# Patient Record
Sex: Male | Born: 1990 | Race: White | Hispanic: No | Marital: Married | Smoking: Former smoker
Health system: Southern US, Community
[De-identification: ages and names within clinical notes are randomized; demographics above are authoritative.]

## PROBLEM LIST (undated history)

## (undated) DIAGNOSIS — M6282 Rhabdomyolysis: Secondary | ICD-10-CM

## (undated) DIAGNOSIS — Z8639 Personal history of other endocrine, nutritional and metabolic disease: Secondary | ICD-10-CM

## (undated) DIAGNOSIS — F32A Depression, unspecified: Secondary | ICD-10-CM

## (undated) DIAGNOSIS — I10 Essential (primary) hypertension: Secondary | ICD-10-CM

## (undated) DIAGNOSIS — F329 Major depressive disorder, single episode, unspecified: Secondary | ICD-10-CM

## (undated) DIAGNOSIS — T50901A Poisoning by unspecified drugs, medicaments and biological substances, accidental (unintentional), initial encounter: Secondary | ICD-10-CM

## (undated) DIAGNOSIS — R579 Shock, unspecified: Secondary | ICD-10-CM

## (undated) DIAGNOSIS — R569 Unspecified convulsions: Secondary | ICD-10-CM

## (undated) DIAGNOSIS — J9690 Respiratory failure, unspecified, unspecified whether with hypoxia or hypercapnia: Secondary | ICD-10-CM

## (undated) DIAGNOSIS — F419 Anxiety disorder, unspecified: Secondary | ICD-10-CM

## (undated) DIAGNOSIS — I214 Non-ST elevation (NSTEMI) myocardial infarction: Secondary | ICD-10-CM

## (undated) DIAGNOSIS — J189 Pneumonia, unspecified organism: Secondary | ICD-10-CM

## (undated) DIAGNOSIS — G4730 Sleep apnea, unspecified: Secondary | ICD-10-CM

## (undated) DIAGNOSIS — Z789 Other specified health status: Secondary | ICD-10-CM

## (undated) DIAGNOSIS — F909 Attention-deficit hyperactivity disorder, unspecified type: Secondary | ICD-10-CM

## (undated) HISTORY — DX: Other specified health status: Z78.9

## (undated) HISTORY — PX: NO PAST SURGERIES: SHX2092

## (undated) HISTORY — DX: Sleep apnea, unspecified: G47.30

## (undated) HISTORY — DX: Depression, unspecified: F32.A

## (undated) HISTORY — DX: Attention-deficit hyperactivity disorder, unspecified type: F90.9

## (undated) HISTORY — DX: Anxiety disorder, unspecified: F41.9

## (undated) HISTORY — PX: MANDIBLE FRACTURE SURGERY: SHX706

---
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
## 2011-04-08 ENCOUNTER — Inpatient Hospital Stay (HOSPITAL_COMMUNITY)
Admission: EM | Admit: 2011-04-08 | Discharge: 2011-04-10 | DRG: 918 | Disposition: A | Discharge status: Home / Self Care | Payer: Medicaid Other | Attending: Internal Medicine | Admitting: Internal Medicine

## 2011-04-08 ENCOUNTER — Inpatient Hospital Stay (HOSPITAL_COMMUNITY): Payer: Medicaid Other

## 2011-04-08 DIAGNOSIS — F1010 Alcohol abuse, uncomplicated: Secondary | ICD-10-CM | POA: Diagnosis present

## 2011-04-08 DIAGNOSIS — F1210 Cannabis abuse, uncomplicated: Secondary | ICD-10-CM | POA: Diagnosis present

## 2011-04-08 DIAGNOSIS — E876 Hypokalemia: Secondary | ICD-10-CM | POA: Diagnosis present

## 2011-04-08 DIAGNOSIS — R4182 Altered mental status, unspecified: Secondary | ICD-10-CM | POA: Diagnosis present

## 2011-04-08 DIAGNOSIS — F17200 Nicotine dependence, unspecified, uncomplicated: Secondary | ICD-10-CM | POA: Diagnosis present

## 2011-04-08 DIAGNOSIS — R000 Tachycardia, unspecified: Secondary | ICD-10-CM | POA: Diagnosis present

## 2011-04-08 DIAGNOSIS — F1110 Opioid abuse, uncomplicated: Secondary | ICD-10-CM | POA: Diagnosis present

## 2011-04-08 DIAGNOSIS — R45851 Suicidal ideations: Secondary | ICD-10-CM

## 2011-04-08 DIAGNOSIS — T443X1A Poisoning by other parasympatholytics [anticholinergics and antimuscarinics] and spasmolytics, accidental (unintentional), initial encounter: Principal | ICD-10-CM | POA: Diagnosis present

## 2011-04-08 LAB — COMPREHENSIVE METABOLIC PANEL
Albumin: 3.9 g/dL (ref 3.5–5.2)
BUN: 12 mg/dL (ref 6–23)
Calcium: 9.1 mg/dL (ref 8.4–10.5)
GFR calc Af Amer: >60 mL/min (ref >60)
Glucose, Bld: 127 mg/dL — ABNORMAL HIGH (ref 70–99)
Total Protein: 7.1 g/dL (ref 6.0–8.3)

## 2011-04-08 LAB — DIFFERENTIAL
Eosinophils Absolute: 0.3 10*3/uL (ref 0.0–0.7)
Lymphs Abs: 3.4 10*3/uL (ref 0.7–4.0)
Monocytes Absolute: 1.4 10*3/uL — ABNORMAL HIGH (ref 0.1–1.0)
Monocytes Relative: 14 % — ABNORMAL HIGH (ref 3–12)
Neutrophils Relative %: 47 % (ref 43–77)

## 2011-04-08 LAB — RAPID URINE DRUG SCREEN, HOSP PERFORMED
Amphetamines: NOT DETECTED
Barbiturates: NOT DETECTED
Cocaine: NOT DETECTED
Opiates: NOT DETECTED
Tetrahydrocannabinol: POSITIVE — AB

## 2011-04-08 LAB — CBC
Hemoglobin: 14.1 g/dL (ref 13.0–17.0)
MCH: 27.2 pg (ref 26.0–34.0)
MCHC: 35 g/dL (ref 30.0–36.0)
MCV: 77.6 fL — ABNORMAL LOW (ref 78.0–100.0)
Platelets: 275 10*3/uL (ref 150–400)
RBC: 5.19 MIL/uL (ref 4.22–5.81)

## 2011-04-08 LAB — URINALYSIS, ROUTINE W REFLEX MICROSCOPIC
Bilirubin Urine: NEGATIVE
Glucose, UA: NEGATIVE mg/dL
Hgb urine dipstick: NEGATIVE
Ketones, ur: NEGATIVE mg/dL
Protein, ur: NEGATIVE mg/dL
Urobilinogen, UA: 1 mg/dL (ref 0.0–1.0)

## 2011-04-08 LAB — MRSA PCR SCREENING: MRSA by PCR: NEGATIVE

## 2011-04-08 LAB — CK: Total CK: 708 U/L — ABNORMAL HIGH (ref 7–232)

## 2011-04-08 LAB — SALICYLATE LEVEL: Salicylate Lvl: <2 mg/dL — ABNORMAL LOW (ref 2.8–20.0)

## 2011-04-08 LAB — ETHANOL: Alcohol, Ethyl (B): <11 mg/dL (ref 0–11)

## 2011-04-09 LAB — COMPREHENSIVE METABOLIC PANEL
ALT: 14 U/L (ref 0–53)
AST: 24 U/L (ref 0–37)
Albumin: 3.6 g/dL (ref 3.5–5.2)
Calcium: 9.3 mg/dL (ref 8.4–10.5)
GFR calc Af Amer: >60 mL/min (ref >60)
Glucose, Bld: 105 mg/dL — ABNORMAL HIGH (ref 70–99)
Sodium: 139 mEq/L (ref 135–145)
Total Protein: 6.9 g/dL (ref 6.0–8.3)

## 2011-04-09 LAB — CBC
MCH: 26.8 pg (ref 26.0–34.0)
MCHC: 34.2 g/dL (ref 30.0–36.0)
Platelets: 341 10*3/uL (ref 150–400)
RDW: 13.6 % (ref 11.5–15.5)

## 2011-04-10 ENCOUNTER — Emergency Department (HOSPITAL_COMMUNITY)
Admission: EM | Admit: 2011-04-10 | Discharge: 2011-04-11 | Disposition: A | Discharge status: Other Acute Inpatient Hospital | Payer: Medicaid Other | Attending: Emergency Medicine | Admitting: Emergency Medicine

## 2011-04-10 DIAGNOSIS — R404 Transient alteration of awareness: Secondary | ICD-10-CM | POA: Insufficient documentation

## 2011-04-10 DIAGNOSIS — Z046 Encounter for general psychiatric examination, requested by authority: Secondary | ICD-10-CM | POA: Insufficient documentation

## 2011-04-10 DIAGNOSIS — F4310 Post-traumatic stress disorder, unspecified: Secondary | ICD-10-CM

## 2011-04-10 LAB — DIFFERENTIAL
Eosinophils Relative: 1 % (ref 0–5)
Lymphs Abs: 3 10*3/uL (ref 0.7–4.0)
Monocytes Relative: 12 % (ref 3–12)
Smear Review: ADEQUATE

## 2011-04-10 LAB — CBC
Hemoglobin: 16.3 g/dL (ref 13.0–17.0)
MCH: 26.2 pg (ref 26.0–34.0)
RBC: 6.21 MIL/uL — ABNORMAL HIGH (ref 4.22–5.81)

## 2011-04-10 LAB — BASIC METABOLIC PANEL
CO2: 27 mEq/L (ref 19–32)
Chloride: 100 mEq/L (ref 96–112)
Sodium: 136 mEq/L (ref 135–145)

## 2011-04-10 LAB — RAPID URINE DRUG SCREEN, HOSP PERFORMED
Amphetamines: NOT DETECTED
Benzodiazepines: POSITIVE — AB
Cocaine: NOT DETECTED
Opiates: POSITIVE — AB
Tetrahydrocannabinol: NOT DETECTED

## 2011-04-10 LAB — ETHANOL: Alcohol, Ethyl (B): <11 mg/dL (ref 0–11)

## 2011-04-10 NOTE — Discharge Summary (Signed)
  NAMEJARET, [name]                  ACCOUNT NO.:  [id number]  MEDICAL RECORD NO.:  [id number]  LOCATION:  5522                         FACILITY:  [facility]  PHYSICIAN:  Jonny Ruiz, MD    DATE OF BIRTH:  1991/01/22  DATE OF ADMISSION:  04/08/2011 DATE OF DISCHARGE:  04/10/2011                              DISCHARGE SUMMARY   DISCHARGE DIAGNOSES: 1. Drug overdose. 2. Polysubstance abuse, likely anticholinergic toxicity. 3. Mild hypokalemia.  TESTS AND PROCEDURES: 1. CT scan of the head, noncontrast, normal. 2. Chest x-ray negative. 3. Urine drug screen positive for marijuana. 4. TSH normal. 5. Total CK 708.  REASON FOR HOSPITALIZATION:  For complete details of admission, please refer to the history and physical.  Briefly, the patient is a 20 year old with a history of polysubstance abuse, including marihuana, heroin,alcohol and benzos who was admitted to the hospital for drug overdose, likely anticholinergic toxicity.  HOSPITAL COURSE:  The patient was extremely agitated and with severe hypertension and was having some suicidal ideas.  He was admitted to the step-down unit and he was placed on restraint and suicidal precautions. He was managed with benzodiazepine.  He had a potassium of 3.3 and this was replaced.  The patient eventually calmed down and his blood pressure normalized.  He was then transferred to a regular medical floor where he was evaluated about his substance abuse by a counsellor as well as psychiatry.  He was offered the option to get detox at [facility] and then be transferred to [facility].  The patient refused and wanted to go home and from there, he wants to go to [facility].  He was told by the psychiatrist that it was going to be very difficult for him to deal with the withdrawal symptoms and the best option was to go to [facility] and then [facility] but the patient did not want to be admitted in Behavioral Health at this time and psychiatry did not  feel that involuntary commitment was appropriate.  The patient will be released to home, where he lives with his mother.  DISCHARGE CONDITION:  Stable.  DISCHARGE MEDICATIONS:  None.          ______________________________ Jonny Ruiz, MD     GL/MEDQ  D:  04/10/2011  T:  04/10/2011  Job:  [id number]  Electronically Signed by Jonny Ruiz MD on 04/10/2011 04:59:05 PM

## 2011-04-10 NOTE — Consult Note (Signed)
[name], [name]                  ACCOUNT NO.:  [id number]  MEDICAL RECORD NO.:  [id number]  LOCATION:  5522                         FACILITY:  [facility]  PHYSICIAN:  Eulogio Ditch, MD DATE OF BIRTH:  05/26/1991  DATE OF CONSULTATION:  04/10/2011 DATE OF DISCHARGE:                                CONSULTATION   REASON FOR CONSULT:  Polysubstance abuse, suicidal ideations.  HISTORY OF PRESENT ILLNESS:  A 20 year old male with a history of heroin abuse since the age of 53.  The patient reported he uses heroin almost every day.  At a maximum, he has used is up to half a gram.  The patient also injects heroin whenever he can get it.  The patient also reported abusing cocaine and alcohol on and off.  The patient was on methadone program, but he could not afford the program as he was not having transportation to go to the service.  The patient reported depressed mood, but denied suicidal ideations.  The patient is currently not in any psychiatric or counseling in the outpatient setting.  The patient denied any history of suicide attempt in the past.  The patient is logical and goal directed during the interview.  Denies hearing voices, but reported sometimes he hears a voice of the father yelling on him.  The patient also has gone to the jail because of the drug issue and taking probation in the past.  CURRENT MEDICAL HISTORY:  The patient has no active medical issue.  ALLERGIES:  No known drug allergies.  SOCIAL HISTORY:  The patient is currently living with the mother and has a 28-month-old baby boy.  FAMILY PSYCH HISTORY:  The patient's mother has a history of bipolar and drug abuse and she was admitted in [facility] and [facility] in the past.  MENTAL STATUS EXAM:  The patient is calm, cooperative during interview. Fair eye contact.  Speech normal in rate, rhythm, and volume.  Mood anxious.  Affect, mood congruent.  Thought process logical and goal directed.  Not suicidal or  homicidal, not delusional, not internally preoccupied, very logical and redirectable during the interview.  Denies hearing command or hallucinations.  On asking about suicidal ideation and how he was brought to the hospital, the patient reported that he took two big pills of Seroquel, which he helped the stranger to get prescription from the pharmacy as he paid the $3 co-pay, and he was unable to sleep and that is why he took 2 pills as the stranger gave him few pills because he helped him in getting the prescription.  On asking how he know that Seroquel is used for insomnia, he told us that when he was in the prison, that medication was given for insomnia and his mother also uses it for insomnia.  Cognition:  Alert, awake, oriented x3. Memory:  Immediate, recent, remote fair.  Attention and concentration fair.  Abstraction ability fair.  Insight and judgment fair.  DIAGNOSES:  Axis I:  Polysubstance dependence, mood disorder, not otherwise specified. Axis II:  Deferred. Axis III:  See medical notes. Axis IV:  Chronic substance abuse. Axis V:  50.  RECOMMENDATIONS: 1. Different treatment options discussed with  the patient.  I gave the     option to the patient to get detox at [facility] and then he     can be transferred to [facility], but not for inpatient rehab.  The     patient at this time wants to go home and from there, he wants to     go to the [facility].  He told us that he went one time in the past, but     as his girlfriend was pregnant, he did not give the consent for     admission and he left from there with the mother.  Again, I told     him that it is very difficult for him to deal with the withdrawal     symptoms, so the best option for him is to go to [facility]     and from there to [facility], but the patient do not want to be admitted     at [facility] at this time. 2. As this patient do not meet criteria for IVC, I will not admit the     patient forcefully  at [facility].  The patient will be given appointment for the [facility] as he wants to go home and then go to [facility].  Thanks for involving me in taking care of this patient.     Eulogio Ditch, MD     SA/MEDQ  D:  04/10/2011  T:  04/10/2011  Job:  [id number]  Electronically Signed by Eulogio Ditch  on 04/10/2011 03:32:37 PM

## 2011-04-11 ENCOUNTER — Inpatient Hospital Stay (HOSPITAL_COMMUNITY)
Admission: RE | Admit: 2011-04-11 | Discharge: 2011-04-14 | DRG: 897 | Disposition: A | Discharge status: Home / Self Care | Payer: PRIVATE HEALTH INSURANCE | Source: Ambulatory Visit | Attending: Psychiatry | Admitting: Psychiatry

## 2011-04-11 DIAGNOSIS — Z6379 Other stressful life events affecting family and household: Secondary | ICD-10-CM

## 2011-04-11 DIAGNOSIS — Z5900 Homelessness unspecified: Secondary | ICD-10-CM

## 2011-04-11 DIAGNOSIS — F1120 Opioid dependence, uncomplicated: Principal | ICD-10-CM

## 2011-04-11 DIAGNOSIS — F1320 Sedative, hypnotic or anxiolytic dependence, uncomplicated: Secondary | ICD-10-CM

## 2011-04-11 DIAGNOSIS — D72829 Elevated white blood cell count, unspecified: Secondary | ICD-10-CM

## 2011-04-12 DIAGNOSIS — F1120 Opioid dependence, uncomplicated: Secondary | ICD-10-CM

## 2011-04-12 DIAGNOSIS — F1320 Sedative, hypnotic or anxiolytic dependence, uncomplicated: Secondary | ICD-10-CM

## 2011-04-12 LAB — COMPREHENSIVE METABOLIC PANEL
Calcium: 9.6 mg/dL (ref 8.4–10.5)
Chloride: 103 mEq/L (ref 96–112)
Creatinine, Ser: 1.04 mg/dL (ref 0.50–1.35)
GFR calc Af Amer: >60 mL/min (ref >60)
GFR calc non Af Amer: >60 mL/min (ref >60)
Potassium: 3.9 mEq/L (ref 3.5–5.1)
Sodium: 139 mEq/L (ref 135–145)

## 2011-04-12 LAB — CBC
MCH: 25.7 pg — ABNORMAL LOW (ref 26.0–34.0)
MCHC: 32.5 g/dL (ref 30.0–36.0)
Platelets: 334 10*3/uL (ref 150–400)

## 2011-04-26 NOTE — H&P (Signed)
  [name], [name]                  ACCOUNT NO.:  [id number]  MEDICAL RECORD NO.:  [id number]  LOCATION:  [facility]                         FACILITY:  [facility]  PHYSICIAN:  Houston Siren, MD           DATE OF BIRTH:  1990-10-25  DATE OF ADMISSION:  04/08/2011 DATE OF DISCHARGE:                             HISTORY & PHYSICAL   PRIMARY CARE PHYSICIAN:  None.  REASON FOR ADMISSION:  Altered mental status likely toxicity from polysubstance abuse.  ADVANCE DIRECTIVE:  Full code.  HISTORY OF PRESENT ILLNESS:  This is a 20 year old male with benign past medical history, polysubstance abuser who was using heroin, taking Seroquel and methadone, presents to the emergency room with altered mental status, agitated with muscle twitching.  In the emergency room, he was also given Cogentin and 75 mg of Benadryl in addition to his Benadryl taking prior.  He was thrashing around quite agitated, unable to give any meaningful history.  Evaluation shows that he is tachycardic and hypertensive with systolic blood pressure of 146/120 and heart rate of 150.  Further evaluation shows normal level of aspirin (less than 2) a CPK of 700, alcohol level less than 11, normal creatinine and electrolytes, normal liver function tests.  His Tylenol level is less than 15 and he has a normal INR.  Urinary drug screen showed the presence of marijuana.  Hospitalist was asked to admit him for altered mental status and agitation.  PAST MEDICAL HISTORY:  Benign.  CHRONIC MEDICATION:  He was taking Seroquel and Benadryl.  ALLERGIES:  No known drug allergies.  SOCIAL HISTORY:  Unable to complete but he is known to be a smoker, heavy drinker, use of benzodiazepine along with heroin and opiate abuse.  REVIEW OF SYSTEMS:  Unable.  PHYSICAL EXAMINATION:  VITAL SIGNS:  Blood pressure 146/122, pulse of 110 now, respiratory rate of 20, temperature 97.5. GENERAL:  He is quite agitated. HEENT:  His pupil was dilated but reactive.   He has facial symmetry with his grimace.  No stridor. CARDIAC:  S1-S2 regular and tachycardic. LUNGS:  Clear. ABDOMEN:  Soft, nondistended, nontender. EXTREMITIES:  No edema.  He is hyperreflexic bilaterally and moving all extremity. NEUROLOGICAL:  Certainly suboptimal due to his condition. PSYCHIATRIC:  Certainly suboptimal due to his condition.  OBJECTIVE FINDINGS:  As above.  IMPRESSION:  A 20 year old with polysubstance abuse, likely having anticholinergic toxicity since he is taking Seroquel, heroin, and was given Benadryl and Cogentin in the emergency room as well.  We will admit him to the step-down, may have to use restrain to protect himself. We will use benzodiazepine as needed.  We would definitely avoid phenothiazine for sedation.  He should do well with supportive care alone.  We will admit him to [facility] Team 5.  We will try to avoid physostigmine and as noted, I suspect that he will get better over time.     Houston Siren, MD     PL/MEDQ  D:  04/08/2011  T:  04/08/2011  Job:  [id number]  Electronically Signed by Houston Siren  on 04/26/2011 02:19:05 AM

## 2011-05-02 NOTE — Discharge Summary (Signed)
NAMEMERLIN, GOLDEN                  ACCOUNT NO.:  [id number]  MEDICAL RECORD NO.:  [id number]  LOCATION:  0306                          [facility]ACILITY:  [facility]  PHYSICIAN:  Orson Aloe, MD       DATE OF BIRTH:  07-08-1991  DATE OF ADMISSION:  04/11/2011 DATE OF DISCHARGE:  04/14/2011                              DISCHARGE SUMMARY   IDENTIFYING INFORMATION:  This is a 20 year old Caucasian male, single. This is a voluntary admission.  HISTORY OF PRESENT ILLNESS:  First [facility] admission for [name] who goes by the name [name], "who presented himself requesting detox from heroin." He has been using about half a gram, about $90 worth of heroin every day and is injecting it IV.  He presented earlier in the week after taking a handful of Seroquel tablets of unknown mg that were offered to him by a stranger after he gave this person $3 dollars to get his prescription filled.  He said he was planning on getting some sleep since he was trying to come off the heroin and had been unable to sleep.  He became confused, agitated and describes himself as allergic to the Seroquel. He has also been taking Ativan 1 mg every 6 hours on prescriptions he has received from the emergency room and other various places, to control his anxiety and has been on some type of by benzodiazepine since February 2012 when he got out of prison where he stayed for vehicle theft.  At that time, he had been taking Klonopin 2 mg q.6 h pretty regularly until several weeks ago when he started Ativan.  He has categorically denied any suicidal thoughts and would like to be clean from drugs.  His mother is an inspiration for him since she has been clean and sober for several years.  PAST PSYCHIATRIC HISTORY:  No previous formal treatment for substance abuse.  He was in jail from age 68 until February 2012 for felony theft of a motor vehicle and has been living between his mom's house and a girlfriend's house in various places.  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  FAMILY HISTORY:  Father with history of substance abuse.  MEDICAL EVALUATION:  He was medically evaluated in the emergency room and had earlier been treated at [facility] emergency room for delirium after the ingestion of Seroquel.  Calm, cooperative.  His initial alcohol screen was negative.  Normal chemistries:  BUN 12, creatinine 1.33.  CBC revealed a mildly elevated white count of 17.8 with no focal symptoms and a hemoglobin 16.3.  Urine drug screen was positive for opiates and benzodiazepines.  COURSE OF HOSPITALIZATION:  He was admitted to our dual diagnosis unit and given a working diagnosis of opiate abuse and dependence, rule out benzodiazepine dependence and an AXIS III of leukocytosis NOS.  He was started on clonidine detox protocol with a goal of a safe detox in 5 days, and on a Librium detox protocol for safe detox from benzodiazepines.  His participation in group and unit activities was consistently appropriate, polite and satisfactory throughout his stay here.  He did not  display any acute withdrawal symptoms, though he complained of myalgias primarily in his knees.  He repeatedly expressed his goal of wanting to be clean and sober off all drugs.  Hepatic function was checked.  SGOT 12, SGPT 15, alkaline phosphatase 57 and total bilirubin 0.5.  Repeat CBC revealed WBC 12.0, hemoglobin 13.9, hematocrit 42.8 and platelets 334,000.  He remained afebrile and without any signs of infection throughout his stay here and it was felt his leukocytosis was physiological.  By Monday, July 16, he was ready for discharge as mother agreed to transport him to the [facility] program, and he was looking forward to this.  He continued to clearly and convincingly deny any dangerous thoughts and looked forward to the [facility] program.  DISCHARGE/PLAN:  Discharged to [facility] today.  Mother will transport.  DISCHARGE  MEDICATIONS: 1. Naprosyn 500 mg b.i.d. p.r.n. for myalgias. 2. Methocarbamol 500 mg q.i.d. p.r.n. for muscle spasms. 3. Clonidine 0.1 mg b.i.d. on July 16 and 17, and 0.1 mg daily on July     18 and 19, and opiate detox will be completed. 4. Librium 25 mg one cap b.i.d. July 16 and one daily on July 17, and     benzodiazepine detox is completed.     [name], N.P.   ______________________________ Orson Aloe, MD    MAS/MEDQ  D:  04/14/2011  T:  04/14/2011  Job:  [id number]  Electronically Signed by [name] N.P. on 04/15/2011 11:26:07 AM Electronically Signed by Orson Aloe  on 05/02/2011 08:51:47 AM

## 2011-05-02 NOTE — Assessment & Plan Note (Signed)
[name], [name]                  ACCOUNT NO.:  [id number]  MEDICAL RECORD NO.:  [id number]  LOCATION:  0306                          FACILITY:  [facility]  PHYSICIAN:  Orson Aloe, MD       DATE OF BIRTH:  Aug 23, 1991  DATE OF ADMISSION:  04/11/2011 DATE OF DISCHARGE:                      PSYCHIATRIC ADMISSION ASSESSMENT   DATE OF ASSESSMENT:  April 12, 2011, at 8:00 a.m.  IDENTIFYING INFORMATION:  This is a 20 year old, single, Caucasian male. This is a voluntary admission.  HISTORY OF PRESENT ILLNESS:  Authur presents on referral from Dr. Eulogio Ditch for detox from heroin.  He has been using about 0.5 gram a day or about 90 dollars every day.  Last week he took several tablets of Seroquel which he had gotten off the street and reported that he became very confused, agitated, and ended up in the [facility] emergency room. He said that he took it because someone offered to him, he was hoping to get some sleep off it, but did not intend to kill himself.  He has also been using Ativan about 1 mg q.6 hours.  He says that he takes it regularly  on a prescription that he received from the emergency room to control his anxiety.  He has been on some type of benzodiazepine since February 2012 when he got out of prison.  He started off taking clonazepam off the street and then got a prescription for 2 mg q.6 hours which he had been taking pretty regularly until several weeks ago when he started taking Ativan which he says he has been taking under various prescription sources.  He denies any abuse of pain pills.  Denies abuse of hallucinogenics.  Last use of heroin was yesterday and on initial presentation was noted to be nodding off when they were attempting to assess him.  He is fully alert today and denies any suicidal thoughts. His mother has arranged for him to be admitted to the [facility] program in [place], [place] and he has a bed approved for 10:00 a.m. on Monday, July  16th.  PAST PSYCHIATRIC HISTORY:  No previous formal treatment for substance abuse.  He was in jail from age 66 until February 2012 for felony theft of a motor vehicle.  He has been living from [facility] and girlfriend's house and various places.  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  No current legal problems.  FAMILY HISTORY:  Father with history of substance abuse.  MEDICAL HISTORY:  No regular primary care provider.  CURRENT MEDICAL PROBLEMS:  None.  PAST MEDICAL HISTORY:  Seizure in 2010 in association with the use of naloxone no while high on heroin.  CURRENT MEDICATIONS:  Ativan 1 to 2 mg q.6 hours p.r.n. for anxiety.  ALLERGIES:  None.  He refers to Seroquel as an allergy but on exam he actually became delirious when abusing the Seroquel.  POSITIVE PHYSICAL FINDINGS:  This is a slim built, normally-developed Caucasian male, in full contact with reality.  Normal motor exam.  No abnormal movements.  Cognition fully intact.  ADMITTING VITAL SIGNS:  Temperature 98.4, pulse 105, respirations 16, blood pressure 101/67.  Subjectively he reports today that he is feeling pretty rough, experiencing some withdrawal from his Ativan.  Objectively he does not appear to be in any acute withdrawal.  DIAGNOSTIC STUDIES:  Were done in the emergency room.  Alcohol level less than 11.  Chemistry is normal.  BUN 12, creatinine 1.33.  CBC reveals mildly elevated white count of 17.8, hemoglobin 16.3, hematocrit 49.5, platelets 401,000.  Urine drug screen positive for opiates and benzodiazepines.  MENTAL STATUS EXAM:  This is a fully alert male, cooperative, pleasant, calm, composed, non-pressured speech, remote and working memory are intact.  Insight partial, superficial.  Judgment fair.  Impulse control fair.  Thinking is nonpsychotic.  Mood is neutral, denying any suicidal or homicidal thoughts.  He has plans in place to go to [facility] program. Asks appropriate  questions about the program.  AXIS I:  Opiate abuse and dependence, rule out benzodiazepine dependence. AXIS II:  Deferred. AXIS III:  Leukocytosis not otherwise specified. AXIS IV:  Significant social issues including homelessness. AXIS V:  Current 48.  Past year not known.  PLAN: 1. Voluntarily admit him with a goal of safe withdrawal. 2. We started him on an opiate detox program using clonidine and will     start him on a Librium protocol for safe withdrawal from the     benzodiazepines. 3. His white count was 17.5, but he is afebrile and completely     asymptomatic. 4. We will recheck a CBC and will check a CMET to check his liver, and     recheck his potassium.     Margaret A. Lorin Picket, N.P.   ______________________________ Orson Aloe, MD    MAS/MEDQ  D:  04/12/2011  T:  04/12/2011  Job:  [id number]  Electronically Signed by Kari Baars N.P. on 04/14/2011 08:17:19 AM Electronically Signed by Orson Aloe  on 05/02/2011 08:51:57 AM

## 2011-05-31 DIAGNOSIS — I214 Non-ST elevation (NSTEMI) myocardial infarction: Secondary | ICD-10-CM

## 2011-05-31 HISTORY — DX: Non-ST elevation (NSTEMI) myocardial infarction: I21.4

## 2011-06-05 ENCOUNTER — Emergency Department (HOSPITAL_COMMUNITY): Payer: Medicaid Other

## 2011-06-05 ENCOUNTER — Inpatient Hospital Stay (HOSPITAL_COMMUNITY): Payer: Medicaid Other

## 2011-06-05 ENCOUNTER — Inpatient Hospital Stay (HOSPITAL_COMMUNITY)
Admission: EM | Admit: 2011-06-05 | Discharge: 2011-06-15 | DRG: 917 | Disposition: A | Discharge status: Home / Self Care | Payer: Medicaid Other | Source: Ambulatory Visit | Attending: Pulmonary Disease | Admitting: Pulmonary Disease

## 2011-06-05 DIAGNOSIS — E860 Dehydration: Secondary | ICD-10-CM | POA: Diagnosis present

## 2011-06-05 DIAGNOSIS — J690 Pneumonitis due to inhalation of food and vomit: Secondary | ICD-10-CM | POA: Diagnosis not present

## 2011-06-05 DIAGNOSIS — R4020 Unspecified coma: Secondary | ICD-10-CM | POA: Diagnosis present

## 2011-06-05 DIAGNOSIS — T43601A Poisoning by unspecified psychostimulants, accidental (unintentional), initial encounter: Secondary | ICD-10-CM | POA: Diagnosis present

## 2011-06-05 DIAGNOSIS — T405X1A Poisoning by cocaine, accidental (unintentional), initial encounter: Secondary | ICD-10-CM | POA: Diagnosis present

## 2011-06-05 DIAGNOSIS — R579 Shock, unspecified: Secondary | ICD-10-CM | POA: Diagnosis present

## 2011-06-05 DIAGNOSIS — T424X4A Poisoning by benzodiazepines, undetermined, initial encounter: Secondary | ICD-10-CM | POA: Diagnosis present

## 2011-06-05 DIAGNOSIS — E874 Mixed disorder of acid-base balance: Secondary | ICD-10-CM | POA: Diagnosis present

## 2011-06-05 DIAGNOSIS — T424X1A Poisoning by benzodiazepines, accidental (unintentional), initial encounter: Secondary | ICD-10-CM | POA: Diagnosis present

## 2011-06-05 DIAGNOSIS — E875 Hyperkalemia: Secondary | ICD-10-CM | POA: Diagnosis present

## 2011-06-05 DIAGNOSIS — J9600 Acute respiratory failure, unspecified whether with hypoxia or hypercapnia: Secondary | ICD-10-CM | POA: Diagnosis present

## 2011-06-05 DIAGNOSIS — T401X4A Poisoning by heroin, undetermined, initial encounter: Principal | ICD-10-CM | POA: Diagnosis present

## 2011-06-05 DIAGNOSIS — M6282 Rhabdomyolysis: Secondary | ICD-10-CM | POA: Diagnosis present

## 2011-06-05 DIAGNOSIS — I214 Non-ST elevation (NSTEMI) myocardial infarction: Secondary | ICD-10-CM | POA: Diagnosis present

## 2011-06-05 DIAGNOSIS — T401X1A Poisoning by heroin, accidental (unintentional), initial encounter: Secondary | ICD-10-CM | POA: Diagnosis present

## 2011-06-05 DIAGNOSIS — F1120 Opioid dependence, uncomplicated: Secondary | ICD-10-CM | POA: Diagnosis present

## 2011-06-05 DIAGNOSIS — Z781 Physical restraint status: Secondary | ICD-10-CM | POA: Diagnosis not present

## 2011-06-05 DIAGNOSIS — F1410 Cocaine abuse, uncomplicated: Secondary | ICD-10-CM | POA: Diagnosis present

## 2011-06-05 DIAGNOSIS — N179 Acute kidney failure, unspecified: Secondary | ICD-10-CM

## 2011-06-05 DIAGNOSIS — F1310 Sedative, hypnotic or anxiolytic abuse, uncomplicated: Secondary | ICD-10-CM | POA: Diagnosis present

## 2011-06-05 LAB — POCT I-STAT 3, ART BLOOD GAS (G3+)
Acid-base deficit: 7 mmol/L — ABNORMAL HIGH (ref 0.0–2.0)
Acid-base deficit: 14 mmol/L — ABNORMAL HIGH (ref 0.0–2.0)
Bicarbonate: 22.1 meq/L (ref 20.0–24.0)
Bicarbonate: 12.5 meq/L — ABNORMAL LOW (ref 20.0–24.0)
O2 Saturation: 85 %
O2 Saturation: 87 %
Patient temperature: 98.6
Patient temperature: 98.6
TCO2: 24 mmol/L (ref 0–100)
TCO2: 13 mmol/L (ref 0–100)
pCO2 arterial: 58.8 mmHg (ref 35.0–45.0)
pCO2 arterial: 32.1 mmHg — ABNORMAL LOW (ref 35.0–45.0)
pH, Arterial: 7.183 — CL (ref 7.350–7.450)
pH, Arterial: 7.199 — CL (ref 7.350–7.450)
pO2, Arterial: 62 mmHg — ABNORMAL LOW (ref 80.0–100.0)
pO2, Arterial: 63 mmHg — ABNORMAL LOW (ref 80.0–100.0)

## 2011-06-05 LAB — BASIC METABOLIC PANEL
BUN: 17 mg/dL (ref 6–23)
BUN: 16 mg/dL (ref 6–23)
Calcium: 6.6 mg/dL — ABNORMAL LOW (ref 8.4–10.5)
GFR calc Af Amer: >60 mL/min (ref >60)
GFR calc non Af Amer: 52 mL/min — ABNORMAL LOW (ref >60)
GFR calc non Af Amer: >60 mL/min (ref >60)
Glucose, Bld: 200 mg/dL — ABNORMAL HIGH (ref 70–99)
Potassium: >7.5 mEq/L (ref 3.5–5.1)
Sodium: 136 mEq/L (ref 135–145)

## 2011-06-05 LAB — BASIC METABOLIC PANEL WITH GFR
CO2: 22 meq/L (ref 19–32)
Calcium: 7.8 mg/dL — ABNORMAL LOW (ref 8.4–10.5)
Chloride: 98 meq/L (ref 96–112)
Creatinine, Ser: 1.68 mg/dL — ABNORMAL HIGH (ref 0.50–1.35)
Glucose, Bld: 143 mg/dL — ABNORMAL HIGH (ref 70–99)

## 2011-06-05 LAB — CARBOXYHEMOGLOBIN
Carboxyhemoglobin: 0.9 % (ref 0.5–1.5)
Methemoglobin: 0.8 % (ref 0.0–1.5)
O2 Saturation: 55.7 %
Total hemoglobin: 16.3 g/dL (ref 13.5–18.0)
Total hemoglobin: 16 g/dL (ref 13.5–18.0)

## 2011-06-05 LAB — URINALYSIS, ROUTINE W REFLEX MICROSCOPIC
Bilirubin Urine: NEGATIVE
Glucose, UA: 250 mg/dL — AB
Specific Gravity, Urine: 1.029 (ref 1.005–1.030)
Urobilinogen, UA: 1 mg/dL (ref 0.0–1.0)
pH: 6 (ref 5.0–8.0)

## 2011-06-05 LAB — CBC
HCT: 47.9 % (ref 39.0–52.0)
MCV: 81.5 fL (ref 78.0–100.0)
RDW: 13.8 % (ref 11.5–15.5)
WBC: 22.4 10*3/uL — ABNORMAL HIGH (ref 4.0–10.5)

## 2011-06-05 LAB — POCT I-STAT, CHEM 8
BUN: 21 mg/dL (ref 6–23)
Calcium, Ion: 0.94 mmol/L — ABNORMAL LOW (ref 1.12–1.32)
Chloride: 104 meq/L (ref 96–112)
Creatinine, Ser: 1.7 mg/dL — ABNORMAL HIGH (ref 0.50–1.35)
Glucose, Bld: 108 mg/dL — ABNORMAL HIGH (ref 70–99)
HCT: 53 % — ABNORMAL HIGH (ref 39.0–52.0)
Hemoglobin: 18 g/dL — ABNORMAL HIGH (ref 13.0–17.0)
Potassium: 6.2 mEq/L — ABNORMAL HIGH (ref 3.5–5.1)
Sodium: 137 mEq/L (ref 135–145)
TCO2: 22 mmol/L (ref 0–100)

## 2011-06-05 LAB — PRO B NATRIURETIC PEPTIDE: Pro B Natriuretic peptide (BNP): 2830 pg/mL — ABNORMAL HIGH (ref 0–125)

## 2011-06-05 LAB — URINE MICROSCOPIC-ADD ON

## 2011-06-05 LAB — APTT: aPTT: 27 s (ref 24–37)

## 2011-06-05 LAB — PHOSPHORUS: Phosphorus: 7.9 mg/dL — ABNORMAL HIGH (ref 2.3–4.6)

## 2011-06-05 LAB — MAGNESIUM: Magnesium: 2.3 mg/dL (ref 1.5–2.5)

## 2011-06-05 LAB — CORTISOL: Cortisol, Plasma: 19.4 ug/dL

## 2011-06-05 LAB — PROTIME-INR: Prothrombin Time: 15.1 seconds (ref 11.6–15.2)

## 2011-06-05 MED ORDER — IOHEXOL 300 MG/ML  SOLN
80.0000 mL | Freq: Once | INTRAMUSCULAR | Status: AC | PRN
  Administered 2011-06-05: 80 mL via INTRAVENOUS

## 2011-06-06 ENCOUNTER — Inpatient Hospital Stay (HOSPITAL_COMMUNITY): Payer: Medicaid Other

## 2011-06-06 DIAGNOSIS — R7989 Other specified abnormal findings of blood chemistry: Secondary | ICD-10-CM

## 2011-06-06 DIAGNOSIS — R0609 Other forms of dyspnea: Secondary | ICD-10-CM

## 2011-06-06 DIAGNOSIS — R0989 Other specified symptoms and signs involving the circulatory and respiratory systems: Secondary | ICD-10-CM

## 2011-06-06 DIAGNOSIS — I959 Hypotension, unspecified: Secondary | ICD-10-CM

## 2011-06-06 LAB — BLOOD GAS, ARTERIAL
Acid-base deficit: 0.3 mmol/L (ref 0.0–2.0)
Bicarbonate: 24.3 mEq/L — ABNORMAL HIGH (ref 20.0–24.0)
FIO2: 0.4 %
MECHVT: 580 mL
TCO2: 25.6 mmol/L (ref 0–100)
pCO2 arterial: 46.4 mmHg — ABNORMAL HIGH (ref 35.0–45.0)

## 2011-06-06 LAB — CARDIAC PANEL(CRET KIN+CKTOT+MB+TROPI)
CK, MB: 43.3 ng/mL (ref 0.3–4.0)
CK, MB: 51.5 ng/mL (ref 0.3–4.0)
Relative Index: 0.6 (ref 0.0–2.5)
Relative Index: 0.5 (ref 0.0–2.5)
Total CK: 7782 U/L — ABNORMAL HIGH (ref 7–232)
Total CK: 7439 U/L — ABNORMAL HIGH (ref 7–232)
Troponin I: 7.73 ng/mL (ref <0.30)
Troponin I: 0.73 ng/mL (ref <0.30)
Troponin I: 16.35 ng/mL (ref <0.30)

## 2011-06-06 LAB — URINE CULTURE
Colony Count: NO GROWTH
Culture  Setup Time: 201209062221
Culture: NO GROWTH

## 2011-06-06 LAB — BASIC METABOLIC PANEL
BUN: 15 mg/dL (ref 6–23)
CO2: 24 mEq/L (ref 19–32)
Chloride: 109 mEq/L (ref 96–112)
Creatinine, Ser: 1.42 mg/dL — ABNORMAL HIGH (ref 0.50–1.35)
GFR calc Af Amer: >60 mL/min (ref >60)
Glucose, Bld: 135 mg/dL — ABNORMAL HIGH (ref 70–99)
Potassium: 4 mEq/L (ref 3.5–5.1)

## 2011-06-06 LAB — CARBOXYHEMOGLOBIN
Carboxyhemoglobin: 1 % (ref 0.5–1.5)
Carboxyhemoglobin: 1.1 % (ref 0.5–1.5)
Methemoglobin: 1 % (ref 0.0–1.5)
Methemoglobin: 1 % (ref 0.0–1.5)
Total hemoglobin: 16.3 g/dL (ref 13.5–18.0)
Total hemoglobin: 16.5 g/dL (ref 13.5–18.0)

## 2011-06-06 LAB — POCT I-STAT 3, ART BLOOD GAS (G3+)
Acid-base deficit: 10 mmol/L — ABNORMAL HIGH (ref 0.0–2.0)
Bicarbonate: 17.4 mEq/L — ABNORMAL LOW (ref 20.0–24.0)
Bicarbonate: 21.1 mEq/L (ref 20.0–24.0)
Patient temperature: 37.2
pCO2 arterial: 42.2 mmHg (ref 35.0–45.0)
pH, Arterial: 7.226 — ABNORMAL LOW (ref 7.350–7.450)
pH, Arterial: 7.527 — ABNORMAL HIGH (ref 7.350–7.450)
pO2, Arterial: 131 mmHg — ABNORMAL HIGH (ref 80.0–100.0)
pO2, Arterial: 267 mmHg — ABNORMAL HIGH (ref 80.0–100.0)

## 2011-06-06 LAB — TYPE AND SCREEN
ABO/RH(D): B POS
Antibody Screen: NEGATIVE

## 2011-06-06 LAB — CBC
HCT: 44.9 % (ref 39.0–52.0)
MCH: 27.1 pg (ref 26.0–34.0)
MCV: 80 fL (ref 78.0–100.0)
Platelets: 286 10*3/uL (ref 150–400)
RBC: 5.61 MIL/uL (ref 4.22–5.81)
WBC: 22.9 10*3/uL — ABNORMAL HIGH (ref 4.0–10.5)

## 2011-06-06 LAB — LEGIONELLA ANTIGEN, URINE: Legionella Antigen, Urine: NEGATIVE

## 2011-06-06 LAB — ALCOHOL,  ISOPROPYL (ISOPROPANOL): Isopropanol: NEGATIVE

## 2011-06-06 LAB — GLUCOSE, CAPILLARY: Glucose-Capillary: 129 mg/dL — ABNORMAL HIGH (ref 70–99)

## 2011-06-06 LAB — HEPARIN LEVEL (UNFRACTIONATED): Heparin Unfractionated: 0.32 IU/mL (ref 0.30–0.70)

## 2011-06-07 ENCOUNTER — Inpatient Hospital Stay (HOSPITAL_COMMUNITY): Payer: Medicaid Other

## 2011-06-07 LAB — CARDIAC PANEL(CRET KIN+CKTOT+MB+TROPI)
Relative Index: 0.3 (ref 0.0–2.5)
Relative Index: 0.2 (ref 0.0–2.5)
Total CK: 3428 U/L — ABNORMAL HIGH (ref 7–232)
Troponin I: 4.88 ng/mL (ref <0.30)
Troponin I: 4.3 ng/mL (ref <0.30)

## 2011-06-07 LAB — CBC
HCT: 39.4 % (ref 39.0–52.0)
Hemoglobin: 13.3 g/dL (ref 13.0–17.0)
RDW: 13.9 % (ref 11.5–15.5)
WBC: 15.4 10*3/uL — ABNORMAL HIGH (ref 4.0–10.5)

## 2011-06-07 LAB — GLUCOSE, CAPILLARY
Glucose-Capillary: 135 mg/dL — ABNORMAL HIGH (ref 70–99)
Glucose-Capillary: 135 mg/dL — ABNORMAL HIGH (ref 70–99)
Glucose-Capillary: 141 mg/dL — ABNORMAL HIGH (ref 70–99)
Glucose-Capillary: 150 mg/dL — ABNORMAL HIGH (ref 70–99)

## 2011-06-07 LAB — BASIC METABOLIC PANEL
CO2: 30 mEq/L (ref 19–32)
GFR calc non Af Amer: >60 mL/min (ref >60)
Glucose, Bld: 142 mg/dL — ABNORMAL HIGH (ref 70–99)
Potassium: 3.3 mEq/L — ABNORMAL LOW (ref 3.5–5.1)
Sodium: 146 mEq/L — ABNORMAL HIGH (ref 135–145)

## 2011-06-07 LAB — POCT I-STAT 3, ART BLOOD GAS (G3+)
TCO2: 31 mmol/L (ref 0–100)
pCO2 arterial: 37.9 mmHg (ref 35.0–45.0)

## 2011-06-07 LAB — PHOSPHORUS: Phosphorus: 2.2 mg/dL — ABNORMAL LOW (ref 2.3–4.6)

## 2011-06-07 LAB — MAGNESIUM: Magnesium: 2.1 mg/dL (ref 1.5–2.5)

## 2011-06-07 LAB — HEPARIN LEVEL (UNFRACTIONATED): Heparin Unfractionated: 0.28 IU/mL — ABNORMAL LOW (ref 0.30–0.70)

## 2011-06-08 ENCOUNTER — Inpatient Hospital Stay (HOSPITAL_COMMUNITY): Payer: Medicaid Other

## 2011-06-08 DIAGNOSIS — T401X4A Poisoning by heroin, undetermined, initial encounter: Secondary | ICD-10-CM

## 2011-06-08 DIAGNOSIS — J9600 Acute respiratory failure, unspecified whether with hypoxia or hypercapnia: Secondary | ICD-10-CM

## 2011-06-08 DIAGNOSIS — R4020 Unspecified coma: Secondary | ICD-10-CM

## 2011-06-08 DIAGNOSIS — R579 Shock, unspecified: Secondary | ICD-10-CM

## 2011-06-08 LAB — CULTURE, BLOOD (ROUTINE X 2): Culture  Setup Time: 201209070156

## 2011-06-08 LAB — BASIC METABOLIC PANEL
Chloride: 113 mEq/L — ABNORMAL HIGH (ref 96–112)
GFR calc Af Amer: >60 mL/min (ref >60)
GFR calc non Af Amer: >60 mL/min (ref >60)
Potassium: 3.3 mEq/L — ABNORMAL LOW (ref 3.5–5.1)
Sodium: 147 mEq/L — ABNORMAL HIGH (ref 135–145)

## 2011-06-08 LAB — MAGNESIUM: Magnesium: 2 mg/dL (ref 1.5–2.5)

## 2011-06-08 LAB — POCT I-STAT 3, ART BLOOD GAS (G3+)
Acid-Base Excess: 5 mmol/L — ABNORMAL HIGH (ref 0.0–2.0)
Bicarbonate: 29 mEq/L — ABNORMAL HIGH (ref 20.0–24.0)
Patient temperature: 100.8
pH, Arterial: 7.431 (ref 7.350–7.450)

## 2011-06-08 LAB — GLUCOSE, CAPILLARY
Glucose-Capillary: 146 mg/dL — ABNORMAL HIGH (ref 70–99)
Glucose-Capillary: 136 mg/dL — ABNORMAL HIGH (ref 70–99)
Glucose-Capillary: 135 mg/dL — ABNORMAL HIGH (ref 70–99)
Glucose-Capillary: 118 mg/dL — ABNORMAL HIGH (ref 70–99)

## 2011-06-08 LAB — CBC
Hemoglobin: 11.9 g/dL — ABNORMAL LOW (ref 13.0–17.0)
Platelets: 213 10*3/uL (ref 150–400)
RBC: 4.38 MIL/uL (ref 4.22–5.81)
WBC: 12.7 10*3/uL — ABNORMAL HIGH (ref 4.0–10.5)

## 2011-06-08 LAB — PHOSPHORUS: Phosphorus: 3 mg/dL (ref 2.3–4.6)

## 2011-06-08 LAB — CULTURE, RESPIRATORY W GRAM STAIN

## 2011-06-08 LAB — HEPARIN LEVEL (UNFRACTIONATED): Heparin Unfractionated: 0.22 IU/mL — ABNORMAL LOW (ref 0.30–0.70)

## 2011-06-09 ENCOUNTER — Inpatient Hospital Stay (HOSPITAL_COMMUNITY): Payer: Medicaid Other

## 2011-06-09 DIAGNOSIS — I214 Non-ST elevation (NSTEMI) myocardial infarction: Secondary | ICD-10-CM

## 2011-06-09 DIAGNOSIS — I369 Nonrheumatic tricuspid valve disorder, unspecified: Secondary | ICD-10-CM

## 2011-06-09 LAB — BLOOD GAS, ARTERIAL
Bicarbonate: 33.1 mEq/L — ABNORMAL HIGH (ref 20.0–24.0)
FIO2: 0.4 %
MECHVT: 580 mL
PEEP: 5 cmH2O
TCO2: 34.5 mmol/L (ref 0–100)
pCO2 arterial: 45.2 mmHg — ABNORMAL HIGH (ref 35.0–45.0)
pH, Arterial: 7.478 — ABNORMAL HIGH (ref 7.350–7.450)

## 2011-06-09 LAB — BASIC METABOLIC PANEL
Calcium: 8.1 mg/dL — ABNORMAL LOW (ref 8.4–10.5)
GFR calc Af Amer: >60 mL/min (ref >60)
GFR calc non Af Amer: >60 mL/min (ref >60)
Glucose, Bld: 143 mg/dL — ABNORMAL HIGH (ref 70–99)
Potassium: 2.8 mEq/L — ABNORMAL LOW (ref 3.5–5.1)
Sodium: 145 mEq/L (ref 135–145)

## 2011-06-09 LAB — CBC
HCT: 38.6 % — ABNORMAL LOW (ref 39.0–52.0)
RDW: 14.3 % (ref 11.5–15.5)
WBC: 14.6 10*3/uL — ABNORMAL HIGH (ref 4.0–10.5)

## 2011-06-09 LAB — HEPARIN LEVEL (UNFRACTIONATED): Heparin Unfractionated: 0.38 IU/mL (ref 0.30–0.70)

## 2011-06-09 LAB — URINALYSIS, ROUTINE W REFLEX MICROSCOPIC
Bilirubin Urine: NEGATIVE
Ketones, ur: NEGATIVE mg/dL
Leukocytes, UA: NEGATIVE
Nitrite: NEGATIVE
Protein, ur: NEGATIVE mg/dL
Urobilinogen, UA: 1 mg/dL (ref 0.0–1.0)
pH: 5.5 (ref 5.0–8.0)

## 2011-06-09 LAB — MAGNESIUM: Magnesium: 2 mg/dL (ref 1.5–2.5)

## 2011-06-09 LAB — RAPID URINE DRUG SCREEN, HOSP PERFORMED: Barbiturates: NOT DETECTED

## 2011-06-09 LAB — VANCOMYCIN, TROUGH: Vancomycin Tr: 8.8 ug/mL — ABNORMAL LOW (ref 10.0–20.0)

## 2011-06-09 LAB — GLUCOSE, CAPILLARY: Glucose-Capillary: 135 mg/dL — ABNORMAL HIGH (ref 70–99)

## 2011-06-09 LAB — PHOSPHORUS: Phosphorus: 2.6 mg/dL (ref 2.3–4.6)

## 2011-06-10 ENCOUNTER — Inpatient Hospital Stay (HOSPITAL_COMMUNITY): Payer: Medicaid Other

## 2011-06-10 DIAGNOSIS — A419 Sepsis, unspecified organism: Secondary | ICD-10-CM

## 2011-06-10 DIAGNOSIS — T401X4A Poisoning by heroin, undetermined, initial encounter: Secondary | ICD-10-CM

## 2011-06-10 DIAGNOSIS — J9600 Acute respiratory failure, unspecified whether with hypoxia or hypercapnia: Secondary | ICD-10-CM

## 2011-06-10 DIAGNOSIS — J690 Pneumonitis due to inhalation of food and vomit: Secondary | ICD-10-CM

## 2011-06-10 DIAGNOSIS — R6521 Severe sepsis with septic shock: Secondary | ICD-10-CM

## 2011-06-10 LAB — CBC
HCT: 38.6 % — ABNORMAL LOW (ref 39.0–52.0)
Hemoglobin: 12.4 g/dL — ABNORMAL LOW (ref 13.0–17.0)
MCH: 27 pg (ref 26.0–34.0)
MCHC: 32.1 g/dL (ref 30.0–36.0)
MCV: 83.9 fL (ref 78.0–100.0)
RDW: 14.3 % (ref 11.5–15.5)

## 2011-06-10 LAB — GLUCOSE, CAPILLARY: Glucose-Capillary: 152 mg/dL — ABNORMAL HIGH (ref 70–99)

## 2011-06-10 LAB — BASIC METABOLIC PANEL
BUN: 16 mg/dL (ref 6–23)
Calcium: 8.3 mg/dL — ABNORMAL LOW (ref 8.4–10.5)
Creatinine, Ser: 0.76 mg/dL (ref 0.50–1.35)
GFR calc Af Amer: >60 mL/min (ref >60)
GFR calc non Af Amer: >60 mL/min (ref >60)
Glucose, Bld: 148 mg/dL — ABNORMAL HIGH (ref 70–99)
Potassium: 3.6 mEq/L (ref 3.5–5.1)

## 2011-06-11 ENCOUNTER — Inpatient Hospital Stay (HOSPITAL_COMMUNITY): Payer: Medicaid Other

## 2011-06-11 DIAGNOSIS — F4310 Post-traumatic stress disorder, unspecified: Secondary | ICD-10-CM

## 2011-06-11 LAB — CBC
HCT: 39.9 % (ref 39.0–52.0)
RBC: 4.79 MIL/uL (ref 4.22–5.81)
RDW: 13.9 % (ref 11.5–15.5)
WBC: 14 10*3/uL — ABNORMAL HIGH (ref 4.0–10.5)

## 2011-06-11 LAB — MAGNESIUM: Magnesium: 2.1 mg/dL (ref 1.5–2.5)

## 2011-06-11 LAB — BASIC METABOLIC PANEL
BUN: 19 mg/dL (ref 6–23)
CO2: 36 mEq/L — ABNORMAL HIGH (ref 19–32)
Chloride: 101 mEq/L (ref 96–112)
GFR calc Af Amer: >60 mL/min (ref >60)
Potassium: 3.1 mEq/L — ABNORMAL LOW (ref 3.5–5.1)

## 2011-06-11 LAB — GLUCOSE, CAPILLARY: Glucose-Capillary: 108 mg/dL — ABNORMAL HIGH (ref 70–99)

## 2011-06-12 DIAGNOSIS — J690 Pneumonitis due to inhalation of food and vomit: Secondary | ICD-10-CM

## 2011-06-12 DIAGNOSIS — R6521 Severe sepsis with septic shock: Secondary | ICD-10-CM

## 2011-06-12 DIAGNOSIS — T401X4A Poisoning by heroin, undetermined, initial encounter: Secondary | ICD-10-CM

## 2011-06-12 DIAGNOSIS — J9600 Acute respiratory failure, unspecified whether with hypoxia or hypercapnia: Secondary | ICD-10-CM

## 2011-06-12 DIAGNOSIS — A419 Sepsis, unspecified organism: Secondary | ICD-10-CM

## 2011-06-12 LAB — BASIC METABOLIC PANEL
Chloride: 103 mEq/L (ref 96–112)
GFR calc Af Amer: >60 mL/min (ref >60)
GFR calc Af Amer: >60 mL/min (ref >60)
GFR calc non Af Amer: >60 mL/min (ref >60)
Potassium: 2.9 mEq/L — ABNORMAL LOW (ref 3.5–5.1)
Potassium: 3.2 mEq/L — ABNORMAL LOW (ref 3.5–5.1)
Sodium: 139 mEq/L (ref 135–145)

## 2011-06-12 LAB — CBC
HCT: 40 % (ref 39.0–52.0)
Platelets: 276 10*3/uL (ref 150–400)
RBC: 4.91 MIL/uL (ref 4.22–5.81)
RDW: 13.9 % (ref 11.5–15.5)
WBC: 16 10*3/uL — ABNORMAL HIGH (ref 4.0–10.5)

## 2011-06-12 LAB — MAGNESIUM: Magnesium: 2.2 mg/dL (ref 1.5–2.5)

## 2011-06-13 DIAGNOSIS — F1920 Other psychoactive substance dependence, uncomplicated: Secondary | ICD-10-CM

## 2011-06-13 LAB — CBC
Hemoglobin: 14.1 g/dL (ref 13.0–17.0)
MCHC: 33.6 g/dL (ref 30.0–36.0)
RBC: 5.16 MIL/uL (ref 4.22–5.81)
WBC: 13.6 10*3/uL — ABNORMAL HIGH (ref 4.0–10.5)

## 2011-06-13 LAB — PHOSPHORUS: Phosphorus: 3.2 mg/dL (ref 2.3–4.6)

## 2011-06-13 LAB — BASIC METABOLIC PANEL
CO2: 29 mEq/L (ref 19–32)
Chloride: 102 mEq/L (ref 96–112)
GFR calc non Af Amer: >60 mL/min (ref >60)
Glucose, Bld: 94 mg/dL (ref 70–99)
Potassium: 3.6 mEq/L (ref 3.5–5.1)
Sodium: 137 mEq/L (ref 135–145)

## 2011-06-14 LAB — BASIC METABOLIC PANEL
BUN: 13 mg/dL (ref 6–23)
Chloride: 104 mEq/L (ref 96–112)
GFR calc Af Amer: >60 mL/min (ref >60)
Potassium: 3.7 mEq/L (ref 3.5–5.1)

## 2011-06-14 LAB — CBC
HCT: 43.8 % (ref 39.0–52.0)
RDW: 14.4 % (ref 11.5–15.5)
WBC: 14.7 10*3/uL — ABNORMAL HIGH (ref 4.0–10.5)

## 2011-06-14 LAB — MAGNESIUM: Magnesium: 2.1 mg/dL (ref 1.5–2.5)

## 2011-06-15 LAB — CBC
MCH: 27.9 pg (ref 26.0–34.0)
MCHC: 34.4 g/dL (ref 30.0–36.0)
MCV: 80.9 fL (ref 78.0–100.0)
Platelets: 327 10*3/uL (ref 150–400)

## 2011-06-16 NOTE — Consult Note (Signed)
  NAMEANH, MANGANO                  ACCOUNT NO.:  [id number]  MEDICAL RECORD NO.:  [id number]  LOCATION:                                 FACILITY:  PHYSICIAN:  Deshon Koslowski C. Lowell Guitar, M.D.  DATE OF BIRTH:  11/14/1990  DATE OF CONSULTATION: DATE OF DISCHARGE:                                CONSULTATION   HISTORY OF PRESENT ILLNESS:  This patient is a 20 year old male with a history of polysubstance abuse who was found down at home.  Upon presentation, his potassium was found to be greater than 7.5 mEq/L. Repeat potassium was possibly 6.2 mEq/ L,  pH 7.18, pCO2 58, and Nephrology consultation was asked to assist with possible dialytic intervention.  PAST MEDICAL HISTORY:  Positive for polysubstance abuse with several hospitalizations related to this.  Social history, family history, and review of systems are not obtainable at the current time.  MEDICATIONS:  Current medications are listed in the medical record that include sodium bicarbonate infusion, Zosyn, Diprivan, Levophed, Versed, hydrocortisone, and fentanyl.  In the emergency room, the patient apparently received calcium carbonate and bicarbonate.  PHYSICAL EXAMINATION:  VITAL SIGNS:  Blood pressure 127/47 (on pressors), heart rate 105, FIO2 100% saturation, oxygen saturation greater than 90%. GENERAL:  The patient is a Caucasian male who is agitated and intubated. HEENT:  Atraumatic, normocephalic.  Extraocular movements are intact. LUNGS:  Clear. HEART:  Regular rhythm and rate. ABDOMEN:  Soft. EXTREMITIES:  Without significant edema.  The patient moves all his extremities to stimulation.  At 1900, his arterial blood gas revealed pO2 of 128, pCO2 of 41, pH 7.232, sodium 137, potassium 6.2, chloride 104, glucose 108, BUN 21, creatinine 1.70.  Salicylate level was negative.  Cocaine, opiates, and benzodiazepine screens were positive.  ASSESSMENT: 1. Acute renal failure. 2. Metabolic acidosis. 3. Shock.  PLAN: 1.  Supportive therapy. 2. Follow up potassium acid-base status. 3. Dialysis as needed pending further laboratory studies.  At the     current time, I do not feel any dialytic intervention would be     necessary.          ______________________________ Mindi Slicker Lowell Guitar, M.D.     ACP/MEDQ  D:  06/05/2011  T:  06/06/2011  Job:  [id number]  Electronically Signed by Casimiro Needle M.D. on 06/16/2011 04:43:49 PM

## 2011-06-25 ENCOUNTER — Emergency Department (HOSPITAL_COMMUNITY)
Admission: EM | Admit: 2011-06-25 | Discharge: 2011-06-26 | Disposition: A | Discharge status: Home / Self Care | Payer: Medicaid Other | Attending: Emergency Medicine | Admitting: Emergency Medicine

## 2011-06-25 DIAGNOSIS — F3289 Other specified depressive episodes: Secondary | ICD-10-CM | POA: Insufficient documentation

## 2011-06-25 DIAGNOSIS — Z79899 Other long term (current) drug therapy: Secondary | ICD-10-CM | POA: Insufficient documentation

## 2011-06-25 DIAGNOSIS — F329 Major depressive disorder, single episode, unspecified: Secondary | ICD-10-CM | POA: Insufficient documentation

## 2011-06-25 LAB — CBC
MCH: 28.2 pg (ref 26.0–34.0)
MCV: 82 fL (ref 78.0–100.0)
Platelets: 315 10*3/uL (ref 150–400)
RBC: 5.49 MIL/uL (ref 4.22–5.81)
RDW: 14.2 % (ref 11.5–15.5)
WBC: 11.1 10*3/uL — ABNORMAL HIGH (ref 4.0–10.5)

## 2011-06-25 LAB — RAPID URINE DRUG SCREEN, HOSP PERFORMED
Amphetamines: NOT DETECTED
Barbiturates: NOT DETECTED
Benzodiazepines: POSITIVE — AB
Cocaine: NOT DETECTED
Opiates: NOT DETECTED
Tetrahydrocannabinol: NOT DETECTED

## 2011-06-25 LAB — DIFFERENTIAL
Basophils Relative: 0 % (ref 0–1)
Eosinophils Absolute: 0.2 10*3/uL (ref 0.0–0.7)
Eosinophils Relative: 2 % (ref 0–5)
Lymphs Abs: 3.3 10*3/uL (ref 0.7–4.0)
Monocytes Relative: 9 % (ref 3–12)
Neutrophils Relative %: 60 % (ref 43–77)

## 2011-06-25 LAB — COMPREHENSIVE METABOLIC PANEL
ALT: 19 U/L (ref 0–53)
AST: 13 U/L (ref 0–37)
Albumin: 4.4 g/dL (ref 3.5–5.2)
CO2: 23 mEq/L (ref 19–32)
Calcium: 10.1 mg/dL (ref 8.4–10.5)
Creatinine, Ser: 0.78 mg/dL (ref 0.50–1.35)
Sodium: 135 mEq/L (ref 135–145)

## 2011-07-07 NOTE — Discharge Summary (Signed)
[name], [name]                  ACCOUNT NO.:  [id number]  MEDICAL RECORD NO.:  [id number]  LOCATION:                                 FACILITY:  PHYSICIAN:  Richarda Overlie, MD       DATE OF BIRTH:  01-Sep-1991  DATE OF ADMISSION:  06/05/2011 DATE OF DISCHARGE:  06/15/2011                              DISCHARGE SUMMARY   PRIMARY CARE PHYSICIAN:  Unassigned.  DISCHARGE DIAGNOSES: 1. Unintentional drug overdose. 2. Narcotic dependence. 3. History of polysubstance abuse. 4. Non-ST-elevation myocardial infarction. 5. Overdose with heroin, cocaine, and benzodiazepines. 6. Hyperkalemia on admission. 7. Acute renal failure. 8. Ventilator-dependent respiratory failure. 9. Aspiration pneumonia. 10.Mixed respiratory and metabolic acidosis. 11.Hypertension. 12.Shock requiring vasopressor therapy. 13.Coma for an unknown period of time. 14.Rhabdomyolysis. 15.Acute renal failure, now resolved.  PROCEDURES:  Portable chest x-ray is grossly unchanged small bilateral pleural effusions.  Hyperkalemia, resolved.  CONSULTATIONS: 1. Rollene Rotunda, MD, [facility], for non-ST-elevation MI. 2. Britt Bottom C. Lowell Guitar, MD, for acute renal failure and metabolic     acidosis. 3. Psychiatry for unintentional drug overdose.  SUBJECTIVE:  This is a 20 year old male without any prior cardiac history who presents with unintentional drug overdose, ventilator- dependent respiratory failure, hyperkalemia, and acute renal failure. The patient was admitted to the ICU for further evaluation.  HOSPITAL COURSE: 1. The patient had a longstanding history of polysubstance abuse and     had an elective admission at [facility] for detox in July 2012.  Following     detox, the patient was living in a sober house and apparently doing     well following up with his sponsor and also working regularly.  The     patient was in [place] partying with some friends on     June 04, 2011, and apparently became quite high.   Friends     dropped him up at the home and he was found to be unresponsive by     his girlfriend.  The patient was brought in by EMS to the [facility] ER     and was found to be hyperkalemic with a potassium of 7.5.  He was     also found to be in acute renal failure with a creatinine of 1.7     and an elevated troponin of 0.73.  The patient was intubated and     admitted and managed by Critical Care Service requiring sedation as     well as vasopressor therapy for ongoing hypertension.  He was     diagnosed with aspiration pneumonia and was started on broad-     spectrum antibiotics namely vancomycin and Zosyn.  The patient's     hyperkalemia resolved quickly.  His cardiac enzymes were sent off     and were found to be elevated with a troponin I of 16.35.     Cardiology was asked to evaluate the patient.  A 2-D echo was     obtained which was found to be within normal limits.  Supportive     care with aspirin and heparin was recommended.  No beta-blocker was     given because of concerns about cocaine  abuse.  The patient was     ruled out for myocarditis as well as pericarditis.  The patient had multiple procedures done including  removal of endotracheal tube placement from June 05, 2011, to June 10, 2011.  Right radial A-line placed from June 05, 2011, to June 09, 2011.  Foley was placed on June 05, 2011.  Orogastric tube placed from June 11, 2011, to June 10, 2011.  Right IJ catheter placed from June 06, 2011, to June 10, 2011.  PERTINENT DATA:  The patient's blood cultures were positive for staph coag negative and sputum was positive for group A strep.  SUBSEQUENT HOSPITAL COURSE: 1. Drug overdose.  The patient was initiated on Klonopin and     methadone.  The patient's methadone is being discontinued prior to     discharge and he is being provided with a clonidine taper.     Prescription for clonidine has been provided.  The patient was      referred to Social Work as well as Psychiatry again.  Per     psychiatry evaluation which was done on June 14, 2011, the     patient is motivated for rehab.  It was clearly stated that the     patient's drug overdose was unintentional.  The patient was not     agitated and was found to be calm.  He is ready to go to Rehab for     polysubstance abuse and he does not need inpatient treatment at     this time.  He will continue with Klonopin and clonidine taper. 2. Acute respiratory failure has now resolved.  The patient is     currently ambulating in the hallways and 100% on room air.  DISPOSITION:  The patient will be discharged to home with his mother and voluntarily will go to Inpatient Rehab for continued treatment of his substance abuse.  Psychiatry saw him prior to discharge and he was cleared for discharge from their standpoint.  The plan of care was discussed with the patient's mother and she is in agreement with this.     Richarda Overlie, MD     NA/MEDQ  D:  06/15/2011  T:  06/15/2011  Job:  [id number]  Electronically Signed by Richarda Overlie MD on 07/07/2011 02:02:19 PM

## 2011-07-10 NOTE — Consult Note (Signed)
NAMEGOLDIE, [name]                  ACCOUNT NO.:  [id number]  MEDICAL RECORD NO.:  [id number]  LOCATION:  2901                         FACILITY:  [facility]  PHYSICIAN:  Rollene Rotunda, MD, FACCDATE OF BIRTH:  23-Apr-1991  DATE OF CONSULTATION:  06/06/2011 DATE OF DISCHARGE:                                CONSULTATION   PRIMARY CARDIOLOGIST:  New to [facility] being seen by Dr. Antoine Poche.  PATIENT PROFILE:  A 20 year old male without prior cardiac history who presented following drug overdose and subsequent ventilator-dependent respiratory failure with hyperkalemia, renal failure and hypotension requiring vasopressor therapy.  We have been asked to evaluate secondary to elevated cardiac markers.  PROBLEMS: 1. Non ST-segment elevation myocardial infarction.     a.     Two-D echocardiogram June 06, 2011, ejection fraction 50-      55%. 2. Polysubstance abuse and overdose reportedly involving heroin,     cocaine and benzos. 3. Hyperkalemia on admission with potassium 7.5, now corrected. 4. Acute renal failure now improving. 5. Ventilator-dependent respiratory failure. 6. Aspiration pneumonia. 7. Mixed respiratory and metabolic acidosis. 8. Hypotension/shock requiring vasopressor therapy. 9. Coma with unknown down time.  ALLERGIES:  No known drug allergies.  HISTORY OF PRESENT ILLNESS:  A 20 year old male without prior cardiac history.  He has a long history of polysubstance abuse and is status post elective admission at [facility] for heroin detox in July 2012.  Following detox, the patient had been living in a sober house and apparently doing well, following up with his sponsor and also working regularly.  By report from family and girlfriend, the patient was in [place] and partying with some friends on June 04, 2011, and apparently became quite high.  The patient's friends dropped him off at home.  The patient was found to be unresponsive by his  girlfriend but was discovered by another friend that the patient was reportedly pulseless and not breathing and CPR was initiated.  EMS was then called and the patient was taken to the [facility] ED where he was found to be hyperkalemic with potassium of greater than 7.5.  He has also had renal failure with creatinine of 1.7.  His initial troponin was 0.73 with a CK of 5416 and an MB of 25.2, there were normal index.  He was intubated and admitted and managed by the Critical Care Service requiring sedation as well as vasopressor therapy for ongoing hypotension.  The patient is also diagnosed with aspiration pneumonia and is currently being treated with vancomycin and Zosyn.  Whereas his renal function is improving and his hyperkalemia has resolved, he remains hypertensive and additional cardiac enzymes were sent off and the patient was found to have elevation of his CK to 7782 with an MB 51.5, index of 0.7, troponin-I of 16.35.  We have been asked to evaluate.  Currently, the patient is intubated and sedated and unable to answer questions.  There is no prior history of chest pain.  He did have an echocardiogram today which showed normal LV function without wall motion abnormalities or effusions.  ALLERGIES:  No known drug allergies.  CURRENT MEDICATIONS: 1. Fentanyl infusion. 2. Heparin infusion. 3. Versed infusion.  4. Norepinephrine 10 mcg per minute. 5. Aspirin 325 mg via tube daily. 6. Solu-Cortef 100 mg IV q. 8 hours. 7. Combivent 6 puffs inhaled q. 6 hours. 8. Protonix 40 mg IV daily. 9. Zosyn 3.375 g IV q. 8 hours. 10.Vancomycin 750 mg IV q. 8 hours.  FAMILY HISTORY:  Apparently notable for substance use in his father.  SOCIAL HISTORY:  The patient lives in [place] with his mother.  As noted in the HPI, he has a history of heroin addiction and detox at [facility].  Per notes in July, the patient was using 9 dollars a day worth of heroin.  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  REVIEW OF SYSTEMS:  I am unable to obtain given ventilated status.  PHYSICAL EXAMINATION:  VITAL SIGNS:  Temperature 99.7, heart rate 75, respirations 16, blood pressure 101/52, CVP of 8, pulse ox 100% on 40% O2, 79 kg. GENERAL:  White male in no acute distress, is intubated and sedated. HEENT:  Normal. SKIN:  Warm and dry without lesions or masses. NECK:  Supple without bruits or JVD. LUNGS:  Respirations are regular and unlabored.  Clear to auscultation. CARDIAC:  Regular S1, S2.  No S3, S4 or murmurs. ABDOMEN:  Round, soft, nontender, distended.  Bowel sounds present x4. EXTREMITIES:  Warm, dry, pink.  No clubbing, cyanosis or edema.  Distal pulses are 2+ and equal bilaterally.  LABS AND DIAGNOSTICS:  Chest x-ray showed bilateral central basilar airspace disease.  He had a chest CT yesterday that showed no PE with infiltrates throughout the right lower lobe suggestive of aspiration. EKG shows sinus rhythm rate of 99, normal axis, no acute ST-T changes. Hemoglobin 15.2, hematocrit 44.9, WBC 22.9, platelets 286.  Sodium 140, potassium 4.9, chloride 109, CO2 24, BUN 15, creatinine 1.47, glucose 135.  CK 7439, MB 51.5, troponin-I 16.35, relative index 0.7, calcium 7.7, magnesium 1.4, phosphorus 2.4.  ASSESSMENT AND PLAN:  Non-ST-segment elevation myocardial infarction. The patient has elevated cardiac markers in the setting of drug overdose complicated by rhabdomyolysis, renal failure, aspiration pneumonia and ventilatory-dependent respiratory failure as well as ongoing hypertension.  He had normal LV function by 2-D echocardiogram.  Plan: Supportive care with aspirin and heparin for now.  No beta-blocker concerning hypertension, cocaine abuse.  No obvious signs of acute coronary syndrome or myocarditis/pericarditis.  We will follow up enzymes. Consider repeat echocardiogram in days to come.  No indication for  acute invasive evaluation at this time.     Nicolasa Ducking, ANP   ______________________________ Rollene Rotunda, MD, [facility]    CB/MEDQ  D:  06/06/2011  T:  06/07/2011  Job:  [id number]  Electronically Signed by Nicolasa Ducking ANP on 06/12/2011 03:33:08 PM Electronically Signed by Rollene Rotunda MD [facility] on 07/10/2011 01:36:42 PM

## 2011-08-29 ENCOUNTER — Emergency Department (HOSPITAL_COMMUNITY): Payer: Medicaid Other

## 2011-08-29 ENCOUNTER — Encounter: Payer: Self-pay | Admitting: *Deleted

## 2011-08-29 ENCOUNTER — Inpatient Hospital Stay (HOSPITAL_COMMUNITY)
Admission: EM | Admit: 2011-08-29 | Discharge: 2011-09-10 | DRG: 917 | Disposition: A | Discharge status: Rehabilitation Facility | Payer: Medicaid Other | Attending: Internal Medicine | Admitting: Internal Medicine

## 2011-08-29 DIAGNOSIS — F29 Unspecified psychosis not due to a substance or known physiological condition: Secondary | ICD-10-CM | POA: Diagnosis present

## 2011-08-29 DIAGNOSIS — N179 Acute kidney failure, unspecified: Secondary | ICD-10-CM | POA: Diagnosis present

## 2011-08-29 DIAGNOSIS — M6282 Rhabdomyolysis: Secondary | ICD-10-CM | POA: Diagnosis present

## 2011-08-29 DIAGNOSIS — T401X1A Poisoning by heroin, accidental (unintentional), initial encounter: Secondary | ICD-10-CM

## 2011-08-29 DIAGNOSIS — T401X4A Poisoning by heroin, undetermined, initial encounter: Principal | ICD-10-CM | POA: Diagnosis present

## 2011-08-29 DIAGNOSIS — Z5900 Homelessness unspecified: Secondary | ICD-10-CM

## 2011-08-29 DIAGNOSIS — Z720 Tobacco use: Secondary | ICD-10-CM | POA: Diagnosis present

## 2011-08-29 DIAGNOSIS — F329 Major depressive disorder, single episode, unspecified: Secondary | ICD-10-CM | POA: Diagnosis present

## 2011-08-29 DIAGNOSIS — J690 Pneumonitis due to inhalation of food and vomit: Secondary | ICD-10-CM | POA: Diagnosis present

## 2011-08-29 DIAGNOSIS — F19939 Other psychoactive substance use, unspecified with withdrawal, unspecified: Secondary | ICD-10-CM | POA: Diagnosis not present

## 2011-08-29 DIAGNOSIS — T405X1A Poisoning by cocaine, accidental (unintentional), initial encounter: Secondary | ICD-10-CM

## 2011-08-29 DIAGNOSIS — E875 Hyperkalemia: Secondary | ICD-10-CM | POA: Diagnosis present

## 2011-08-29 DIAGNOSIS — R000 Tachycardia, unspecified: Secondary | ICD-10-CM | POA: Diagnosis not present

## 2011-08-29 DIAGNOSIS — F3289 Other specified depressive episodes: Secondary | ICD-10-CM | POA: Diagnosis present

## 2011-08-29 DIAGNOSIS — J9600 Acute respiratory failure, unspecified whether with hypoxia or hypercapnia: Secondary | ICD-10-CM

## 2011-08-29 DIAGNOSIS — F1120 Opioid dependence, uncomplicated: Secondary | ICD-10-CM | POA: Diagnosis present

## 2011-08-29 DIAGNOSIS — R7989 Other specified abnormal findings of blood chemistry: Secondary | ICD-10-CM | POA: Diagnosis present

## 2011-08-29 DIAGNOSIS — R109 Unspecified abdominal pain: Secondary | ICD-10-CM | POA: Diagnosis not present

## 2011-08-29 DIAGNOSIS — R110 Nausea: Secondary | ICD-10-CM | POA: Diagnosis not present

## 2011-08-29 DIAGNOSIS — R197 Diarrhea, unspecified: Secondary | ICD-10-CM | POA: Diagnosis not present

## 2011-08-29 DIAGNOSIS — F411 Generalized anxiety disorder: Secondary | ICD-10-CM | POA: Diagnosis present

## 2011-08-29 DIAGNOSIS — F17200 Nicotine dependence, unspecified, uncomplicated: Secondary | ICD-10-CM | POA: Diagnosis present

## 2011-08-29 HISTORY — DX: Personal history of other endocrine, nutritional and metabolic disease: Z86.39

## 2011-08-29 HISTORY — DX: Respiratory failure, unspecified, unspecified whether with hypoxia or hypercapnia: J96.90

## 2011-08-29 HISTORY — DX: Poisoning by unspecified drugs, medicaments and biological substances, accidental (unintentional), initial encounter: T50.901A

## 2011-08-29 HISTORY — DX: Major depressive disorder, single episode, unspecified: F32.9

## 2011-08-29 HISTORY — DX: Depression, unspecified: F32.A

## 2011-08-29 HISTORY — DX: Essential (primary) hypertension: I10

## 2011-08-29 HISTORY — DX: Pneumonia, unspecified organism: J18.9

## 2011-08-29 HISTORY — DX: Non-ST elevation (NSTEMI) myocardial infarction: I21.4

## 2011-08-29 HISTORY — DX: Anxiety disorder, unspecified: F41.9

## 2011-08-29 HISTORY — DX: Unspecified convulsions: R56.9

## 2011-08-29 HISTORY — DX: Rhabdomyolysis: M62.82

## 2011-08-29 HISTORY — DX: Shock, unspecified: R57.9

## 2011-08-29 LAB — BLOOD GAS, ARTERIAL
Acid-base deficit: 1.9 mmol/L (ref 0.0–2.0)
Bicarbonate: 23.5 mEq/L (ref 20.0–24.0)
Drawn by: 30996
Drawn by: 336861
Expiratory PAP: 5
FIO2: 1 %
Inspiratory PAP: 12
O2 Saturation: 89.8 %
O2 Saturation: 99.4 %
TCO2: 20.6 mmol/L (ref 0–100)
pCO2 arterial: 44.1 mmHg (ref 35.0–45.0)
pCO2 arterial: 36.1 mmHg (ref 35.0–45.0)
pH, Arterial: 7.433 (ref 7.350–7.450)
pO2, Arterial: 57 mmHg — ABNORMAL LOW (ref 80.0–100.0)
pO2, Arterial: 179 mmHg — ABNORMAL HIGH (ref 80.0–100.0)

## 2011-08-29 LAB — RAPID URINE DRUG SCREEN, HOSP PERFORMED
Cocaine: POSITIVE — AB
Opiates: POSITIVE — AB

## 2011-08-29 LAB — COMPREHENSIVE METABOLIC PANEL
AST: 40 U/L — ABNORMAL HIGH (ref 0–37)
Albumin: 4.2 g/dL (ref 3.5–5.2)
Alkaline Phosphatase: 53 U/L (ref 39–117)
BUN: 17 mg/dL (ref 6–23)
Potassium: 6.3 mEq/L (ref 3.5–5.1)
Total Protein: 8.2 g/dL (ref 6.0–8.3)

## 2011-08-29 LAB — DIFFERENTIAL
Basophils Absolute: 0 10*3/uL (ref 0.0–0.1)
Basophils Relative: 0 % (ref 0–1)
Eosinophils Absolute: 0 10*3/uL (ref 0.0–0.7)
Monocytes Relative: 11 % (ref 3–12)
Neutro Abs: 14.2 10*3/uL — ABNORMAL HIGH (ref 1.7–7.7)
Neutrophils Relative %: 79 % — ABNORMAL HIGH (ref 43–77)

## 2011-08-29 LAB — CBC
MCH: 28.6 pg (ref 26.0–34.0)
MCHC: 33.3 g/dL (ref 30.0–36.0)
Platelets: 375 10*3/uL (ref 150–400)
RDW: 13 % (ref 11.5–15.5)

## 2011-08-29 LAB — URINE MICROSCOPIC-ADD ON

## 2011-08-29 LAB — GLUCOSE, CAPILLARY
Glucose-Capillary: 85 mg/dL (ref 70–99)
Glucose-Capillary: 95 mg/dL (ref 70–99)

## 2011-08-29 LAB — BASIC METABOLIC PANEL
BUN: 16 mg/dL (ref 6–23)
CO2: 28 mEq/L (ref 19–32)
Chloride: 101 mEq/L (ref 96–112)
GFR calc Af Amer: >90 mL/min (ref >90)
Potassium: 4.7 mEq/L (ref 3.5–5.1)

## 2011-08-29 LAB — URINALYSIS, ROUTINE W REFLEX MICROSCOPIC
Glucose, UA: NEGATIVE mg/dL
Leukocytes, UA: NEGATIVE
pH: 5 (ref 5.0–8.0)

## 2011-08-29 LAB — CK: Total CK: 2221 U/L — ABNORMAL HIGH (ref 7–232)

## 2011-08-29 LAB — TRICYCLICS SCREEN, URINE: TCA Scrn: NOT DETECTED

## 2011-08-29 MED ORDER — NALOXONE HCL 1 MG/ML IJ SOLN
INTRAMUSCULAR | Status: AC
  Administered 2011-08-29: 13:00:00 via NASAL
  Filled 2011-08-29: qty 4

## 2011-08-29 MED ORDER — SODIUM BICARBONATE 8.4 % IV SOLN
50.0000 meq | Freq: Once | INTRAVENOUS | Status: AC
  Administered 2011-08-29: 50 meq via INTRAVENOUS
  Filled 2011-08-29: qty 50

## 2011-08-29 MED ORDER — SODIUM CHLORIDE 0.9 % IV SOLN
Freq: Once | INTRAVENOUS | Status: AC
  Administered 2011-08-29: 15:00:00 via INTRAVENOUS

## 2011-08-29 MED ORDER — PANTOPRAZOLE SODIUM 40 MG IV SOLR
40.0000 mg | INTRAVENOUS | Status: DC
  Administered 2011-08-29 – 2011-08-30 (×2): 40 mg via INTRAVENOUS
  Filled 2011-08-29 (×3): qty 40

## 2011-08-29 MED ORDER — SODIUM CHLORIDE 0.9 % IV SOLN
1.5000 g | Freq: Four times a day (QID) | INTRAVENOUS | Status: DC
  Administered 2011-08-29 – 2011-09-01 (×13): 1.5 g via INTRAVENOUS
  Filled 2011-08-29 (×18): qty 1.5

## 2011-08-29 MED ORDER — DEXTROSE 5 % IV SOLN
Freq: Once | INTRAVENOUS | Status: AC
  Administered 2011-08-29: 21:00:00 via INTRAVENOUS

## 2011-08-29 MED ORDER — HEPARIN SODIUM (PORCINE) 5000 UNIT/ML IJ SOLN
5000.0000 [IU] | Freq: Three times a day (TID) | INTRAMUSCULAR | Status: DC
  Administered 2011-08-29 – 2011-08-30 (×4): 5000 [IU] via SUBCUTANEOUS
  Filled 2011-08-29 (×20): qty 1

## 2011-08-29 MED ORDER — SODIUM CHLORIDE 0.9 % IV SOLN
Freq: Once | INTRAVENOUS | Status: AC
Start: 2011-08-29 — End: 2011-08-29
  Administered 2011-08-29: 14:00:00 via INTRAVENOUS

## 2011-08-29 MED ORDER — SODIUM CHLORIDE 0.9 % IV SOLN
INTRAVENOUS | Status: DC
  Administered 2011-08-30 – 2011-09-01 (×6): via INTRAVENOUS

## 2011-08-29 NOTE — ED Notes (Signed)
Pt blood sugar 62. Dr byrum (ICU dr) was paged about blood sugar level. Alerted of all the days blood sugar levels and their decrease. Dr put in verbal order of D5W NS 150 ml/hr.

## 2011-08-29 NOTE — ED Notes (Addendum)
Pt gave RN his "adopted mother" Misty Stanley phone number [phone] and stated she was allowed to know everything about his care and hospitalization. Misty Stanley reported that she probably could not come up and visit tonight, but that she would try to visit tomorrow. She was given RN phone number and main [facility] number to be able to call and get an update about pt. Pt then gave RN his biological mothers name and number Rosey Bath [phone].   Pt mother was called. She stated that if asked as next of kin she wanted all resuscitation measures used, full code. She stated that "clearly he is a person that if he walks out of the hospital will die from a heroine overdose". Reports he has been hospitalized multiple times, she has had him involuntarily committed and in rehab and that now she can do nothing but sick back and "watch him die" because she does not know anything else to do.

## 2011-08-29 NOTE — ED Notes (Signed)
Per EMS:  Pt was found by hotel staff unresponsive, was responsive to sternal rub.  EMS found white powdery substance and multiple medications.  EMS gave 2mg  narcan nasally in route.  Respirations were agonal upon EMS' arrival, respirations 4/min.

## 2011-08-29 NOTE — Progress Notes (Signed)
ANTIBIOTIC CONSULT NOTE - INITIAL  Pharmacy Consult for Unasyn Indication: LLL aspiration pneumonia  Allergies  Allergen Reactions  . Seroquel (Quetiapine Fumerate)     "DOESN'T MAKE HIM FEEL GOOD"    Patient Measurements:    Vital Signs: Temp: 100.6 F (38.1 C) (11/30 1730) Temp src: Core (Comment) (11/30 1616) BP: 94/62 mmHg (11/30 1730) Pulse Rate: 121  (11/30 1730) Intake/Output from previous day:   Intake/Output from this shift:    Labs:  Basename 08/29/11 1320  WBC 18.0*  HGB 16.9  PLT 375  LABCREA --  CREATININE 1.39*   CrCl is unknown because there is no height on file for the current visit. No results found for this basename: VANCOTROUGH:2,VANCOPEAK:2,VANCORANDOM:2,GENTTROUGH:2,GENTPEAK:2,GENTRANDOM:2,TOBRATROUGH:2,TOBRAPEAK:2,TOBRARND:2,AMIKACINPEAK:2,AMIKACINTROU:2,AMIKACIN:2, in the last 72 hours   Microbiology: No results found for this or any previous visit (from the past 720 hour(s)).  Medical History: Past Medical History  Diagnosis Date  . Anxiety   . Depression   . Hypertension   . Pneumonia     aspiration  . Non-ST elevation MI (NSTEMI) 05/2011  . Seizures 2010  . Shock     History of  . Respiratory failure     History of  . Rhabdomyolysis     History of  . Overdose     unintentional  . History of hyperkalemia     Medications:  Adderall, clonazepam, Prozac, Ambien  Assessment: Empiric Unasyn for aspiration pneumonia. No height or weight recorded yet - RN reports <150lbs. SCr elevated, likely pre-renal, CrCl ~86 (normalized)  Goal of Therapy:  eradication of infection  Plan:  Unasyn 1.5mg  IV q6h Follow up culture results  Reece Packer 08/29/2011,5:57 PM

## 2011-08-29 NOTE — ED Notes (Addendum)
Error in documentation. RN did not mean to click off ABG due at 2000. So Rn modified order to be entered again.

## 2011-08-29 NOTE — ED Notes (Signed)
ICU dr at bedside. RN discussed with dr and pt care of plan for patient. After dr left critical potassium level resulted. ICU dr paged. Another ICU dr called back, RN reported critical value and ICU dr said he would pass this on to pt ICU DR.

## 2011-08-29 NOTE — ED Provider Notes (Cosign Needed)
History     CSN: [id number] Arrival date & time: 08/29/2011  1:20 PM   First MD Initiated Contact with Patient 08/29/11 1323      Chief Complaint  Patient presents with  . Drug Overdose    (Consider location/radiation/quality/duration/timing/severity/associated sxs/prior treatment) HPI Comments: EMS was called to patient until when he didn't check out. They found him unresponsive, with multiple baggies nearby as well as prescription bottles. They gave him 2 mg of Narcan intranasally, with some arousal. He was therefore transferred reported to [facility] long ED for evaluation.  Patient is a 20 y.o. male presenting with Overdose. The history is provided by the EMS personnel and medical records. No language interpreter was used.  Drug Overdose This is a new (It is unknown when the patient took his intravenous narcotic.) problem. The problem has been gradually improving. The symptoms are aggravated by nothing. The symptoms are relieved by medications. Treatments tried: Patient was given intranasal naloxone by EMS. The treatment provided mild relief.    Past Medical History  Diagnosis Date  . Anxiety   . Depression     History reviewed. No pertinent past surgical history.  No family history on file.  History  Substance Use Topics  . Smoking status: Not on file  . Smokeless tobacco: Not on file  . Alcohol Use:       Review of Systems  Unable to perform ROS: Mental status change    Allergies  Review of patient's allergies indicates not on file.  Home Medications   Current Outpatient Rx  Name Route Sig Dispense Refill  . AMPHETAMINE-DEXTROAMPHET ER 20 MG PO CP24 Oral Take 20 mg by mouth every morning.      Marland Kitchen CLONAZEPAM 0.5 MG PO TABS Oral Take 2 mg by mouth 2 (two) times daily as needed.     Marland Kitchen FLUOXETINE HCL 10 MG PO CAPS Oral Take 10 mg by mouth daily.      Marland Kitchen ZOLPIDEM TARTRATE 10 MG PO TABS Oral Take 10 mg by mouth at bedtime as needed.        BP 100/69  Pulse 110   Temp(Src) 97.8 F (36.6 C) (Core (Comment))  Resp 34  SpO2 83%  Physical Exam  Constitutional:       Patient is a slender young man, who is awake and looks around, and is able answer simple questions. His pupils are about 3 mm and react to light.  His skin feels cold to the touch.  HENT:  Head: Normocephalic and atraumatic.  Right Ear: External ear normal.  Left Ear: External ear normal.  Mouth/Throat: Oropharynx is clear and moist.  Eyes: Conjunctivae and EOM are normal. Pupils are equal, round, and reactive to light.  Neck: Normal range of motion. Neck supple.  Cardiovascular: Normal rate, regular rhythm and normal heart sounds.   Pulmonary/Chest: Effort normal and breath sounds normal.  Abdominal: Soft. Bowel sounds are normal.  Musculoskeletal: Normal range of motion.  Neurological:       Patient is drowsy, but awake, looking around. There is no sensory or motor deficit.  Skin:       He has multiple recent and old needle tracks on his arms and legs.  Psychiatric:       Unable to assess.    ED Course  CRITICAL CARE Performed by: Osvaldo Human Authorized by: Osvaldo Human Total critical care time: 60 minutes Critical care was necessary to treat or prevent imminent or life-threatening deterioration of the following conditions:  toxidrome. Critical care was time spent personally by me on the following activities: discussions with consultants, evaluation of patient's response to treatment, examination of patient, obtaining history from patient or surrogate, ordering and performing treatments and interventions, ordering and review of laboratory studies, ordering and review of radiographic studies, re-evaluation of patient's condition and review of old charts.   (including critical care time)  Labs Reviewed  CBC - Abnormal; Notable for the following:    WBC 18.0 (*)    RBC 5.90 (*)    All other components within normal limits  DIFFERENTIAL - Abnormal; Notable for the  following:    Neutrophils Relative 79 (*)    Neutro Abs 14.2 (*)    Lymphocytes Relative 10 (*)    Monocytes Absolute 2.0 (*)    All other components within normal limits  COMPREHENSIVE METABOLIC PANEL - Abnormal; Notable for the following:    Potassium 6.3 (*)    Chloride 94 (*)    Glucose, Bld 117 (*)    Creatinine, Ser 1.39 (*)    AST 40 (*)    GFR calc non Af Amer 71 (*)    GFR calc Af Amer 83 (*)    All other components within normal limits  URINALYSIS, ROUTINE W REFLEX MICROSCOPIC - Abnormal; Notable for the following:    APPearance CLOUDY (*)    Hgb urine dipstick SMALL (*)    Ketones, ur TRACE (*)    All other components within normal limits  CK - Abnormal; Notable for the following:    Total CK 2221 (*)    All other components within normal limits  URINE RAPID DRUG SCREEN (HOSP PERFORMED) - Abnormal; Notable for the following:    Opiates POSITIVE (*)    Cocaine POSITIVE (*)    Benzodiazepines POSITIVE (*)    Amphetamines POSITIVE (*)    All other components within normal limits  SALICYLATE LEVEL - Abnormal; Notable for the following:    Salicylate Lvl <2.0 (*)    All other components within normal limits  URINE MICROSCOPIC-ADD ON - Abnormal; Notable for the following:    Squamous Epithelial / LPF FEW (*)    Bacteria, UA FEW (*)    All other components within normal limits  ETHANOL  ACETAMINOPHEN LEVEL  GLUCOSE, CAPILLARY  GLUCOSE, CAPILLARY  GLUCOSE, CAPILLARY  TRICYCLICS SCREEN, URINE  POTASSIUM   3:48 PM Patient was seen and had physical examination. Old charts were reviewed. Laboratory tests were ordered. The Bair hugger was ordered for his hypothermia.   Date: 08/29/2011  Rate: 107  Rhythm: sinus tachycardia  QRS Axis: normal  Intervals: QT prolonged  ST/T Wave abnormalities: normal  Conduction Disutrbances:none  Narrative Interpretation: Abnormal EKG.  Old EKG Reviewed: none available--Had sinus brady cardia on 06/10/2011.  3:48 PM patient's temp  is coming up. He is much more alert. BP still low running 80-90 systolic. I ordered an additional bolus of normal saline. Review of her lab tests shows an elevated potassium, which may be from hemolysis. The serum potassium was repeated. CK total was 2221, reflecting rhabdomyolysis. CBC showed a white count of 18,000 with 79% neutrophils, reflective of inflammatory changes urinalysis was negative urine drug screen was positive for amphetamines, benzodiazepines, opiates, and cocaine. Critical care was consulted.  3:48 PM Case discussed with Dr. Delton Coombes.  PCCM will admit pt.   4:16 PM PO2 57 on non-rebreather.  Add BiPAP.  1. Heroin overdose            Carleene Cooper  III, MD 08/29/11 1548  Carleene Cooper III, MD 08/29/11 1558  Carleene Cooper III, MD 08/29/11 [phone]

## 2011-08-29 NOTE — ED Notes (Signed)
AVW:UJWJ<XB> Expected date:08/29/11<BR> Expected time: 1:05 PM<BR> Means of arrival:Ambulance<BR> Comments:<BR> M10 - 20yoM Found in hotel agonal respirations.  Responded to narcan

## 2011-08-29 NOTE — ED Notes (Signed)
Portable xray at bedside.

## 2011-08-29 NOTE — H&P (Addendum)
Name: [name] MRN: [id number] DOB: 1991/09/26    LOS: 0  PCCM ADMISSION NOTE  History of Present Illness: 20 y.o with psychoses & polysusbtance abuse, found down in hotel room, hypothermic, hypotensive on arrival. Admitted 11/30 for acute resp failure requiring BiPAP, Lt aspiration pna. Admits to 4 mg klonopin, smoked cocaine on 11/29 & used heroin on 11/30. Labs s/o mild rhabdo. Recent adm to behaviour health 9/12   Lines / Drains: none  Cultures: bld 11/30 >>  Antibiotics: unasyn 11/30 >>  Tests / Events: U DS pos cocaine, heroin, amphetamines(adderall), BDZ (klonopin)   History - EMS was called to patient's hotel room until when he didn't check out. They found him unresponsive, with multiple baggies nearby as well as prescription bottles. They gave him 2 mg of Narcan intranasally, with some arousal.  Brought in hypothermic, hypotensive, responded to 2 L bolus fluids, warming, placed on BiPAP, pCXR s/o Lt aspiration pneumonia. Admits to 4 mg klonopin, smoked cocaine on 11/29 & used heroin on 11/30. Mother stated that "clearly he is a person that if he walks out of the hospital will die from a heroine overdose". Reports he has been hospitalized multiple times, she has had him involuntarily committed and in rehab and that now she can do nothing but sick back and "watch him die" because she does not know anything else to do.   Past Medical History  Diagnosis Date  . Anxiety   . Depression   . Hypertension   . Pneumonia     aspiration  . Non-ST elevation MI (NSTEMI) 05/2011  . Seizures 2010  . Shock     History of  . Respiratory failure     History of  . Rhabdomyolysis     History of  . Overdose     unintentional  . History of hyperkalemia    Past Surgical History  Procedure Date  . Mandible fracture surgery     age 19   Prior to Admission medications   Medication Sig Start Date End Date Taking? Authorizing Provider  amphetamine-dextroamphetamine (ADDERALL  XR) 20 MG 24 hr capsule Take 20 mg by mouth 2 (two) times daily.    Yes Historical Provider, MD  clonazePAM (KLONOPIN) 0.5 MG tablet Take 2 mg by mouth 2 (two) times daily as needed.    Yes Historical Provider, MD  clonazePAM (KLONOPIN) 2 MG tablet Take 2 mg by mouth 2 (two) times daily as needed.     Yes Historical Provider, MD  FLUoxetine (PROZAC) 10 MG capsule Take 20 mg by mouth daily.    Yes Historical Provider, MD  zolpidem (AMBIEN) 10 MG tablet Take 10 mg by mouth at bedtime as needed.     Yes Historical Provider, MD  amphetamine-dextroamphetamine (ADDERALL XR) 20 MG 24 hr capsule Take 20 mg by mouth every morning.      Historical Provider, MD  FLUoxetine (PROZAC) 20 MG capsule Take 20 mg by mouth daily. TAKE 3 CAPSULES BY MOUTH EVERY MORNING     Historical Provider, MD   Allergies Allergies  Allergen Reactions  . Seroquel (Quetiapine Fumerate)     "DOESN'T MAKE HIM FEEL GOOD"    Family History History reviewed. No pertinent family history.  Social History  reports that he has been smoking Cigarettes.  He has a 9 pack-year smoking history. He has never used smokeless tobacco. He reports that he drinks about .6 ounces of alcohol per week. He reports that he uses illicit drugs ("Crack" cocaine, Heroin,  and Benzodiazepines).  Review Of Systems  11 points review of systems is negative with an exception of listed in HPI.  Vital Signs: Temp:  [93 F (33.9 C)-100 F (37.8 C)] 100 F (37.8 C) (11/30 1616) Pulse Rate:  [110] 110  (11/30 1328) Resp:  [28-39] 28  (11/30 1616) BP: (93-119)/(57-81) 93/57 mmHg (11/30 1616) SpO2:  [74 %-92 %] 92 % (11/30 1616)    No intake or output data in the 24 hours ending 08/29/11 1710  Physical Examination: General:  Lethargic but arouses easily Neuro:  GCS15 when aroused,  nnon focal   HEENT:  Pupils 4mm RL, no pallor, icterus  Neck:  No jvd, LNs   Cardiovascular:  s1s2 tachy Lungs:  Decreased lt base, no rhonchi Abdomen:  Soft, non  tender Musculoskeletal:  No edema Skin: erythematous popped blisters over back & upper chest  Ventilator settings: biPAP 13/5 100%    Labs and Imaging:  Reviewed.  CBC    Component Value Date/Time   WBC 18.0* 08/29/2011 1320   RBC 5.90* 08/29/2011 1320   HGB 16.9 08/29/2011 1320   HCT 50.8 08/29/2011 1320   PLT 375 08/29/2011 1320   MCV 86.1 08/29/2011 1320   MCH 28.6 08/29/2011 1320   MCHC 33.3 08/29/2011 1320   RDW 13.0 08/29/2011 1320   LYMPHSABS 1.8 08/29/2011 1320   MONOABS 2.0* 08/29/2011 1320   EOSABS 0.0 08/29/2011 1320   BASOSABS 0.0 08/29/2011 1320    BMET    Component Value Date/Time   NA 135 08/29/2011 1320   K 6.4* 08/29/2011 1520   CL 94* 08/29/2011 1320   CO2 26 08/29/2011 1320   GLUCOSE 117* 08/29/2011 1320   BUN 17 08/29/2011 1320   CREATININE 1.39* 08/29/2011 1320   CALCIUM 9.6 08/29/2011 1320   GFRNONAA 71* 08/29/2011 1320   GFRAA 83* 08/29/2011 1320    pCXR - LLL infiltrate EKG unavailable  Assessment and Plan:  Acute respiratory failure (08/29/2011)   Assessment: related to aspiration   Plan: ct bipap 13/5, when fully awake, change to NRB & decrease FIO2  Aspiration pneumonia (08/29/2011)   Assessment: LLL   Plan: unasyn , chk blood cx  Rhabdomyolysis/ Acute renal failure (08/29/2011)   Assessment: mild, related to cocaine    Plan: NS @ 200/h after 3L bolus, follow UO, doubt need for bicarb drip  Hyperkalemia - rpt K is 6.4 - no hyperkalemic waves on monitor, Will give bicarb x 1amp, low sugars -hence avoid insulin, calcium only if change in rhythm, rpt BMET at 8p  Polysubstance dependence including opioid type drug, episodic abuse (08/29/2011)   Assessment: Multiple attempts in the past, has been with narcotics anonymous   Plan: Psych assessment when medical issues resolved, Will need sitter when fully awake    Best practices / Disposition: -->ICU status under PCCM -->full code -->Heparin for DVT Px -->Protonix for GI  Px -->bipap -->npo -->family not at bedside but updated by ER RN  The patient is critically ill with multiple organ systems failure and requires high complexity decision making for assessment and support, frequent evaluation and titration of therapies, application of advanced monitoring technologies and extensive interpretation of multiple databases. Critical Care Time devoted to patient care services described in this note is 50 minutes.  ALVA,RAKESH V. 08/29/2011, 5:04 PM

## 2011-08-29 NOTE — ED Notes (Signed)
Pt reports taking heroin and klonopin. Unable to answer very many questions, unable to state if this was an attempt to harm himself. Pt cold, shivering. Respirations even, labored, shallow, tachypneic. Pt placed on cardiac monitor, continuous pulse ox.

## 2011-08-30 DIAGNOSIS — M629 Disorder of muscle, unspecified: Secondary | ICD-10-CM

## 2011-08-30 LAB — CBC
MCH: 27.9 pg (ref 26.0–34.0)
MCV: 82.3 fL (ref 78.0–100.0)
Platelets: 315 10*3/uL (ref 150–400)
RDW: 13 % (ref 11.5–15.5)

## 2011-08-30 LAB — BASIC METABOLIC PANEL
Calcium: 8.4 mg/dL (ref 8.4–10.5)
Creatinine, Ser: 0.99 mg/dL (ref 0.50–1.35)
GFR calc Af Amer: >90 mL/min (ref >90)
GFR calc non Af Amer: >90 mL/min (ref >90)
Sodium: 135 mEq/L (ref 135–145)

## 2011-08-30 LAB — PHOSPHORUS: Phosphorus: 2.5 mg/dL (ref 2.3–4.6)

## 2011-08-30 LAB — MAGNESIUM: Magnesium: 1.7 mg/dL (ref 1.5–2.5)

## 2011-08-30 MED ORDER — POTASSIUM CHLORIDE CRYS ER 20 MEQ PO TBCR
40.0000 meq | EXTENDED_RELEASE_TABLET | Freq: Once | ORAL | Status: AC
  Administered 2011-08-30: 40 meq via ORAL
  Filled 2011-08-30: qty 2

## 2011-08-30 NOTE — ED Notes (Signed)
Patient removed from BiPap and placed on O2 @  2l/min. Will continue to monitor patient to ensure O2 sats are maintained.

## 2011-08-30 NOTE — Progress Notes (Signed)
Name: [name] MRN: [id number] DOB: 02-Mar-1991    LOS: 1  PCCM ADMISSION NOTE  History of Present Illness: 20 y.o with psychoses & polysusbtance abuse, found down in hotel room, hypothermic, hypotensive on arrival. Admitted 11/30 for acute resp failure requiring BiPAP, Lt aspiration pna. Admits to 4 mg klonopin, smoked cocaine on 11/29 & used heroin on 11/30. Labs s/o mild rhabdo. Recent adm to behaviour health 9/12   Lines / Drains: none  Cultures: bld 11/30 >>  Antibiotics: unasyn 11/30 >>  Tests / Events: U DS pos cocaine, heroin, amphetamines(adderall), BDZ (klonopin)  Vital Signs: Temp:  [93 F (33.9 C)-101.7 F (38.7 C)] 100.8 F (38.2 C) (12/01 0315) Pulse Rate:  [102-130] 102  (12/01 0315) Resp:  [16-39] 20  (12/01 0315) BP: (90-132)/(57-101) 118/77 mmHg (12/01 0315) SpO2:  [74 %-100 %] 98 % (12/01 0315) Weight:  [67.3 kg (148 lb 5.9 oz)] 148 lb 5.9 oz (67.3 kg) (12/01 0315) I/O last 3 completed shifts: In: -  Out: 2035 [Urine:2035]   Intake/Output Summary (Last 24 hours) at 08/30/11 0756 Last data filed at 08/30/11 0630  Gross per 24 hour  Intake      0 ml  Output   2035 ml  Net  -2035 ml    Physical Examination: General:  Sleeping , no distress Neuro:  GCS15 when aroused,  nnon focal   HEENT:  Pupils 4mm RL, no pallor, icterus  Neck:  No jvd, LNs   Cardiovascular:  s1s2 rrr resolving tachy 104 at most Lungs:  Improved aeration rt , LLL mild reduced Abdomen:  Soft, non tender Musculoskeletal:  No edema Skin: erythematous popped blisters over back & upper chest  Ventilator settings: off BIPAP this am     Labs and Imaging:  Reviewed.  CBC    Component Value Date/Time   WBC 18.3* 08/30/2011 0525   RBC 5.41 08/30/2011 0525   HGB 15.1 08/30/2011 0525   HCT 44.5 08/30/2011 0525   PLT 315 08/30/2011 0525   MCV 82.3 08/30/2011 0525   MCH 27.9 08/30/2011 0525   MCHC 33.9 08/30/2011 0525   RDW 13.0 08/30/2011 0525   LYMPHSABS 1.8 08/29/2011 1320   MONOABS 2.0* 08/29/2011 1320   EOSABS 0.0 08/29/2011 1320   BASOSABS 0.0 08/29/2011 1320    BMET    Component Value Date/Time   NA 135 08/30/2011 0525   K 3.7 08/30/2011 0525   CL 101 08/30/2011 0525   CO2 27 08/30/2011 0525   GLUCOSE 99 08/30/2011 0525   BUN 14 08/30/2011 0525   CREATININE 0.99 08/30/2011 0525   CALCIUM 8.4 08/30/2011 0525   GFRNONAA >90 08/30/2011 0525   GFRAA >90 08/30/2011 0525    pCXR - LLL infiltrate, improved rt  On 11/30 EKG unavailable  Assessment and Plan:  Acute respiratory failure (08/29/2011)   Assessment: related to aspiration   Plan: Much improved, dc BIPAP, O2 support, pcxr in am to assess LLL, IS  Aspiration pneumonia (08/29/2011)   Assessment: LLL   Plan: unasyn, consider 5 days then dc , chk blood cx, pending?  Rhabdomyolysis/ Acute renal failure (08/29/2011)   Assessment: mild, related to cocaine    Plan: NS @ 200/h after 3L bolus, crt improved, CPK up, saline to 125, no role bicarb!, lasix low threshold to dose x 1 Cpk, lytes in am , K supp  Hyperkalemia - resolved Treat rhabdo  Polysubstance dependence including opioid type drug, episodic abuse (08/29/2011)   Assessment: Multiple attempts in the past,  has been with narcotics anonymous   Plan: Psych assessment when medical issues resolved, Will need sitter when fully awake, wlll add now   Best practices / Disposition: -->ICU status under PCCM -->full code -->Heparin for DVT Px -->Protonix for GI Px, dc in 24 hrs if tolerating diet -->bipap dc -->npo, add diet   FEINSTEIN,DANIEL J. 08/30/2011, 7:56 AM   To floor

## 2011-08-31 LAB — DIFFERENTIAL
Basophils Absolute: 0 10*3/uL (ref 0.0–0.1)
Lymphocytes Relative: 18 % (ref 12–46)
Lymphs Abs: 2.4 10*3/uL (ref 0.7–4.0)
Monocytes Absolute: 1.9 10*3/uL — ABNORMAL HIGH (ref 0.1–1.0)
Monocytes Relative: 15 % — ABNORMAL HIGH (ref 3–12)
Neutro Abs: 8.8 10*3/uL — ABNORMAL HIGH (ref 1.7–7.7)

## 2011-08-31 LAB — CBC
HCT: 43.6 % (ref 39.0–52.0)
Hemoglobin: 14.8 g/dL (ref 13.0–17.0)
RBC: 5.32 MIL/uL (ref 4.22–5.81)
WBC: 13.2 10*3/uL — ABNORMAL HIGH (ref 4.0–10.5)

## 2011-08-31 LAB — BASIC METABOLIC PANEL
BUN: 7 mg/dL (ref 6–23)
CO2: 27 mEq/L (ref 19–32)
Chloride: 105 mEq/L (ref 96–112)
GFR calc Af Amer: >90 mL/min (ref >90)
Potassium: 3.6 mEq/L (ref 3.5–5.1)

## 2011-08-31 LAB — PHOSPHORUS: Phosphorus: 1.5 mg/dL — ABNORMAL LOW (ref 2.3–4.6)

## 2011-08-31 MED ORDER — PANTOPRAZOLE SODIUM 40 MG PO TBEC
40.0000 mg | DELAYED_RELEASE_TABLET | Freq: Every day | ORAL | Status: DC
  Administered 2011-08-31 – 2011-09-09 (×9): 40 mg via ORAL
  Filled 2011-08-31 (×11): qty 1

## 2011-08-31 MED ORDER — K PHOS MONO-SOD PHOS DI & MONO 155-852-130 MG PO TABS
250.0000 mg | ORAL_TABLET | Freq: Two times a day (BID) | ORAL | Status: DC
  Administered 2011-08-31 – 2011-09-07 (×5): 250 mg via ORAL
  Filled 2011-08-31 (×23): qty 1

## 2011-08-31 MED ORDER — CLONAZEPAM 1 MG PO TABS
2.0000 mg | ORAL_TABLET | Freq: Two times a day (BID) | ORAL | Status: DC
  Administered 2011-08-31 – 2011-09-04 (×10): 2 mg via ORAL
  Filled 2011-08-31 (×10): qty 2

## 2011-08-31 MED ORDER — FLUOXETINE HCL 20 MG PO CAPS
20.0000 mg | ORAL_CAPSULE | Freq: Every day | ORAL | Status: DC
  Administered 2011-08-31 – 2011-09-10 (×10): 20 mg via ORAL
  Filled 2011-08-31 (×11): qty 1

## 2011-08-31 NOTE — Progress Notes (Signed)
Patient ID: [name], male   DOB: [birth year], 20 y.o.   MRN: [id number] Subjective: Patient seen.Complain of aches and pains and requesting for his anti-depressant medications  Objective: Weight change: -2.8 kg (-6 lb 2.8 oz)  Intake/Output Summary (Last 24 hours) at 08/31/11 4098 Last data filed at 08/31/11 0500  Gross per 24 hour  Intake   1070 ml  Output   4275 ml  Net  -3205 ml   BP 124/81  Pulse 79  Temp(Src) 98.5 F (36.9 C) (Oral)  Resp 19  Ht 6' (1.829 m)  Wt 65.4 kg (144 lb 2.9 oz)  BMI 19.55 kg/m2  SpO2 96% Physical Exam: General appearance: alert, cooperative and no distress,dehydrated Head: Normocephalic, without obvious abnormality, atraumatic Neck: no adenopathy, no carotid bruit, no JVD, supple, symmetrical, trachea midline and thyroid not enlarged, symmetric, no tenderness/mass/nodules Lungs: clear to auscultation bilaterally Heart: regular rate and rhythm, S1, S2 normal, no murmur, click, rub or gallop Abdomen: soft, non-tender; bowel sounds normal; no masses,  no organomegaly Extremities: extremities normal, atraumatic, no cyanosis or edema Skin: Skin color, texture, turgor normal. No rashes or lesions Neuro-non focal Neuro-psch-appropriate affect  Lab Results: Results for orders placed during the hospital encounter of 08/29/11 (from the past 48 hour(s))  CBC     Status: Abnormal   Collection Time   08/29/11  1:20 PM      Component Value Range Comment   WBC 18.0 (*) 4.0 - 10.5 (K/uL)    RBC 5.90 (*) 4.22 - 5.81 (MIL/uL)    Hemoglobin 16.9  13.0 - 17.0 (g/dL)    HCT 11.9  14.7 - 82.9 (%)    MCV 86.1  78.0 - 100.0 (fL)    MCH 28.6  26.0 - 34.0 (pg)    MCHC 33.3  30.0 - 36.0 (g/dL)    RDW 56.2  13.0 - 86.5 (%)    Platelets 375  150 - 400 (K/uL)   DIFFERENTIAL     Status: Abnormal   Collection Time   08/29/11  1:20 PM      Component Value Range Comment   Neutrophils Relative 79 (*) 43 - 77 (%)    Neutro Abs 14.2 (*) 1.7 - 7.7 (K/uL)    Lymphocytes Relative 10 (*) 12 - 46 (%)    Lymphs Abs 1.8  0.7 - 4.0 (K/uL)    Monocytes Relative 11  3 - 12 (%)    Monocytes Absolute 2.0 (*) 0.1 - 1.0 (K/uL)    Eosinophils Relative 0  0 - 5 (%)    Eosinophils Absolute 0.0  0.0 - 0.7 (K/uL)    Basophils Relative 0  0 - 1 (%)    Basophils Absolute 0.0  0.0 - 0.1 (K/uL)   COMPREHENSIVE METABOLIC PANEL     Status: Abnormal   Collection Time   08/29/11  1:20 PM      Component Value Range Comment   Sodium 135  135 - 145 (mEq/L)    Potassium 6.3 (*) 3.5 - 5.1 (mEq/L)    Chloride 94 (*) 96 - 112 (mEq/L)    CO2 26  19 - 32 (mEq/L)    Glucose, Bld 117 (*) 70 - 99 (mg/dL)    BUN 17  6 - 23 (mg/dL)    Creatinine, Ser 7.84 (*) 0.50 - 1.35 (mg/dL)    Calcium 9.6  8.4 - 10.5 (mg/dL)    Total Protein 8.2  6.0 - 8.3 (g/dL)    Albumin 4.2  3.5 -  5.2 (g/dL)    AST 40 (*) 0 - 37 (U/L)    ALT 38  0 - 53 (U/L)    Alkaline Phosphatase 53  39 - 117 (U/L)    Total Bilirubin 0.3  0.3 - 1.2 (mg/dL)    GFR calc non Af Amer 71 (*) >90 (mL/min)    GFR calc Af Amer 83 (*) >90 (mL/min)   CK     Status: Abnormal   Collection Time   08/29/11  1:20 PM      Component Value Range Comment   Total CK 2221 (*) 7 - 232 (U/L)   ETHANOL     Status: Normal   Collection Time   08/29/11  1:20 PM      Component Value Range Comment   Alcohol, Ethyl (B) <11  0 - 11 (mg/dL)   ACETAMINOPHEN LEVEL     Status: Normal   Collection Time   08/29/11  1:20 PM      Component Value Range Comment   Acetaminophen (Tylenol), Serum <15.0  10 - 30 (ug/mL)   SALICYLATE LEVEL     Status: Abnormal   Collection Time   08/29/11  1:20 PM      Component Value Range Comment   Salicylate Lvl <2.0 (*) 2.8 - 20.0 (mg/dL)   URINALYSIS, ROUTINE W REFLEX MICROSCOPIC     Status: Abnormal   Collection Time   08/29/11  1:43 PM      Component Value Range Comment   Color, Urine YELLOW  YELLOW     APPearance CLOUDY (*) CLEAR     Specific Gravity, Urine 1.019  1.005 - 1.030     pH 5.0  5.0 -  8.0     Glucose, UA NEGATIVE  NEGATIVE (mg/dL)    Hgb urine dipstick SMALL (*) NEGATIVE     Bilirubin Urine NEGATIVE  NEGATIVE     Ketones, ur TRACE (*) NEGATIVE (mg/dL)    Protein, ur NEGATIVE  NEGATIVE (mg/dL)    Urobilinogen, UA 1.0  0.0 - 1.0 (mg/dL)    Nitrite NEGATIVE  NEGATIVE     Leukocytes, UA NEGATIVE  NEGATIVE    URINE RAPID DRUG SCREEN (HOSP PERFORMED)     Status: Abnormal   Collection Time   08/29/11  1:43 PM      Component Value Range Comment   Opiates POSITIVE (*) NONE DETECTED     Cocaine POSITIVE (*) NONE DETECTED     Benzodiazepines POSITIVE (*) NONE DETECTED     Amphetamines POSITIVE (*) NONE DETECTED     Tetrahydrocannabinol NONE DETECTED  NONE DETECTED     Barbiturates NONE DETECTED  NONE DETECTED    TRICYCLICS SCREEN, URINE     Status: Normal   Collection Time   08/29/11  1:43 PM      Component Value Range Comment   TCA Scrn NONE DETECTED  NONE DETECTED    URINE MICROSCOPIC-ADD ON     Status: Abnormal   Collection Time   08/29/11  1:43 PM      Component Value Range Comment   Squamous Epithelial / LPF FEW (*) RARE     RBC / HPF 3-6  <3 (RBC/hpf)    Bacteria, UA FEW (*) RARE     Urine-Other MUCOUS PRESENT     GLUCOSE, CAPILLARY     Status: Normal   Collection Time   08/29/11  1:45 PM      Component Value Range Comment   Glucose-Capillary 95  70 - 99 (  mg/dL)   GLUCOSE, CAPILLARY     Status: Normal   Collection Time   08/29/11  2:20 PM      Component Value Range Comment   Glucose-Capillary 85  70 - 99 (mg/dL)   GLUCOSE, CAPILLARY     Status: Normal   Collection Time   08/29/11  3:10 PM      Component Value Range Comment   Glucose-Capillary 84  70 - 99 (mg/dL)   POTASSIUM     Status: Abnormal   Collection Time   08/29/11  3:20 PM      Component Value Range Comment   Potassium 6.4 (*) 3.5 - 5.1 (mEq/L)   BLOOD GAS, ARTERIAL     Status: Abnormal   Collection Time   08/29/11  3:57 PM      Component Value Range Comment   FIO2 1.00      Delivery  systems OXYGEN MASK      pH, Arterial 7.346 (*) 7.350 - 7.450     pCO2 arterial 44.1  35.0 - 45.0 (mmHg)    pO2, Arterial 57.0 (*) 80.0 - 100.0 (mmHg)    Bicarbonate 23.5  20.0 - 24.0 (mEq/L)    TCO2 20.6  0 - 100 (mmol/L)    Acid-base deficit 1.9  0.0 - 2.0 (mmol/L)    O2 Saturation 89.8      Patient temperature 98.6      Collection site LEFT BRACHIAL      Drawn by [phone]      Sample type ARTERIAL DRAW     GLUCOSE, CAPILLARY     Status: Abnormal   Collection Time   08/29/11  5:21 PM      Component Value Range Comment   Glucose-Capillary 62 (*) 70 - 99 (mg/dL)    Comment 1 Notify RN     GLUCOSE, CAPILLARY     Status: Normal   Collection Time   08/29/11  5:58 PM      Component Value Range Comment   Glucose-Capillary 88  70 - 99 (mg/dL)    Comment 1 Notify RN     GLUCOSE, CAPILLARY     Status: Normal   Collection Time   08/29/11  6:28 PM      Component Value Range Comment   Glucose-Capillary 97  70 - 99 (mg/dL)    Comment 1 Notify RN     BLOOD GAS, ARTERIAL     Status: Abnormal   Collection Time   08/29/11  7:49 PM      Component Value Range Comment   FIO2 1.00      Delivery systems BILEVEL POSITIVE AIRWAY PRESSURE      Inspiratory PAP 12      Expiratory PAP 5      pH, Arterial 7.433  7.350 - 7.450     pCO2 arterial 36.1  35.0 - 45.0 (mmHg)    pO2, Arterial 179.0 (*) 80.0 - 100.0 (mmHg)    Bicarbonate 23.7  20.0 - 24.0 (mEq/L)    TCO2 20.3  0 - 100 (mmol/L)    Acid-Base Excess 0.4  0.0 - 2.0 (mmol/L)    O2 Saturation 99.4      Patient temperature 98.6      Collection site LEFT RADIAL      Drawn by [phone]      Sample type ARTERIAL DRAW      Allens test (pass/fail) PASS  PASS    BASIC METABOLIC PANEL     Status: Abnormal   Collection  Time   08/29/11  8:23 PM      Component Value Range Comment   Sodium 137  135 - 145 (mEq/L)    Potassium 4.7  3.5 - 5.1 (mEq/L)    Chloride 101  96 - 112 (mEq/L)    CO2 28  19 - 32 (mEq/L)    Glucose, Bld 131 (*) 70 - 99 (mg/dL)     BUN 16  6 - 23 (mg/dL)    Creatinine, Ser 1.61  0.50 - 1.35 (mg/dL)    Calcium 8.2 (*) 8.4 - 10.5 (mg/dL)    GFR calc non Af Amer 87 (*) >90 (mL/min)    GFR calc Af Amer >90  >90 (mL/min)   CBC     Status: Abnormal   Collection Time   08/30/11  5:25 AM      Component Value Range Comment   WBC 18.3 (*) 4.0 - 10.5 (K/uL)    RBC 5.41  4.22 - 5.81 (MIL/uL)    Hemoglobin 15.1  13.0 - 17.0 (g/dL)    HCT 09.6  04.5 - 40.9 (%)    MCV 82.3  78.0 - 100.0 (fL)    MCH 27.9  26.0 - 34.0 (pg)    MCHC 33.9  30.0 - 36.0 (g/dL)    RDW 81.1  91.4 - 78.2 (%)    Platelets 315  150 - 400 (K/uL)   BASIC METABOLIC PANEL     Status: Normal   Collection Time   08/30/11  5:25 AM      Component Value Range Comment   Sodium 135  135 - 145 (mEq/L)    Potassium 3.7  3.5 - 5.1 (mEq/L) DELTA CHECK NOTED   Chloride 101  96 - 112 (mEq/L)    CO2 27  19 - 32 (mEq/L)    Glucose, Bld 99  70 - 99 (mg/dL)    BUN 14  6 - 23 (mg/dL)    Creatinine, Ser 9.56  0.50 - 1.35 (mg/dL)    Calcium 8.4  8.4 - 10.5 (mg/dL)    GFR calc non Af Amer >90  >90 (mL/min)    GFR calc Af Amer >90  >90 (mL/min)   MAGNESIUM     Status: Normal   Collection Time   08/30/11  5:25 AM      Component Value Range Comment   Magnesium 1.7  1.5 - 2.5 (mg/dL)   PHOSPHORUS     Status: Normal   Collection Time   08/30/11  5:25 AM      Component Value Range Comment   Phosphorus 2.5  2.3 - 4.6 (mg/dL)   CK     Status: Abnormal   Collection Time   08/30/11  5:25 AM      Component [place] [address] (*) 7 - 232 (U/L)   MRSA PCR SCREENING     Status: Abnormal   Collection Time   08/30/11  9:34 AM      Component Value Range Comment   MRSA by PCR POSITIVE (*) NEGATIVE    PHOSPHORUS     Status: Abnormal   Collection Time   08/31/11  4:04 AM      Component Value Range Comment   Phosphorus 1.5 (*) 2.3 - 4.6 (mg/dL)   MAGNESIUM     Status: Normal   Collection Time   08/31/11  4:04 AM      Component Value Range Comment   Magnesium 2.0   1.5 - 2.5 (mg/dL)  BASIC METABOLIC PANEL     Status: Abnormal   Collection Time   08/31/11  4:04 AM      Component Value Range Comment   Sodium 139  135 - 145 (mEq/L)    Potassium 3.6  3.5 - 5.1 (mEq/L)    Chloride 105  96 - 112 (mEq/L)    CO2 27  19 - 32 (mEq/L)    Glucose, Bld 105 (*) 70 - 99 (mg/dL)    BUN 7  6 - 23 (mg/dL)    Creatinine, Ser 5.36  0.50 - 1.35 (mg/dL)    Calcium 8.8  8.4 - 10.5 (mg/dL)    GFR calc non Af Amer >90  >90 (mL/min)    GFR calc Af Amer >90  >90 (mL/min)   CBC     Status: Abnormal   Collection Time   08/31/11  4:04 AM      Component Value Range Comment   WBC 13.2 (*) 4.0 - 10.5 (K/uL)    RBC 5.32  4.22 - 5.81 (MIL/uL)    Hemoglobin 14.8  13.0 - 17.0 (g/dL)    HCT 64.4  03.4 - 74.2 (%)    MCV 82.0  78.0 - 100.0 (fL)    MCH 27.8  26.0 - 34.0 (pg)    MCHC 33.9  30.0 - 36.0 (g/dL)    RDW 59.5  63.8 - 75.6 (%)    Platelets 318  150 - 400 (K/uL)   DIFFERENTIAL     Status: Abnormal   Collection Time   08/31/11  4:04 AM      Component Value Range Comment   Neutrophils Relative 66  43 - 77 (%)    Neutro Abs 8.8 (*) 1.7 - 7.7 (K/uL)    Lymphocytes Relative 18  12 - 46 (%)    Lymphs Abs 2.4  0.7 - 4.0 (K/uL)    Monocytes Relative 15 (*) 3 - 12 (%)    Monocytes Absolute 1.9 (*) 0.1 - 1.0 (K/uL)    Eosinophils Relative 1  0 - 5 (%)    Eosinophils Absolute 0.1  0.0 - 0.7 (K/uL)    Basophils Relative 0  0 - 1 (%)    Basophils Absolute 0.0  0.0 - 0.1 (K/uL)   CK     Status: Abnormal   Collection Time   08/31/11  4:04 AM      Component Value Range Comment   Total CK 9206 (*) 7 - 232 (U/L)     Micro Results: Recent Results (from the past 240 hour(s))  MRSA PCR SCREENING     Status: Abnormal   Collection Time   08/30/11  9:34 AM      Component Value Range Status Comment   MRSA by PCR POSITIVE (*) NEGATIVE  Final     Studies/Results: Dg Chest Port 1 View  08/29/2011  *RADIOLOGY REPORT*  Clinical Data: Overdose  PORTABLE CHEST - 1 VIEW  Comparison:  Chest x-ray of 06/11/2011  Findings: There is abnormal opacity at the left lung base which may represent pneumonia, possibly due to aspiration.  The right lung is clear.  Heart size is stable.  No bony abnormality is seen.  IMPRESSION: Opacity at the left lung base.  Possible pneumonia as with aspiration.  Original Report Authenticated By: Juline Patch, M.D.   Medications: Scheduled Meds:   . ampicillin-sulbactam (UNASYN) IV  1.5 g Intravenous Q6H  . clonazePAM  2 mg Oral BID  . FLUoxetine  20 mg Oral Daily  .  heparin  5,000 Units Subcutaneous Q8H  . pantoprazole (PROTONIX) IV  40 mg Intravenous Q24H  . phosphorus  250 mg Oral BID   Continuous Infusions:   . sodium chloride 125 mL/hr at 08/30/11 0859   PRN Meds:.  Assessment/Plan: #1 Acute respiratory failure s/p bipap.Patient now tolerating atmospheric oxygen #2 Aspiration pneumonia-continue present anti-biotic regimen #3 Rhabdomyolysis-will continue hydration with normal saline and monitor cpk level #4 Polysubstance abuse #5 Hx of depression/Anxiety disorder-will restart klonopin and prozac   LOS: 2 days   Jhalen Eley 08/31/2011, 9:28 AM

## 2011-09-01 LAB — CBC
HCT: 41.7 % (ref 39.0–52.0)
Hemoglobin: 14.3 g/dL (ref 13.0–17.0)
MCH: 27.6 pg (ref 26.0–34.0)
MCV: 80.3 fL (ref 78.0–100.0)
RBC: 5.19 MIL/uL (ref 4.22–5.81)

## 2011-09-01 LAB — DIFFERENTIAL
Eosinophils Absolute: 0.2 10*3/uL (ref 0.0–0.7)
Lymphs Abs: 2.5 10*3/uL (ref 0.7–4.0)
Monocytes Relative: 14 % — ABNORMAL HIGH (ref 3–12)
Neutrophils Relative %: 65 % (ref 43–77)

## 2011-09-01 LAB — COMPREHENSIVE METABOLIC PANEL
Alkaline Phosphatase: 31 U/L — ABNORMAL LOW (ref 39–117)
BUN: 6 mg/dL (ref 6–23)
GFR calc Af Amer: >90 mL/min (ref >90)
Glucose, Bld: 100 mg/dL — ABNORMAL HIGH (ref 70–99)
Potassium: 3.8 mEq/L (ref 3.5–5.1)
Total Bilirubin: 0.3 mg/dL (ref 0.3–1.2)
Total Protein: 5.9 g/dL — ABNORMAL LOW (ref 6.0–8.3)

## 2011-09-01 LAB — CK: Total CK: 7320 U/L — ABNORMAL HIGH (ref 7–232)

## 2011-09-01 MED ORDER — MUPIROCIN 2 % EX OINT
1.0000 "application " | TOPICAL_OINTMENT | Freq: Two times a day (BID) | CUTANEOUS | Status: AC
  Administered 2011-09-01 – 2011-09-05 (×10): 1 via NASAL
  Filled 2011-09-01 (×2): qty 22

## 2011-09-01 MED ORDER — ZOLPIDEM TARTRATE 10 MG PO TABS
10.0000 mg | ORAL_TABLET | Freq: Every evening | ORAL | Status: DC | PRN
  Administered 2011-09-01 (×2): 10 mg via ORAL
  Filled 2011-09-01 (×2): qty 1

## 2011-09-01 MED ORDER — NICOTINE 21 MG/24HR TD PT24
21.0000 mg | MEDICATED_PATCH | Freq: Every day | TRANSDERMAL | Status: DC
  Administered 2011-09-01 – 2011-09-09 (×9): 21 mg via TRANSDERMAL
  Filled 2011-09-01 (×11): qty 1

## 2011-09-01 MED ORDER — CHLORHEXIDINE GLUCONATE CLOTH 2 % EX PADS
6.0000 | MEDICATED_PAD | Freq: Every day | CUTANEOUS | Status: AC
  Administered 2011-09-02 – 2011-09-04 (×3): 6 via TOPICAL

## 2011-09-01 NOTE — Progress Notes (Signed)
ANTIBIOTIC CONSULT NOTE - FOLLOW UP  Pharmacy Consult for Unasyn Indication: aspiration pneumonia  Allergies  Allergen Reactions  . Seroquel (Quetiapine Fumerate)     "DOESN'T MAKE HIM FEEL GOOD"    Patient Measurements: Height: 6' (182.9 cm) Weight: 144 lb 2.9 oz (65.4 kg) IBW/kg (Calculated) : 77.6   Vital Signs: Temp: 97.8 F (36.6 C) (12/03 0550) Temp src: Oral (12/03 0550) BP: 104/73 mmHg (12/03 0550) Pulse Rate: 67  (12/03 0550) Intake/Output from previous day: 12/02 0701 - 12/03 0700 In: 2830 [P.O.:580; I.V.:2250] Out: 550 [Urine:550] Intake/Output from this shift:    Labs:  Basename 09/01/11 0345 08/31/11 0404 08/30/11 0525  WBC 13.1* 13.2* 18.3*  HGB 14.3 14.8 15.1  PLT 331 318 315  LABCREA -- -- --  CREATININE 0.72 0.80 0.99   Estimated Creatinine Clearance: 135.1 ml/min (by C-G formula based on Cr of 0.72). No results found for this basename: VANCOTROUGH:2,VANCOPEAK:2,VANCORANDOM:2,GENTTROUGH:2,GENTPEAK:2,GENTRANDOM:2,TOBRATROUGH:2,TOBRAPEAK:2,TOBRARND:2,AMIKACINPEAK:2,AMIKACINTROU:2,AMIKACIN:2, in the last 72 hours   Microbiology: Recent Results (from the past 720 hour(s))  MRSA PCR SCREENING     Status: Abnormal   Collection Time   08/30/11  9:34 AM      Component Value Range Status Comment   MRSA by PCR POSITIVE (*) NEGATIVE  Final     Anti-infectives     Start     Dose/Rate Route Frequency Ordered Stop   08/29/11 2000   ampicillin-sulbactam (UNASYN) 1.5 g in sodium chloride 0.9 % 50 mL IVPB        1.5 g 100 mL/hr over 30 Minutes Intravenous Every 6 hours 08/29/11 1806            Assessment: Day #4 Unasyn for aspiration pneumonia. Afebrile. WBC trending downward. Stable renal fxn  Goal of Therapy:  Unasyn adjustment per renal fxn  Plan:  Continue Unasyn 1.5mg  IV q6h.  Recommend change to Augmentin 875mg  PO BID Will sign off  Berkley Harvey 09/01/2011,8:05 AM [phone]

## 2011-09-01 NOTE — Progress Notes (Signed)
Patient ID: [name], male   DOB: 08-15-1991, 20 y.o.   MRN: [id number] Patient ID: [name], male   DOB: 02-Dec-1990, 20 y.o.   MRN: [id number] Subjective: Patient seen.No complains  Objective: Weight change:   Intake/Output Summary (Last 24 hours) at 09/01/11 1612 Last data filed at 09/01/11 1611  Gross per 24 hour  Intake   2950 ml  Output    550 ml  Net   2400 ml   BP 110/79  Pulse 88  Temp(Src) 98.1 F (36.7 C) (Oral)  Resp 18  Ht 6' (1.829 m)  Wt 65.4 kg (144 lb 2.9 oz)  BMI 19.55 kg/m2  SpO2 97% Physical Exam: General appearance: alert, cooperative and no distress,dehydration improved Head: Normocephalic, without obvious abnormality, atraumatic Neck: no adenopathy, no carotid bruit, no JVD, supple, symmetrical, trachea midline and thyroid not enlarged, symmetric, no tenderness/mass/nodules Lungs: clear to auscultation bilaterally Heart: regular rate and rhythm, S1, S2 normal, no murmur, click, rub or gallop Abdomen: soft, non-tender; bowel sounds normal; no masses,  no organomegaly Extremities: extremities normal, atraumatic, no cyanosis or edema Skin: improved turgor Neuro-non focal Neuro-psch-appropriate affect  Lab Results: Results for orders placed during the hospital encounter of 08/29/11 (from the past 48 hour(s))  CBC     Status: Abnormal   Collection Time   08/29/11  1:20 PM      Component Value Range Comment   WBC 18.0 (*) 4.0 - 10.5 (K/uL)    RBC 5.90 (*) 4.22 - 5.81 (MIL/uL)    Hemoglobin 16.9  13.0 - 17.0 (g/dL)    HCT 56.2  13.0 - 86.5 (%)    MCV 86.1  78.0 - 100.0 (fL)    MCH 28.6  26.0 - 34.0 (pg)    MCHC 33.3  30.0 - 36.0 (g/dL)    RDW 78.4  69.6 - 29.5 (%)    Platelets 375  150 - 400 (K/uL)   DIFFERENTIAL     Status: Abnormal   Collection Time   08/29/11  1:20 PM      Component Value Range Comment   Neutrophils Relative 79 (*) 43 - 77 (%)    Neutro Abs 14.2 (*) 1.7 - 7.7 (K/uL)    Lymphocytes Relative 10 (*) 12 - 46 (%)    Lymphs  Abs 1.8  0.7 - 4.0 (K/uL)    Monocytes Relative 11  3 - 12 (%)    Monocytes Absolute 2.0 (*) 0.1 - 1.0 (K/uL)    Eosinophils Relative 0  0 - 5 (%)    Eosinophils Absolute 0.0  0.0 - 0.7 (K/uL)    Basophils Relative 0  0 - 1 (%)    Basophils Absolute 0.0  0.0 - 0.1 (K/uL)   COMPREHENSIVE METABOLIC PANEL     Status: Abnormal   Collection Time   08/29/11  1:20 PM      Component Value Range Comment   Sodium 135  135 - 145 (mEq/L)    Potassium 6.3 (*) 3.5 - 5.1 (mEq/L)    Chloride 94 (*) 96 - 112 (mEq/L)    CO2 26  19 - 32 (mEq/L)    Glucose, Bld 117 (*) 70 - 99 (mg/dL)    BUN 17  6 - 23 (mg/dL)    Creatinine, Ser 2.84 (*) 0.50 - 1.35 (mg/dL)    Calcium 9.6  8.4 - 10.5 (mg/dL)    Total Protein 8.2  6.0 - 8.3 (g/dL)    Albumin 4.2  3.5 - 5.2 (  g/dL)    AST 40 (*) 0 - 37 (U/L)    ALT 38  0 - 53 (U/L)    Alkaline Phosphatase 53  39 - 117 (U/L)    Total Bilirubin 0.3  0.3 - 1.2 (mg/dL)    GFR calc non Af Amer 71 (*) >90 (mL/min)    GFR calc Af Amer 83 (*) >90 (mL/min)   CK     Status: Abnormal   Collection Time   08/29/11  1:20 PM      Component Value Range Comment   Total CK 2221 (*) 7 - 232 (U/L)   ETHANOL     Status: Normal   Collection Time   08/29/11  1:20 PM      Component Value Range Comment   Alcohol, Ethyl (B) <11  0 - 11 (mg/dL)   ACETAMINOPHEN LEVEL     Status: Normal   Collection Time   08/29/11  1:20 PM      Component Value Range Comment   Acetaminophen (Tylenol), Serum <15.0  10 - 30 (ug/mL)   SALICYLATE LEVEL     Status: Abnormal   Collection Time   08/29/11  1:20 PM      Component Value Range Comment   Salicylate Lvl <2.0 (*) 2.8 - 20.0 (mg/dL)   URINALYSIS, ROUTINE W REFLEX MICROSCOPIC     Status: Abnormal   Collection Time   08/29/11  1:43 PM      Component Value Range Comment   Color, Urine YELLOW  YELLOW     APPearance CLOUDY (*) CLEAR     Specific Gravity, Urine 1.019  1.005 - 1.030     pH 5.0  5.0 - 8.0     Glucose, UA NEGATIVE  NEGATIVE (mg/dL)     Hgb urine dipstick SMALL (*) NEGATIVE     Bilirubin Urine NEGATIVE  NEGATIVE     Ketones, ur TRACE (*) NEGATIVE (mg/dL)    Protein, ur NEGATIVE  NEGATIVE (mg/dL)    Urobilinogen, UA 1.0  0.0 - 1.0 (mg/dL)    Nitrite NEGATIVE  NEGATIVE     Leukocytes, UA NEGATIVE  NEGATIVE    URINE RAPID DRUG SCREEN (HOSP PERFORMED)     Status: Abnormal   Collection Time   08/29/11  1:43 PM      Component Value Range Comment   Opiates POSITIVE (*) NONE DETECTED     Cocaine POSITIVE (*) NONE DETECTED     Benzodiazepines POSITIVE (*) NONE DETECTED     Amphetamines POSITIVE (*) NONE DETECTED     Tetrahydrocannabinol NONE DETECTED  NONE DETECTED     Barbiturates NONE DETECTED  NONE DETECTED    TRICYCLICS SCREEN, URINE     Status: Normal   Collection Time   08/29/11  1:43 PM      Component Value Range Comment   TCA Scrn NONE DETECTED  NONE DETECTED    URINE MICROSCOPIC-ADD ON     Status: Abnormal   Collection Time   08/29/11  1:43 PM      Component Value Range Comment   Squamous Epithelial / LPF FEW (*) RARE     RBC / HPF 3-6  <3 (RBC/hpf)    Bacteria, UA FEW (*) RARE     Urine-Other MUCOUS PRESENT     GLUCOSE, CAPILLARY     Status: Normal   Collection Time   08/29/11  1:45 PM      Component Value Range Comment   Glucose-Capillary 95  70 - 99 (mg/dL)  GLUCOSE, CAPILLARY     Status: Normal   Collection Time   08/29/11  2:20 PM      Component Value Range Comment   Glucose-Capillary 85  70 - 99 (mg/dL)   GLUCOSE, CAPILLARY     Status: Normal   Collection Time   08/29/11  3:10 PM      Component Value Range Comment   Glucose-Capillary 84  70 - 99 (mg/dL)   POTASSIUM     Status: Abnormal   Collection Time   08/29/11  3:20 PM      Component Value Range Comment   Potassium 6.4 (*) 3.5 - 5.1 (mEq/L)   BLOOD GAS, ARTERIAL     Status: Abnormal   Collection Time   08/29/11  3:57 PM      Component Value Range Comment   FIO2 1.00      Delivery systems OXYGEN MASK      pH, Arterial 7.346 (*) 7.350  - 7.450     pCO2 arterial 44.1  35.0 - 45.0 (mmHg)    pO2, Arterial 57.0 (*) 80.0 - 100.0 (mmHg)    Bicarbonate 23.5  20.0 - 24.0 (mEq/L)    TCO2 20.6  0 - 100 (mmol/L)    Acid-base deficit 1.9  0.0 - 2.0 (mmol/L)    O2 Saturation 89.8      Patient temperature 98.6      Collection site LEFT BRACHIAL      Drawn by [phone]      Sample type ARTERIAL DRAW     GLUCOSE, CAPILLARY     Status: Abnormal   Collection Time   08/29/11  5:21 PM      Component Value Range Comment   Glucose-Capillary 62 (*) 70 - 99 (mg/dL)    Comment 1 Notify RN     GLUCOSE, CAPILLARY     Status: Normal   Collection Time   08/29/11  5:58 PM      Component Value Range Comment   Glucose-Capillary 88  70 - 99 (mg/dL)    Comment 1 Notify RN     GLUCOSE, CAPILLARY     Status: Normal   Collection Time   08/29/11  6:28 PM      Component Value Range Comment   Glucose-Capillary 97  70 - 99 (mg/dL)    Comment 1 Notify RN     BLOOD GAS, ARTERIAL     Status: Abnormal   Collection Time   08/29/11  7:49 PM      Component Value Range Comment   FIO2 1.00      Delivery systems BILEVEL POSITIVE AIRWAY PRESSURE      Inspiratory PAP 12      Expiratory PAP 5      pH, Arterial 7.433  7.350 - 7.450     pCO2 arterial 36.1  35.0 - 45.0 (mmHg)    pO2, Arterial 179.0 (*) 80.0 - 100.0 (mmHg)    Bicarbonate 23.7  20.0 - 24.0 (mEq/L)    TCO2 20.3  0 - 100 (mmol/L)    Acid-Base Excess 0.4  0.0 - 2.0 (mmol/L)    O2 Saturation 99.4      Patient temperature 98.6      Collection site LEFT RADIAL      Drawn by [id number]      Sample type ARTERIAL DRAW      Allens test (pass/fail) PASS  PASS    BASIC METABOLIC PANEL     Status: Abnormal   Collection Time  08/29/11  8:23 PM      Component Value Range Comment   Sodium 137  135 - 145 (mEq/L)    Potassium 4.7  3.5 - 5.1 (mEq/L)    Chloride 101  96 - 112 (mEq/L)    CO2 28  19 - 32 (mEq/L)    Glucose, Bld 131 (*) 70 - 99 (mg/dL)    BUN 16  6 - 23 (mg/dL)    Creatinine, Ser 1.61  0.50 -  1.35 (mg/dL)    Calcium 8.2 (*) 8.4 - 10.5 (mg/dL)    GFR calc non Af Amer 87 (*) >90 (mL/min)    GFR calc Af Amer >90  >90 (mL/min)   CBC     Status: Abnormal   Collection Time   08/30/11  5:25 AM      Component Value Range Comment   WBC 18.3 (*) 4.0 - 10.5 (K/uL)    RBC 5.41  4.22 - 5.81 (MIL/uL)    Hemoglobin 15.1  13.0 - 17.0 (g/dL)    HCT 09.6  04.5 - 40.9 (%)    MCV 82.3  78.0 - 100.0 (fL)    MCH 27.9  26.0 - 34.0 (pg)    MCHC 33.9  30.0 - 36.0 (g/dL)    RDW 81.1  91.4 - 78.2 (%)    Platelets 315  150 - 400 (K/uL)   BASIC METABOLIC PANEL     Status: Normal   Collection Time   08/30/11  5:25 AM      Component Value Range Comment   Sodium 135  135 - 145 (mEq/L)    Potassium 3.7  3.5 - 5.1 (mEq/L) DELTA CHECK NOTED   Chloride 101  96 - 112 (mEq/L)    CO2 27  19 - 32 (mEq/L)    Glucose, Bld 99  70 - 99 (mg/dL)    BUN 14  6 - 23 (mg/dL)    Creatinine, Ser 9.56  0.50 - 1.35 (mg/dL)    Calcium 8.4  8.4 - 10.5 (mg/dL)    GFR calc non Af Amer >90  >90 (mL/min)    GFR calc Af Amer >90  >90 (mL/min)   MAGNESIUM     Status: Normal   Collection Time   08/30/11  5:25 AM      Component Value Range Comment   Magnesium 1.7  1.5 - 2.5 (mg/dL)   PHOSPHORUS     Status: Normal   Collection Time   08/30/11  5:25 AM      Component Value Range Comment   Phosphorus 2.5  2.3 - 4.6 (mg/dL)   CK     Status: Abnormal   Collection Time   08/30/11  5:25 AM      Component Value [place] [address] (*) 7 - 232 (U/L)   MRSA PCR SCREENING     Status: Abnormal   Collection Time   08/30/11  9:34 AM      Component Value Range Comment   MRSA by PCR POSITIVE (*) NEGATIVE    PHOSPHORUS     Status: Abnormal   Collection Time   08/31/11  4:04 AM      Component Value Range Comment   Phosphorus 1.5 (*) 2.3 - 4.6 (mg/dL)   MAGNESIUM     Status: Normal   Collection Time   08/31/11  4:04 AM      Component Value Range Comment   Magnesium 2.0  1.5 - 2.5 (mg/dL)   BASIC METABOLIC  PANEL     Status:  Abnormal   Collection Time   08/31/11  4:04 AM      Component Value Range Comment   Sodium 139  135 - 145 (mEq/L)    Potassium 3.6  3.5 - 5.1 (mEq/L)    Chloride 105  96 - 112 (mEq/L)    CO2 27  19 - 32 (mEq/L)    Glucose, Bld 105 (*) 70 - 99 (mg/dL)    BUN 7  6 - 23 (mg/dL)    Creatinine, Ser 9.60  0.50 - 1.35 (mg/dL)    Calcium 8.8  8.4 - 10.5 (mg/dL)    GFR calc non Af Amer >90  >90 (mL/min)    GFR calc Af Amer >90  >90 (mL/min)   CBC     Status: Abnormal   Collection Time   08/31/11  4:04 AM      Component Value Range Comment   WBC 13.2 (*) 4.0 - 10.5 (K/uL)    RBC 5.32  4.22 - 5.81 (MIL/uL)    Hemoglobin 14.8  13.0 - 17.0 (g/dL)    HCT 45.4  09.8 - 11.9 (%)    MCV 82.0  78.0 - 100.0 (fL)    MCH 27.8  26.0 - 34.0 (pg)    MCHC 33.9  30.0 - 36.0 (g/dL)    RDW 14.7  82.9 - 56.2 (%)    Platelets 318  150 - 400 (K/uL)   DIFFERENTIAL     Status: Abnormal   Collection Time   08/31/11  4:04 AM      Component Value Range Comment   Neutrophils Relative 66  43 - 77 (%)    Neutro Abs 8.8 (*) 1.7 - 7.7 (K/uL)    Lymphocytes Relative 18  12 - 46 (%)    Lymphs Abs 2.4  0.7 - 4.0 (K/uL)    Monocytes Relative 15 (*) 3 - 12 (%)    Monocytes Absolute 1.9 (*) 0.1 - 1.0 (K/uL)    Eosinophils Relative 1  0 - 5 (%)    Eosinophils Absolute 0.1  0.0 - 0.7 (K/uL)    Basophils Relative 0  0 - 1 (%)    Basophils Absolute 0.0  0.0 - 0.1 (K/uL)   CK     Status: Abnormal   Collection Time   08/31/11  4:04 AM      Component Value Range Comment   Total CK 9206 (*) 7 - 232 (U/L)     Micro Results: Recent Results (from the past 240 hour(s))  MRSA PCR SCREENING     Status: Abnormal   Collection Time   08/30/11  9:34 AM      Component Value Range Status Comment   MRSA by PCR POSITIVE (*) NEGATIVE  Final     Studies/Results: Dg Chest Port 1 View  08/29/2011  *RADIOLOGY REPORT*  Clinical Data: Overdose  PORTABLE CHEST - 1 VIEW  Comparison: Chest x-ray of 06/11/2011  Findings: There is abnormal  opacity at the left lung base which may represent pneumonia, possibly due to aspiration.  The right lung is clear.  Heart size is stable.  No bony abnormality is seen.  IMPRESSION: Opacity at the left lung base.  Possible pneumonia as with aspiration.  Original Report Authenticated By: Juline Patch, M.D.   Medications: Scheduled Meds:    . ampicillin-sulbactam (UNASYN) IV  1.5 g Intravenous Q6H  . Chlorhexidine Gluconate Cloth  6 each Topical Q0600  . clonazePAM  2 mg  Oral BID  . FLUoxetine  20 mg Oral Daily  . heparin  5,000 Units Subcutaneous Q8H  . mupirocin ointment  1 application Nasal BID  . pantoprazole  40 mg Oral Q1200  . phosphorus  250 mg Oral BID   Continuous Infusions:    . sodium chloride 125 mL/hr at 09/01/11 1339   PRN Meds:.zolpidem  Assessment/Plan: #1 Acute respiratory failure s/p bipap.Patient now tolerating atmospheric oxygen #2 Aspiration pneumonia-continue present anti-biotic regimen #3 Rhabdomyolysis-will continue hydration with normal saline and monitor cpk level #4 Polysubstance abuse #5 Hx of depression/Anxiety disorder-continue klonopin and prozac Psychiatrist consulted today   LOS: 3 days   Tyra Michelle 09/01/2011, 4:12 PM

## 2011-09-02 ENCOUNTER — Inpatient Hospital Stay (HOSPITAL_COMMUNITY): Payer: Medicaid Other

## 2011-09-02 DIAGNOSIS — T401X4A Poisoning by heroin, undetermined, initial encounter: Principal | ICD-10-CM

## 2011-09-02 LAB — CBC
MCH: 27.7 pg (ref 26.0–34.0)
MCHC: 34 g/dL (ref 30.0–36.0)
Platelets: 357 10*3/uL (ref 150–400)
RBC: 5.35 MIL/uL (ref 4.22–5.81)
RDW: 12.9 % (ref 11.5–15.5)

## 2011-09-02 LAB — COMPREHENSIVE METABOLIC PANEL
ALT: 104 U/L — ABNORMAL HIGH (ref 0–53)
AST: 165 U/L — ABNORMAL HIGH (ref 0–37)
Albumin: 2.9 g/dL — ABNORMAL LOW (ref 3.5–5.2)
Calcium: 9.3 mg/dL (ref 8.4–10.5)
Sodium: 140 mEq/L (ref 135–145)
Total Protein: 6.3 g/dL (ref 6.0–8.3)

## 2011-09-02 MED ORDER — CLONIDINE HCL 0.1 MG PO TABS: 0.1000 mg | ORAL_TABLET | Freq: Every day | ORAL | Status: DC

## 2011-09-02 MED ORDER — CLONIDINE HCL ER 0.1 MG PO TB12: 0.2000 mg | ORAL_TABLET | Freq: Four times a day (QID) | ORAL | Status: DC

## 2011-09-02 MED ORDER — METHADONE HCL 10 MG PO TABS
20.0000 mg | ORAL_TABLET | Freq: Three times a day (TID) | ORAL | Status: DC
  Administered 2011-09-02 – 2011-09-04 (×9): 20 mg via ORAL
  Filled 2011-09-02 (×7): qty 2
  Filled 2011-09-02 (×2): qty 1
  Filled 2011-09-02 (×3): qty 2

## 2011-09-02 MED ORDER — CLONIDINE HCL 0.1 MG PO TABS: 0.1000 mg | ORAL_TABLET | Freq: Four times a day (QID) | ORAL | Status: DC

## 2011-09-02 MED ORDER — DICYCLOMINE HCL 20 MG PO TABS
20.0000 mg | ORAL_TABLET | ORAL | Status: DC | PRN
  Filled 2011-09-02: qty 1

## 2011-09-02 MED ORDER — METHOCARBAMOL 500 MG PO TABS: 500.0000 mg | ORAL_TABLET | Freq: Three times a day (TID) | ORAL | Status: DC | PRN

## 2011-09-02 MED ORDER — DEXTROSE 5 % IV SOLN: 500.0000 mg | INTRAVENOUS | Status: DC

## 2011-09-02 MED ORDER — HYDROXYZINE HCL 25 MG PO TABS
25.0000 mg | ORAL_TABLET | Freq: Four times a day (QID) | ORAL | Status: DC | PRN
  Filled 2011-09-02: qty 1

## 2011-09-02 MED ORDER — CLONIDINE HCL 0.1 MG PO TABS: 0.1000 mg | ORAL_TABLET | ORAL | Status: DC

## 2011-09-02 MED ORDER — LORAZEPAM 2 MG/ML IJ SOLN: 2.0000 mg | Freq: Once | INTRAMUSCULAR | Status: DC

## 2011-09-02 MED ORDER — CLONIDINE HCL 0.1 MG PO TABS
0.1000 mg | ORAL_TABLET | Freq: Four times a day (QID) | ORAL | Status: DC
  Filled 2011-09-02 (×5): qty 1

## 2011-09-02 MED ORDER — ONDANSETRON 4 MG PO TBDP
4.0000 mg | ORAL_TABLET | Freq: Four times a day (QID) | ORAL | Status: DC | PRN
  Filled 2011-09-02: qty 1

## 2011-09-02 MED ORDER — AZITHROMYCIN 250 MG PO TABS
250.0000 mg | ORAL_TABLET | Freq: Every day | ORAL | Status: DC
  Administered 2011-09-02: 250 mg via ORAL
  Filled 2011-09-02 (×2): qty 1

## 2011-09-02 MED ORDER — LORAZEPAM 2 MG/ML IJ SOLN
1.0000 mg | Freq: Once | INTRAMUSCULAR | Status: AC
  Administered 2011-09-02: 1 mg via INTRAMUSCULAR
  Filled 2011-09-02: qty 1

## 2011-09-02 MED ORDER — LOPERAMIDE HCL 2 MG PO CAPS: 2.0000 mg | ORAL_CAPSULE | ORAL | Status: DC | PRN

## 2011-09-02 MED ORDER — NAPROXEN 500 MG PO TABS: 500.0000 mg | ORAL_TABLET | Freq: Two times a day (BID) | ORAL | Status: DC | PRN

## 2011-09-02 NOTE — Progress Notes (Signed)
Spoke with Pt's mom, Tamela Gammon.  Per mom, Pt continues to use and she is concerned that he's using daily due to Pt cutting off contact with her.  Mom concerned that Pt is going to unintentionally kill self by OD.  Mom attempted to have him committed and Pt was sent to [facility] and was subsequently released without any tx.  Per mom, Pt needs to be committed because he continues to make poor decisions.  Mom attempted to get medical POA at last hospitalization and Pt refused.  CSW to continue to follow.  Providence Crosby, LCSWA Clinical Social Work [phone]

## 2011-09-02 NOTE — Consult Note (Signed)
Patient Identification:  Arna Snipe Date of Evaluation:  09/02/2011   History of Present Illness: 20 year old Caucasian male with long history of polysubstance dependence was found in a hotel hypothermic and hypotensive. Patient reported that he lost his job and his house he was under a lot of stress that's why he relapsed. Currently patient is looking forward inpatient rehabilitation. [facility]. I discussed with him that the he is a good candidate for methadone program and patient agrees with that and I told the clinical social worker to discuss with him about different programs available in the outpatient setting.  Mental status examination-patient is calm cooperative and pleasant on approach. No psychomotor agitation retardation present. Speech is normal in rate rhythm and volume mood euthymic affect mood congruent thought process logical and goal-directed. Thought content not suicidal or homicidal not delusional. Thought perception audiovisual hallucinations reported. Cognition alert awake oriented x3. Memory immediate recent remote intact. Attention concentration good. Abstract ability good insight and judgment intact.     Past Medical History:     Past Medical History  Diagnosis Date  . Anxiety   . Depression   . Hypertension   . Pneumonia     aspiration  . Non-ST elevation MI (NSTEMI) 05/2011  . Seizures 2010  . Shock     History of  . Respiratory failure     History of  . Rhabdomyolysis     History of  . Overdose     unintentional  . History of hyperkalemia        Past Surgical History  Procedure Date  . Mandible fracture surgery     age 19    Allergies:  Allergies  Allergen Reactions  . Seroquel (Quetiapine Fumerate)     "DOESN'T MAKE HIM FEEL GOOD"    Current Medications:  Prior to Admission medications   Medication Sig Start Date End Date Taking? Authorizing Provider    amphetamine-dextroamphetamine (ADDERALL XR) 20 MG 24 hr capsule Take 20 mg by mouth 2 (two) times daily.    Yes Historical Provider, MD  clonazePAM (KLONOPIN) 0.5 MG tablet Take 2 mg by mouth 2 (two) times daily as needed.    Yes Historical Provider, MD  clonazePAM (KLONOPIN) 2 MG tablet Take 2 mg by mouth 2 (two) times daily as needed.     Yes Historical Provider, MD  FLUoxetine (PROZAC) 10 MG capsule Take 20 mg by mouth daily.    Yes Historical Provider, MD  zolpidem (AMBIEN) 10 MG tablet Take 10 mg by mouth at bedtime as needed.     Yes Historical Provider, MD  amphetamine-dextroamphetamine (ADDERALL XR) 20 MG 24 hr capsule Take 20 mg by mouth every morning.      Historical Provider, MD  FLUoxetine (PROZAC) 20 MG capsule Take 20 mg by mouth daily. TAKE 3 CAPSULES BY MOUTH EVERY MORNING     Historical Provider, MD    Social History:    reports that he has been smoking Cigarettes.  He has a 9 pack-year smoking history. He has never used smokeless tobacco. He reports that he drinks about .6 ounces of alcohol per week. He reports that he uses illicit drugs ("Crack" cocaine, Heroin, and Benzodiazepines).   Family History:    History reviewed. No pertinent family history.  DIAGNOSIS:   AXIS I  Polysubstance dependence   AXIS II  Deffered  AXIS III See medical notes.  AXIS IV  not working, homeless   AXIS  V 51-60 moderate symptoms     Recommendations: #1 supportive psychotherapy and psychoeducation given to the patient.  #2 patient wants to go to inpatient rehabilitation once medically stable he can be transferred there. #3 no withdrawals symptoms present.     Eulogio Ditch, MD

## 2011-09-02 NOTE — Progress Notes (Signed)
Yellow note completed and in shadow chart.  Social Hx: Pt has extensive hx of heroin use.  Pt states that he no longer uses daily, however when he does use, it's in large amounts.  Pt currently smokes and has for many years.  Pt denies significant ETOH use.  Pt currently homeless, although he may be able to stay with grandmom or mom.   Pt has never been married.  Poor support system.  Strained relationships with mom and father, as they are "addicts", per Pt.  Pt a resident of [address], by hx, but owes back rent in the amt of $580.  [place] but feels that he's probably fired due to this hospitalization.  Has many skills; feels he's very employable.    Psych Hx: Pt has received inpt tx at [facility] in May of 2012.  Pt then transferred to [facility].  Pt currently receives outpt tx at [facility], including group therapy and med mgmt.  Pt amenable to individual therapy and methadone mgmt. Pt denies hx of SI, HI, AVH, paranoid, delusions.  Recent Loss:  Pt recently gave his 86-year-old son up for adoption, although this was not done through the court system.  Son now lives in [place].  Collateral Info: Attempted to contact grandmom, Lilyan Punt, at [phone].  LM.  Attempted to contact mom, Tamela Gammon, at [phone].  LM.  Summary: Pt admitted due to unintentional heroin OD.  Pt has significant heroin use, along with an OD.  Pt has had inpt psych/S.A tx and is currently getting outpt tx.  Pt calm, cooperative, presented with appropriate affect, loquacious.  Provided Pt with outpt S.A and mental health tx. centers/facilities.  Provided Pt with homeless shelter info.  Pt would like to get a job and return to [employer].  Long-term goal is to go to nursing school.  CSW to f/u.  Providence Crosby, LCSWA Clinical Social Work [phone]

## 2011-09-02 NOTE — Progress Notes (Signed)
At 0145 pt's iv infiltrated, pt refused a new iv stick. He was made aware that he would no longer be able to receive his iv abx and ivf. He understood this and still refused a new iv. At 0215 pt called me to room, stating that he felt he was starting to go through withdrawal related to his drug use. He requested I call the MD for some medicine to help him. MD on call paged and made aware, and new orders received.

## 2011-09-02 NOTE — Progress Notes (Signed)
Patient ID: [name], male   DOB: [birth year], 20 y.o.   MRN: [id number] Patient ID: [name], male   DOB: [birth year], 20 y.o.   MRN: [id number] Patient ID: [name], male   DOB: 15-Sep-1991, 20 y.o.   MRN: [id number] Subjective: Patient seen.Complain of pain and withdrawal symptoms.  Objective: Weight change:   Intake/Output Summary (Last 24 hours) at 09/02/11 0655 Last data filed at 09/02/11 0100  Gross per 24 hour  Intake 3753.75 ml  Output      0 ml  Net 3753.75 ml   BP 106/66  Pulse 58  Temp(Src) 98.6 F (37 C) (Oral)  Resp 20  Ht 6' (1.829 m)  Wt 65.3 kg (143 lb 15.4 oz)  BMI 19.52 kg/m2  SpO2 99% Physical Exam: General appearance: alert, cooperative and no distress,dehydration improved Head: Normocephalic, without obvious abnormality, atraumatic Neck: no adenopathy, no carotid bruit, no JVD, supple, symmetrical, trachea midline and thyroid not enlarged, symmetric, no tenderness/mass/nodules Lungs: clear to auscultation bilaterally Heart: regular rate and rhythm, S1, S2 normal, no murmur, click, rub or gallop Abdomen: soft, non-tender; bowel sounds normal; no masses,  no organomegaly Extremities: extremities normal, atraumatic, no cyanosis or edema Skin: improved turgor Neuro-non focal Neuro-psch-appropriate affect but verbose  Lab Results: Results for orders placed during the hospital encounter of 08/29/11 (from the past 48 hour(s))  CBC     Status: Abnormal   Collection Time   08/29/11  1:20 PM      Component Value Range Comment   WBC 18.0 (*) 4.0 - 10.5 (K/uL)    RBC 5.90 (*) 4.22 - 5.81 (MIL/uL)    Hemoglobin 16.9  13.0 - 17.0 (g/dL)    HCT 40.1  02.7 - 25.3 (%)    MCV 86.1  78.0 - 100.0 (fL)    MCH 28.6  26.0 - 34.0 (pg)    MCHC 33.3  30.0 - 36.0 (g/dL)    RDW 66.4  40.3 - 47.4 (%)    Platelets 375  150 - 400 (K/uL)   DIFFERENTIAL     Status: Abnormal   Collection Time   08/29/11  1:20 PM      Component Value Range Comment   Neutrophils  Relative 79 (*) 43 - 77 (%)    Neutro Abs 14.2 (*) 1.7 - 7.7 (K/uL)    Lymphocytes Relative 10 (*) 12 - 46 (%)    Lymphs Abs 1.8  0.7 - 4.0 (K/uL)    Monocytes Relative 11  3 - 12 (%)    Monocytes Absolute 2.0 (*) 0.1 - 1.0 (K/uL)    Eosinophils Relative 0  0 - 5 (%)    Eosinophils Absolute 0.0  0.0 - 0.7 (K/uL)    Basophils Relative 0  0 - 1 (%)    Basophils Absolute 0.0  0.0 - 0.1 (K/uL)   COMPREHENSIVE METABOLIC PANEL     Status: Abnormal   Collection Time   08/29/11  1:20 PM      Component Value Range Comment   Sodium 135  135 - 145 (mEq/L)    Potassium 6.3 (*) 3.5 - 5.1 (mEq/L)    Chloride 94 (*) 96 - 112 (mEq/L)    CO2 26  19 - 32 (mEq/L)    Glucose, Bld 117 (*) 70 - 99 (mg/dL)    BUN 17  6 - 23 (mg/dL)    Creatinine, Ser 2.59 (*) 0.50 - 1.35 (mg/dL)    Calcium 9.6  8.4 - 10.5 (mg/dL)  Total Protein 8.2  6.0 - 8.3 (g/dL)    Albumin 4.2  3.5 - 5.2 (g/dL)    AST 40 (*) 0 - 37 (U/L)    ALT 38  0 - 53 (U/L)    Alkaline Phosphatase 53  39 - 117 (U/L)    Total Bilirubin 0.3  0.3 - 1.2 (mg/dL)    GFR calc non Af Amer 71 (*) >90 (mL/min)    GFR calc Af Amer 83 (*) >90 (mL/min)   CK     Status: Abnormal   Collection Time   08/29/11  1:20 PM      Component Value Range Comment   Total CK 2221 (*) 7 - 232 (U/L)   ETHANOL     Status: Normal   Collection Time   08/29/11  1:20 PM      Component Value Range Comment   Alcohol, Ethyl (B) <11  0 - 11 (mg/dL)   ACETAMINOPHEN LEVEL     Status: Normal   Collection Time   08/29/11  1:20 PM      Component Value Range Comment   Acetaminophen (Tylenol), Serum <15.0  10 - 30 (ug/mL)   SALICYLATE LEVEL     Status: Abnormal   Collection Time   08/29/11  1:20 PM      Component Value Range Comment   Salicylate Lvl <2.0 (*) 2.8 - 20.0 (mg/dL)   URINALYSIS, ROUTINE W REFLEX MICROSCOPIC     Status: Abnormal   Collection Time   08/29/11  1:43 PM      Component Value Range Comment   Color, Urine YELLOW  YELLOW     APPearance CLOUDY (*)  CLEAR     Specific Gravity, Urine 1.019  1.005 - 1.030     pH 5.0  5.0 - 8.0     Glucose, UA NEGATIVE  NEGATIVE (mg/dL)    Hgb urine dipstick SMALL (*) NEGATIVE     Bilirubin Urine NEGATIVE  NEGATIVE     Ketones, ur TRACE (*) NEGATIVE (mg/dL)    Protein, ur NEGATIVE  NEGATIVE (mg/dL)    Urobilinogen, UA 1.0  0.0 - 1.0 (mg/dL)    Nitrite NEGATIVE  NEGATIVE     Leukocytes, UA NEGATIVE  NEGATIVE    URINE RAPID DRUG SCREEN (HOSP PERFORMED)     Status: Abnormal   Collection Time   08/29/11  1:43 PM      Component Value Range Comment   Opiates POSITIVE (*) NONE DETECTED     Cocaine POSITIVE (*) NONE DETECTED     Benzodiazepines POSITIVE (*) NONE DETECTED     Amphetamines POSITIVE (*) NONE DETECTED     Tetrahydrocannabinol NONE DETECTED  NONE DETECTED     Barbiturates NONE DETECTED  NONE DETECTED    TRICYCLICS SCREEN, URINE     Status: Normal   Collection Time   08/29/11  1:43 PM      Component Value Range Comment   TCA Scrn NONE DETECTED  NONE DETECTED    URINE MICROSCOPIC-ADD ON     Status: Abnormal   Collection Time   08/29/11  1:43 PM      Component Value Range Comment   Squamous Epithelial / LPF FEW (*) RARE     RBC / HPF 3-6  <3 (RBC/hpf)    Bacteria, UA FEW (*) RARE     Urine-Other MUCOUS PRESENT     GLUCOSE, CAPILLARY     Status: Normal   Collection Time   08/29/11  1:45 PM  Component Value Range Comment   Glucose-Capillary 95  70 - 99 (mg/dL)   GLUCOSE, CAPILLARY     Status: Normal   Collection Time   08/29/11  2:20 PM      Component Value Range Comment   Glucose-Capillary 85  70 - 99 (mg/dL)   GLUCOSE, CAPILLARY     Status: Normal   Collection Time   08/29/11  3:10 PM      Component Value Range Comment   Glucose-Capillary 84  70 - 99 (mg/dL)   POTASSIUM     Status: Abnormal   Collection Time   08/29/11  3:20 PM      Component Value Range Comment   Potassium 6.4 (*) 3.5 - 5.1 (mEq/L)   BLOOD GAS, ARTERIAL     Status: Abnormal   Collection Time   08/29/11   3:57 PM      Component Value Range Comment   FIO2 1.00      Delivery systems OXYGEN MASK      pH, Arterial 7.346 (*) 7.350 - 7.450     pCO2 arterial 44.1  35.0 - 45.0 (mmHg)    pO2, Arterial 57.0 (*) 80.0 - 100.0 (mmHg)    Bicarbonate 23.5  20.0 - 24.0 (mEq/L)    TCO2 20.6  0 - 100 (mmol/L)    Acid-base deficit 1.9  0.0 - 2.0 (mmol/L)    O2 Saturation 89.8      Patient temperature 98.6      Collection site LEFT BRACHIAL      Drawn by [phone]      Sample type ARTERIAL DRAW     GLUCOSE, CAPILLARY     Status: Abnormal   Collection Time   08/29/11  5:21 PM      Component Value Range Comment   Glucose-Capillary 62 (*) 70 - 99 (mg/dL)    Comment 1 Notify RN     GLUCOSE, CAPILLARY     Status: Normal   Collection Time   08/29/11  5:58 PM      Component Value Range Comment   Glucose-Capillary 88  70 - 99 (mg/dL)    Comment 1 Notify RN     GLUCOSE, CAPILLARY     Status: Normal   Collection Time   08/29/11  6:28 PM      Component Value Range Comment   Glucose-Capillary 97  70 - 99 (mg/dL)    Comment 1 Notify RN     BLOOD GAS, ARTERIAL     Status: Abnormal   Collection Time   08/29/11  7:49 PM      Component Value Range Comment   FIO2 1.00      Delivery systems BILEVEL POSITIVE AIRWAY PRESSURE      Inspiratory PAP 12      Expiratory PAP 5      pH, Arterial 7.433  7.350 - 7.450     pCO2 arterial 36.1  35.0 - 45.0 (mmHg)    pO2, Arterial 179.0 (*) 80.0 - 100.0 (mmHg)    Bicarbonate 23.7  20.0 - 24.0 (mEq/L)    TCO2 20.3  0 - 100 (mmol/L)    Acid-Base Excess 0.4  0.0 - 2.0 (mmol/L)    O2 Saturation 99.4      Patient temperature 98.6      Collection site LEFT RADIAL      Drawn by [phone]      Sample type ARTERIAL DRAW      Allens test (pass/fail) PASS  PASS  BASIC METABOLIC PANEL     Status: Abnormal   Collection Time   08/29/11  8:23 PM      Component Value Range Comment   Sodium 137  135 - 145 (mEq/L)    Potassium 4.7  3.5 - 5.1 (mEq/L)    Chloride 101  96 - 112 (mEq/L)      CO2 28  19 - 32 (mEq/L)    Glucose, Bld 131 (*) 70 - 99 (mg/dL)    BUN 16  6 - 23 (mg/dL)    Creatinine, Ser 1.61  0.50 - 1.35 (mg/dL)    Calcium 8.2 (*) 8.4 - 10.5 (mg/dL)    GFR calc non Af Amer 87 (*) >90 (mL/min)    GFR calc Af Amer >90  >90 (mL/min)   CBC     Status: Abnormal   Collection Time   08/30/11  5:25 AM      Component Value Range Comment   WBC 18.3 (*) 4.0 - 10.5 (K/uL)    RBC 5.41  4.22 - 5.81 (MIL/uL)    Hemoglobin 15.1  13.0 - 17.0 (g/dL)    HCT 09.6  04.5 - 40.9 (%)    MCV 82.3  78.0 - 100.0 (fL)    MCH 27.9  26.0 - 34.0 (pg)    MCHC 33.9  30.0 - 36.0 (g/dL)    RDW 81.1  91.4 - 78.2 (%)    Platelets 315  150 - 400 (K/uL)   BASIC METABOLIC PANEL     Status: Normal   Collection Time   08/30/11  5:25 AM      Component Value Range Comment   Sodium 135  135 - 145 (mEq/L)    Potassium 3.7  3.5 - 5.1 (mEq/L) DELTA CHECK NOTED   Chloride 101  96 - 112 (mEq/L)    CO2 27  19 - 32 (mEq/L)    Glucose, Bld 99  70 - 99 (mg/dL)    BUN 14  6 - 23 (mg/dL)    Creatinine, Ser 9.56  0.50 - 1.35 (mg/dL)    Calcium 8.4  8.4 - 10.5 (mg/dL)    GFR calc non Af Amer >90  >90 (mL/min)    GFR calc Af Amer >90  >90 (mL/min)   MAGNESIUM     Status: Normal   Collection Time   08/30/11  5:25 AM      Component Value Range Comment   Magnesium 1.7  1.5 - 2.5 (mg/dL)   PHOSPHORUS     Status: Normal   Collection Time   08/30/11  5:25 AM      Component Value Range Comment   Phosphorus 2.5  2.3 - 4.6 (mg/dL)   CK     Status: Abnormal   Collection Time   08/30/11  5:25 AM      Component Value [place] [address] (*) 7 - 232 (U/L)   MRSA PCR SCREENING     Status: Abnormal   Collection Time   08/30/11  9:34 AM      Component Value Range Comment   MRSA by PCR POSITIVE (*) NEGATIVE    PHOSPHORUS     Status: Abnormal   Collection Time   08/31/11  4:04 AM      Component Value Range Comment   Phosphorus 1.5 (*) 2.3 - 4.6 (mg/dL)   MAGNESIUM     Status: Normal   Collection Time    08/31/11  4:04 AM  Component Value Range Comment   Magnesium 2.0  1.5 - 2.5 (mg/dL)   BASIC METABOLIC PANEL     Status: Abnormal   Collection Time   08/31/11  4:04 AM      Component Value Range Comment   Sodium 139  135 - 145 (mEq/L)    Potassium 3.6  3.5 - 5.1 (mEq/L)    Chloride 105  96 - 112 (mEq/L)    CO2 27  19 - 32 (mEq/L)    Glucose, Bld 105 (*) 70 - 99 (mg/dL)    BUN 7  6 - 23 (mg/dL)    Creatinine, Ser 1.61  0.50 - 1.35 (mg/dL)    Calcium 8.8  8.4 - 10.5 (mg/dL)    GFR calc non Af Amer >90  >90 (mL/min)    GFR calc Af Amer >90  >90 (mL/min)   CBC     Status: Abnormal   Collection Time   08/31/11  4:04 AM      Component Value Range Comment   WBC 13.2 (*) 4.0 - 10.5 (K/uL)    RBC 5.32  4.22 - 5.81 (MIL/uL)    Hemoglobin 14.8  13.0 - 17.0 (g/dL)    HCT 09.6  04.5 - 40.9 (%)    MCV 82.0  78.0 - 100.0 (fL)    MCH 27.8  26.0 - 34.0 (pg)    MCHC 33.9  30.0 - 36.0 (g/dL)    RDW 81.1  91.4 - 78.2 (%)    Platelets 318  150 - 400 (K/uL)   DIFFERENTIAL     Status: Abnormal   Collection Time   08/31/11  4:04 AM      Component Value Range Comment   Neutrophils Relative 66  43 - 77 (%)    Neutro Abs 8.8 (*) 1.7 - 7.7 (K/uL)    Lymphocytes Relative 18  12 - 46 (%)    Lymphs Abs 2.4  0.7 - 4.0 (K/uL)    Monocytes Relative 15 (*) 3 - 12 (%)    Monocytes Absolute 1.9 (*) 0.1 - 1.0 (K/uL)    Eosinophils Relative 1  0 - 5 (%)    Eosinophils Absolute 0.1  0.0 - 0.7 (K/uL)    Basophils Relative 0  0 - 1 (%)    Basophils Absolute 0.0  0.0 - 0.1 (K/uL)   CK     Status: Abnormal   Collection Time   08/31/11  4:04 AM      Component Value Range Comment   Total CK 9206 (*) 7 - 232 (U/L)     Micro Results: Recent Results (from the past 240 hour(s))  MRSA PCR SCREENING     Status: Abnormal   Collection Time   08/30/11  9:34 AM      Component Value Range Status Comment   MRSA by PCR POSITIVE (*) NEGATIVE  Final     Studies/Results: Dg Chest Port 1 View  08/29/2011  *RADIOLOGY  REPORT*  Clinical Data: Overdose  PORTABLE CHEST - 1 VIEW  Comparison: Chest x-ray of 06/11/2011  Findings: There is abnormal opacity at the left lung base which may represent pneumonia, possibly due to aspiration.  The right lung is clear.  Heart size is stable.  No bony abnormality is seen.  IMPRESSION: Opacity at the left lung base.  Possible pneumonia as with aspiration.  Original Report Authenticated By: Juline Patch, M.D.   Medications: Scheduled Meds:    . azithromycin  500 mg Intravenous Q24H  .  Chlorhexidine Gluconate Cloth  6 each Topical Q0600  . clonazePAM  2 mg Oral BID  . cloNIDine  0.1 mg Oral QID   Followed by  . cloNIDine  0.1 mg Oral BH-qamhs   Followed by  . cloNIDine  0.1 mg Oral QAC breakfast  . FLUoxetine  20 mg Oral Daily  . heparin  5,000 Units Subcutaneous Q8H  . LORazepam  1 mg Intramuscular Once  . methadone  20 mg Oral Q8H  . mupirocin ointment  1 application Nasal BID  . nicotine  21 mg Transdermal Daily  . pantoprazole  40 mg Oral Q1200  . phosphorus  250 mg Oral BID  . DISCONTD: ampicillin-sulbactam (UNASYN) IV  1.5 g Intravenous Q6H  . DISCONTD: cloNIDine  0.1 mg Oral QID  . DISCONTD: cloNIDine  0.1 mg Oral BH-qamhs  . DISCONTD: cloNIDine  0.1 mg Oral QAC breakfast  . DISCONTD: cloNIDine HCl  0.2 mg Oral Q6H  . DISCONTD: LORazepam  2 mg Intravenous Once   Continuous Infusions:    . sodium chloride 125 mL/hr at 09/01/11 2245   PRN Meds:.dicyclomine, hydrOXYzine, loperamide, methocarbamol, naproxen, ondansetron, DISCONTD: zolpidem  Assessment/Plan: #1 Acute respiratory failure s/p bipap.Patient now tolerating atmospheric oxygen #2 Aspiration pneumonia-will d/c unasyn and start po zithromax since wbc count is trending up #3 Rhabdomyolysis-will continue hydration po and monitor cpk level #4 Polysubstance abuse #5 Hx of depression/Anxiety disorder-continue klonopin and prozac #6 Withdrawal Symptoms-will start methadone and taper down after two  days Await psychiatrist and social worker evaluation   LOS: 4 days   Ziyad Dyar 09/02/2011, 6:55 AM

## 2011-09-03 LAB — CBC
HCT: 44.5 % (ref 39.0–52.0)
Hemoglobin: 15.1 g/dL (ref 13.0–17.0)
MCHC: 33.9 g/dL (ref 30.0–36.0)
MCV: 81.4 fL (ref 78.0–100.0)
Platelets: 360 10*3/uL (ref 150–400)
RBC: 5.47 MIL/uL (ref 4.22–5.81)
WBC: 11.9 10*3/uL — ABNORMAL HIGH (ref 4.0–10.5)

## 2011-09-03 LAB — COMPREHENSIVE METABOLIC PANEL
ALT: 98 U/L — ABNORMAL HIGH (ref 0–53)
AST: 103 U/L — ABNORMAL HIGH (ref 0–37)
CO2: 30 mEq/L (ref 19–32)
Chloride: 100 mEq/L (ref 96–112)
GFR calc non Af Amer: >90 mL/min (ref >90)
Glucose, Bld: 80 mg/dL (ref 70–99)
Sodium: 139 mEq/L (ref 135–145)
Total Bilirubin: 0.5 mg/dL (ref 0.3–1.2)

## 2011-09-03 MED ORDER — HYDROXYZINE HCL 25 MG PO TABS
25.0000 mg | ORAL_TABLET | Freq: Four times a day (QID) | ORAL | Status: DC | PRN
  Filled 2011-09-03: qty 1

## 2011-09-03 MED ORDER — DIPHENHYDRAMINE HCL 25 MG PO CAPS
25.0000 mg | ORAL_CAPSULE | Freq: Once | ORAL | Status: DC
  Filled 2011-09-03: qty 1

## 2011-09-03 MED ORDER — HYDROXYZINE HCL 25 MG PO TABS
25.0000 mg | ORAL_TABLET | Freq: Four times a day (QID) | ORAL | Status: DC | PRN
  Administered 2011-09-03: 25 mg via ORAL
  Filled 2011-09-03: qty 1

## 2011-09-03 NOTE — Progress Notes (Signed)
  [name] is a 20 y.o. male [id number] 06/08/1991  Subjective/Objective:  Patient today is drowsy, was having difficulty in keeping eyes open. He is anxious, having nausea diarrhea and runny nose. Patient wants to go to inpatient rehabilitation I told him that social worker is going to work with him on that. Patient is logical and goal-directed is not suicidal or homicidal he is not hallucinating or delusional. Social Hx:  Pt has extensive hx of heroin use. Pt states that he no longer uses daily, however when he does use, it's in large amounts. Pt currently smokes and has for many years. Pt denies significant ETOH use. Pt currently homeless, although he may be able to stay with grandmom or mom. Pt has never been married. Poor support system. Strained relationships with mom and father, as they are "addicts", per Pt. Pt a resident of [address], by hx, but owes back rent in the amt of $580. Was employed in a [place] but feels that he's probably fired due to this hospitalization. Has many skills; feels he's very employable.  Recent Loss:  Pt recently gave his 79-year-old son up for adoption, although this was not done through the court system. Son now lives in [place].    Filed Vitals:   09/03/11 0635  BP: 101/57  Pulse: 61  Temp: 97.6 F (36.4 C)  Resp: 16    Lab Results:   BMET    Component Value Date/Time   NA 139 09/03/2011 0330   K 4.3 09/03/2011 0330   CL 100 09/03/2011 0330   CO2 30 09/03/2011 0330   GLUCOSE 80 09/03/2011 0330   BUN 10 09/03/2011 0330   CREATININE 0.95 09/03/2011 0330   CALCIUM 9.5 09/03/2011 0330   GFRNONAA >90 09/03/2011 0330   GFRAA >90 09/03/2011 0330    Medications:  Scheduled:     . Chlorhexidine Gluconate Cloth  6 each Topical Q0600  . clonazePAM  2 mg Oral BID  . FLUoxetine  20 mg Oral Daily  . methadone  20 mg Oral Q8H  . mupirocin ointment  1 application Nasal BID  . nicotine  21 mg Transdermal Daily  . pantoprazole  40 mg Oral Q1200    . phosphorus  250 mg Oral BID  . DISCONTD: azithromycin  250 mg Oral Daily  . DISCONTD: heparin  5,000 Units Subcutaneous Q8H     PRN Meds dicyclomine, hydrOXYzine, loperamide, methocarbamol, naproxen, ondansetron  Assessment/Plan:  I will  recommend to increase the methadone to 40 mg by mouth daily. I will leave that decision to the medical team because of his respiratory complications.  AHLUWALIA,SHAMSHER S MD 09/03/2011

## 2011-09-03 NOTE — Progress Notes (Signed)
Pt requesting medication for itching. MD on call notified and orders received. Will continue to monitor.

## 2011-09-03 NOTE — Progress Notes (Signed)
Pt requesting medication for sleep.  MD contacted and new orders received.  Will continue to monitor.  Daphene Calamity [facility] 09/03/2011

## 2011-09-03 NOTE — Progress Notes (Signed)
PATIENT DETAILS Name: [name] Age: 20 y.o. Sex: male Date of Birth: [birth year] Admit Date: 08/29/2011 PCP:No primary provider on file.  CONSULTS: 1. Eulogio Ditch, psychiatry    Interval History: Mr. Rathman is a homeless male who was admitted on 08/29/2011 by the critical care team, with psychosis and polysubstance abuse, found down in a hotel room, hypothermic and hypotensive on initial presentation. He was also found to be in acute respiratory failure and needed BiPAP. He is also being treated for a left-sided aspiration pneumonia. He was transferred to the care of the hospitalist service on 08/31/2011. His hospital course has been complicated by the development of withdrawal type symptoms. Patient has been seen by psychiatry and the recommendation is for him to go to inpatient rehabilitation once medically stable. ROS: Mr. Shaddock says that he is experiencing some mild withdrawal symptoms. He is having a little bit of nausea and some diarrhea as well as a runny nose, but reports that overall his symptoms have been well-controlled since starting on methadone. He continues to express a desire to get into an inpatient rehabilitation program.   Objective: Vital signs in last 24 hours: Temp:  [97.6 F (36.4 C)-98 F (36.7 C)] 97.6 F (36.4 C) (12/05 0635) Pulse Rate:  [61-128] 61  (12/05 0635) Resp:  [16-19] 16  (12/05 0635) BP: (99-108)/(54-59) 101/57 mmHg (12/05 0635) SpO2:  [95 %-96 %] 95 % (12/05 0635) Weight:  [64.862 kg (142 lb 15.9 oz)] 142 lb 15.9 oz (64.862 kg) (12/05 0635) Weight change: -0.439 kg (-15.5 oz) Last BM Date: 09/02/11  Intake/Output from previous day:  Intake/Output Summary (Last 24 hours) at 09/03/11 0850 Last data filed at 09/02/11 2300  Gross per 24 hour  Intake    720 ml  Output      0 ml  Net    720 ml     Physical Exam:  Gen:  No acute distress. Cardiovascular:  Regular rate, and rhythm. No murmurs, rubs, or gallops Respiratory: Lungs  clear to auscultation bilaterally with good air movement. Gastrointestinal: Abdomen soft, nontender, nondistended with normal active bowel sounds. Extremities: No clubbing, edema, or cyanosis.   Lab Results: Basic Metabolic Panel:  Lab 09/03/11 1610 09/02/11 0405 09/01/11 0345 08/31/11 0404 08/30/11 0525  NA 139 140 140 139 135  K 4.3 3.7 -- -- --  CL 100 104 107 105 101  CO2 30 27 23 27 27   GLUCOSE 80 105* 100* 105* 99  BUN 10 7 6 7 14   CREATININE 0.95 0.85 0.72 0.80 0.99  CALCIUM 9.5 9.3 9.1 8.8 8.4  MG -- -- -- 2.0 1.7  PHOS -- -- -- 1.5* 2.5   GFR Estimated Creatinine Clearance: 112.9 ml/min (by C-G formula based on Cr of 0.95). Liver Function Tests:  Lab 09/03/11 0330 09/02/11 0405 09/01/11 0345 08/29/11 1320  AST 103* 165* 218* 40*  ALT 98* 104* 99* 38  ALKPHOS 33* 34* 31* 53  BILITOT 0.5 0.3 0.3 0.3  PROT 6.5 6.3 5.9* 8.2  ALBUMIN 3.0* 2.9* 2.5* 4.2    CBC:  Lab 09/03/11 0330 09/02/11 0405 09/01/11 0345 08/31/11 0404 08/30/11 0525 08/29/11 1320  WBC 11.9* 15.6* 13.1* 13.2* 18.3* --  NEUTROABS -- -- 8.6* 8.8* -- 14.2*  HGB 15.1 14.8 14.3 14.8 15.1 --  HCT 44.5 43.5 41.7 43.6 44.5 --  MCV 81.4 81.3 80.3 82.0 82.3 --  PLT 360 357 331 318 315 --   Cardiac Enzymes:  Lab 09/03/11 0330 09/02/11 0405 09/01/11 0345  08/31/11 0404 08/30/11 0525  CKTOTAL 2338* 4872* 7320* 9206* 18775*  CKMB -- -- -- -- --  CKMBINDEX -- -- -- -- --  TROPONINI -- -- -- -- --  CBG:  Lab 08/29/11 1828 08/29/11 1758 08/29/11 1721 08/29/11 1510 08/29/11 1420  GLUCAP 97 88 62* 84 85     Recent Results (from the past 240 hour(s))  MRSA PCR SCREENING     Status: Abnormal   Collection Time   08/30/11  9:34 AM      Component Value Range Status Comment   MRSA by PCR POSITIVE (*) NEGATIVE  Final     Studies/Results: Dg Chest 2 View  09/02/2011  *RADIOLOGY REPORT*  Clinical Data: Acute respiratory failure secondary to heroin overdose  CHEST - 2 VIEW  Comparison: 08/29/2011  Findings:  Heart size appears normal.  No pleural effusion or interstitial edema identified.  Streaky opacities within the left lung appear improved from previous exam.  Right lung is clear.  IMPRESSION:  1.  Improvement in aeration to the left lung.  No new findings.  Original Report Authenticated By: Rosealee Albee, M.D.    Medications: Scheduled Meds:   . azithromycin  250 mg Oral Daily  . Chlorhexidine Gluconate Cloth  6 each Topical Q0600  . clonazePAM  2 mg Oral BID  . FLUoxetine  20 mg Oral Daily  . heparin  5,000 Units Subcutaneous Q8H  . methadone  20 mg Oral Q8H  . mupirocin ointment  1 application Nasal BID  . nicotine  21 mg Transdermal Daily  . pantoprazole  40 mg Oral Q1200  . phosphorus  250 mg Oral BID  . DISCONTD: cloNIDine  0.1 mg Oral QID  . DISCONTD: cloNIDine  0.1 mg Oral BH-qamhs  . DISCONTD: cloNIDine  0.1 mg Oral QAC breakfast   Continuous Infusions:   . sodium chloride 125 mL/hr at 09/01/11 2245   PRN Meds:.dicyclomine, hydrOXYzine, loperamide, methocarbamol, naproxen, ondansetron Antibiotics: Anti-infectives     Start     Dose/Rate Route Frequency Ordered Stop   09/02/11 1000   azithromycin (ZITHROMAX) tablet 250 mg        250 mg Oral Daily 09/02/11 0702     09/02/11 0700   azithromycin (ZITHROMAX) 500 mg in dextrose 5 % 250 mL IVPB  Status:  Discontinued        500 mg 250 mL/hr over 60 Minutes Intravenous Every 24 hours 09/02/11 0654 09/02/11 0702   08/29/11 2000   ampicillin-sulbactam (UNASYN) 1.5 g in sodium chloride 0.9 % 50 mL IVPB  Status:  Discontinued        1.5 g 100 mL/hr over 30 Minutes Intravenous Every 6 hours 08/29/11 1806 09/02/11 0654           Assessment/Plan:  Principal Problem:  *Acute respiratory failure Assessment: Secondary to heroin overdose. Resolved. Plan: Continue to support the patient's efforts to quit heroin use. Active Problems:  Aspiration pneumonia Assessment: Asymptomatic. Plan: Discontinue antibiotics.   Polysubstance dependence including opioid type drug, episodic abuse Assessment: Heroin dependency. Plan: Social work consultation for inpatient rehabilitation, homeless shelters, and outpatient ADS services. Continue methadone.  Rhabdomyolysis Assessment: Secondary to heroin use. Plan: CK levels dropping steadily. Continue IV fluids.  Elevated LFTs Assessment: Likely reactive. Plan: Transaminase is beginning to decline. Periodically monitor.  Tobacco abuse Assessment: Chronic. Plan: Cessation counseling. Continue nicotine patch.    LOS: 5 days   Hillery Aldo, MD Pager [phone]  09/03/2011, 8:50 AM

## 2011-09-03 NOTE — Progress Notes (Signed)
Per Pt request, do not share information with his mom.  Spoke with Pt regarding inpt rehab options.  Pt not wanting to go to [facility].  Wanting to explore other options.  Provided Pt with information regarding the [facility] program through [facility].  Pt interested in this program.  Left message for admissions counselor.  Pt to begin the application process.  CSW will assist with application tomorrow.  CSW and Pt to speak with [facility] program admissions counselor together tomorrow.  CSW to continue to follow.  Providence Crosby, LCSWA Clinical Social Work [phone]

## 2011-09-04 MED ORDER — DIPHENHYDRAMINE HCL 25 MG PO CAPS
25.0000 mg | ORAL_CAPSULE | ORAL | Status: DC | PRN
  Administered 2011-09-04: 25 mg via ORAL
  Filled 2011-09-04: qty 1

## 2011-09-04 NOTE — Progress Notes (Signed)
  [name] is a 20 y.o. male [id number] 03-Dec-1990  Subjective/Objective:  Patient is doing better but is still kind of sedated have difficulty in talking with me and keeping his eyes open. He told me that he likes the [facility] program at [facility] and is interested in that. Psychoeducation given to the patient regarding methadone. Patient is logical and goal-directed is not hallucinating or delusional is not suicidal or homicidal insight and judgment fair.  Filed Vitals:   09/04/11 0625  BP: 102/61  Pulse: 100  Temp: 98.2 F (36.8 C)  Resp: 16    Lab Results:   BMET    Component Value Date/Time   NA 139 09/03/2011 0330   K 4.3 09/03/2011 0330   CL 100 09/03/2011 0330   CO2 30 09/03/2011 0330   GLUCOSE 80 09/03/2011 0330   BUN 10 09/03/2011 0330   CREATININE 0.95 09/03/2011 0330   CALCIUM 9.5 09/03/2011 0330   GFRNONAA >90 09/03/2011 0330   GFRAA >90 09/03/2011 0330    Medications:  Scheduled:     . Chlorhexidine Gluconate Cloth  6 each Topical Q0600  . clonazePAM  2 mg Oral BID  . diphenhydrAMINE  25 mg Oral Once  . FLUoxetine  20 mg Oral Daily  . methadone  20 mg Oral Q8H  . mupirocin ointment  1 application Nasal BID  . nicotine  21 mg Transdermal Daily  . pantoprazole  40 mg Oral Q1200  . phosphorus  250 mg Oral BID     PRN Meds dicyclomine, hydrOXYzine, loperamide, methocarbamol, naproxen, ondansetron, DISCONTD: hydrOXYzine, DISCONTD: hydrOXYzine  Assessment/Plan: Patient can be continued on the current treatment plan.  Sherl Yzaguirre S MD 09/04/2011

## 2011-09-04 NOTE — Progress Notes (Signed)
Spoke with Benard Rink from [facility] program.  Mrs. Daphine Deutscher had questions regarding Pt's Metadone plan.  She explained that upon admission to their program, Pt would meet with an RN to determine if Pt is appropriate for Methadone.  If so, there's a 1-2 week waiting period to get into their Methadone program.  Thus, she is wondering if the MD would be willing to d/c Pt with enough to get him through the waiting period.  If so, she emphasized that they will not be administering the Methadone, rather they will keep it locked for him to access at appropriate times.  They will count it but they will not monitor it.    Thanked Mrs. Daphine Deutscher for her call and raising these concerns.  CSW to f/u.  Providence Crosby, LCSWA Clinical Social Work [phone]

## 2011-09-04 NOTE — Progress Notes (Signed)
Pt is lethargic.  He awakens to being shaken and to his name being called, slurs his words, falls asleep quickly and while sleeping his respirations are 8-10 per minute. [name] [facility] 09/04/2011 5:32 AM

## 2011-09-04 NOTE — Progress Notes (Signed)
PATIENT DETAILS Name: [name] Age: 20 y.o. Sex: male Date of Birth: June 21, 1991 Admit Date: 08/29/2011 PCP:No primary provider on file.  CONSULTS: 1. Dr. Eulogio Ditch, psychiatry  Interval History: [name] is a homeless male who was admitted on 08/29/2011 by the critical care team, with psychosis and polysubstance abuse, found down in a hotel room, hypothermic and hypotensive on initial presentation. He was also found to be in acute respiratory failure and needed BiPAP. He was also being treated for a left-sided aspiration pneumonia. He was transferred to the care of the hospitalist service on 08/31/2011. His hospital course has been complicated by the development of withdrawal type symptoms. Patient has been seen by psychiatry and the recommendation is for him to go to inpatient rehabilitation once medically stable.  He has been started on methadone maintenance.    ROS: [name] is sedated.  He denies being overly sedated.  He states he found a program, but has concerns about being in a methadone maintenance program if he has to go there daily to receive his medication.     Objective: Vital signs in last 24 hours: Temp:  [97.6 F (36.4 C)-98.2 F (36.8 C)] 98.1 F (36.7 C) (12/06 1425) Pulse Rate:  [74-100] 74  (12/06 1425) Resp:  [8-18] 18  (12/06 1425) BP: (93-115)/(53-63) 93/53 mmHg (12/06 1425) SpO2:  [93 %-97 %] 96 % (12/06 1425) Weight change:  Last BM Date: 09/03/11  Intake/Output from previous day:  Intake/Output Summary (Last 24 hours) at 09/04/11 1645 Last data filed at 09/03/11 1900  Gross per 24 hour  Intake    240 ml  Output      0 ml  Net    240 ml     Physical Exam:  Gen: No acute distress.  Cardiovascular: Regular rate, and rhythm. No murmurs, rubs, or gallops  Respiratory: Lungs clear to auscultation bilaterally with good air movement.  Gastrointestinal: Abdomen soft, nontender, nondistended with normal active bowel sounds.  Extremities:  No clubbing, edema, or cyanosis.   Lab Results: Basic Metabolic Panel:  Lab 09/03/11 1610 09/02/11 0405 09/01/11 0345 08/31/11 0404 08/30/11 0525  NA 139 140 140 139 135  K 4.3 3.7 -- -- --  CL 100 104 107 105 101  CO2 30 27 23 27 27   GLUCOSE 80 105* 100* 105* 99  BUN 10 7 6 7 14   CREATININE 0.95 0.85 0.72 0.80 0.99  CALCIUM 9.5 9.3 9.1 8.8 8.4  MG -- -- -- 2.0 1.7  PHOS -- -- -- 1.5* 2.5   GFR Estimated Creatinine Clearance: 112.9 ml/min (by C-G formula based on Cr of 0.95). Liver Function Tests:  Lab 09/03/11 0330 09/02/11 0405 09/01/11 0345 08/29/11 1320  AST 103* 165* 218* 40*  ALT 98* 104* 99* 38  ALKPHOS 33* 34* 31* 53  BILITOT 0.5 0.3 0.3 0.3  PROT 6.5 6.3 5.9* 8.2  ALBUMIN 3.0* 2.9* 2.5* 4.2    CBC:  Lab 09/03/11 0330 09/02/11 0405 09/01/11 0345 08/31/11 0404 08/30/11 0525 08/29/11 1320  WBC 11.9* 15.6* 13.1* 13.2* 18.3* --  NEUTROABS -- -- 8.6* 8.8* -- 14.2*  HGB 15.1 14.8 14.3 14.8 15.1 --  HCT 44.5 43.5 41.7 43.6 44.5 --  MCV 81.4 81.3 80.3 82.0 82.3 --  PLT 360 357 331 318 315 --   Cardiac Enzymes:  Lab 09/03/11 0330 09/02/11 0405 09/01/11 0345 08/31/11 0404 08/30/11 0525  CKTOTAL 2338* 4872* 7320* 9206* 18775*  CKMB -- -- -- -- --  CKMBINDEX -- -- -- -- --  TROPONINI -- -- -- -- --   CBG:  Lab 08/29/11 1828 08/29/11 1758 08/29/11 1721 08/29/11 1510 08/29/11 1420  GLUCAP 97 88 62* 84 85     Recent Results (from the past 240 hour(s))  MRSA PCR SCREENING     Status: Abnormal   Collection Time   08/30/11  9:34 AM      Component Value Range Status Comment   MRSA by PCR POSITIVE (*) NEGATIVE  Final     Studies/Results: No results found.  Medications: Scheduled Meds:   . Chlorhexidine Gluconate Cloth  6 each Topical Q0600  . clonazePAM  2 mg Oral BID  . diphenhydrAMINE  25 mg Oral Once  . FLUoxetine  20 mg Oral Daily  . methadone  20 mg Oral Q8H  . mupirocin ointment  1 application Nasal BID  . nicotine  21 mg Transdermal Daily  .  pantoprazole  40 mg Oral Q1200  . phosphorus  250 mg Oral BID   Continuous Infusions:   . sodium chloride 125 mL/hr at 09/01/11 2245   PRN Meds:.dicyclomine, diphenhydrAMINE, hydrOXYzine, loperamide, methocarbamol, naproxen, ondansetron, DISCONTD: hydrOXYzine, DISCONTD: hydrOXYzine Antibiotics: Anti-infectives     Start     Dose/Rate Route Frequency Ordered Stop   09/02/11 1000   azithromycin (ZITHROMAX) tablet 250 mg  Status:  Discontinued        250 mg Oral Daily 09/02/11 0702 09/03/11 0909   09/02/11 0700   azithromycin (ZITHROMAX) 500 mg in dextrose 5 % 250 mL IVPB  Status:  Discontinued        500 mg 250 mL/hr over 60 Minutes Intravenous Every 24 hours 09/02/11 0654 09/02/11 0702   08/29/11 2000   ampicillin-sulbactam (UNASYN) 1.5 g in sodium chloride 0.9 % 50 mL IVPB  Status:  Discontinued        1.5 g 100 mL/hr over 30 Minutes Intravenous Every 6 hours 08/29/11 1806 09/02/11 0654           Assessment/Plan:  Principal Problem:  *Acute respiratory failure  Assessment: Secondary to heroin overdose. Resolved.  Plan: Continue to support the patient's efforts to quit heroin use.  Active Problems:  Aspiration pneumonia  Assessment: Asymptomatic.  Plan: Discontinue antibiotics.  Polysubstance dependence including opioid type drug, episodic abuse  Assessment: Heroin dependency.  Plan: Social work consultation for inpatient rehabilitation, homeless shelters, and outpatient ADS services. Continue methadone.  Rhabdomyolysis  Assessment: Secondary to heroin use.  Plan: CK levels dropping steadily. OK to d/c IVF Elevated LFTs  Assessment: Likely reactive.  Plan: Transaminase is beginning to decline. Periodically monitor.  Tobacco abuse  Assessment: Chronic.  Plan: Cessation counseling. Continue nicotine patch.   LOS: 6 days   Hillery Aldo, MD Pager [phone]  09/04/2011, 4:45 PM

## 2011-09-04 NOTE — Progress Notes (Signed)
Spoke with [facility] program admission counselor.  Provided her with preliminary information. Met with Pt and support person, Misty Stanley.  Misty Stanley expressed concerns regarding BATS program, as she felt that Pt would have too much down-time and that there may not be access to adequate nutrition.  Pt has food stamps and stated that he felt the amount was sufficient for one person.  While in Pt's room, [facility] counselor and Pt spoke via phone.  Pt asked to speak with counselor privately.  CSW to f/u.  Providence Crosby, LCSWA Clinical Social Work [phone]

## 2011-09-05 LAB — RAPID URINE DRUG SCREEN, HOSP PERFORMED
Barbiturates: NOT DETECTED
Tetrahydrocannabinol: NOT DETECTED

## 2011-09-05 LAB — BLOOD GAS, ARTERIAL
O2 Content: 2 L/min
Patient temperature: 98.6
pH, Arterial: 7.412 (ref 7.350–7.450)

## 2011-09-05 LAB — COMPREHENSIVE METABOLIC PANEL
AST: 47 U/L — ABNORMAL HIGH (ref 0–37)
Albumin: 3.3 g/dL — ABNORMAL LOW (ref 3.5–5.2)
Alkaline Phosphatase: 35 U/L — ABNORMAL LOW (ref 39–117)
Chloride: 98 mEq/L (ref 96–112)
Potassium: 4 mEq/L (ref 3.5–5.1)
Sodium: 138 mEq/L (ref 135–145)
Total Bilirubin: 0.2 mg/dL — ABNORMAL LOW (ref 0.3–1.2)

## 2011-09-05 LAB — CK: Total CK: 534 U/L — ABNORMAL HIGH (ref 7–232)

## 2011-09-05 MED ORDER — DICYCLOMINE HCL 20 MG PO TABS
20.0000 mg | ORAL_TABLET | ORAL | Status: DC | PRN
  Filled 2011-09-05: qty 1

## 2011-09-05 MED ORDER — HALOPERIDOL LACTATE 5 MG/ML IJ SOLN
5.0000 mg | INTRAMUSCULAR | Status: DC | PRN
  Administered 2011-09-08: 5 mg via INTRAMUSCULAR
  Filled 2011-09-05: qty 1

## 2011-09-05 MED ORDER — SODIUM CHLORIDE 0.9 % IV SOLN
INTRAVENOUS | Status: DC
  Administered 2011-09-05: 08:00:00 via INTRAVENOUS

## 2011-09-05 MED ORDER — NAPROXEN 500 MG PO TABS
500.0000 mg | ORAL_TABLET | Freq: Two times a day (BID) | ORAL | Status: DC | PRN
  Filled 2011-09-05: qty 1

## 2011-09-05 MED ORDER — NALOXONE HCL 0.4 MG/ML IJ SOLN
INTRAMUSCULAR | Status: AC
  Administered 2011-09-05: 0.4 mg via INTRAVENOUS
  Filled 2011-09-05: qty 1

## 2011-09-05 MED ORDER — NALOXONE HCL 0.4 MG/ML IJ SOLN
INTRAMUSCULAR | Status: AC
  Administered 2011-09-05: 0.4 mg
  Filled 2011-09-05: qty 1

## 2011-09-05 MED ORDER — LOPERAMIDE HCL 2 MG PO CAPS: 2.0000 mg | ORAL_CAPSULE | ORAL | Status: DC | PRN

## 2011-09-05 MED ORDER — ONDANSETRON 4 MG PO TBDP
4.0000 mg | ORAL_TABLET | Freq: Four times a day (QID) | ORAL | Status: DC | PRN
  Filled 2011-09-05: qty 1

## 2011-09-05 MED ORDER — LORAZEPAM 2 MG/ML IJ SOLN: 1.0000 mg | INTRAMUSCULAR | Status: DC | PRN

## 2011-09-05 MED ORDER — METHOCARBAMOL 500 MG PO TABS: 500.0000 mg | ORAL_TABLET | Freq: Three times a day (TID) | ORAL | Status: DC | PRN

## 2011-09-05 MED ORDER — HYDROXYZINE HCL 25 MG PO TABS
25.0000 mg | ORAL_TABLET | Freq: Four times a day (QID) | ORAL | Status: DC | PRN
  Filled 2011-09-05: qty 1

## 2011-09-05 MED ORDER — CLONIDINE HCL 0.1 MG PO TABS
0.1000 mg | ORAL_TABLET | Freq: Four times a day (QID) | ORAL | Status: AC
  Administered 2011-09-05 – 2011-09-07 (×9): 0.1 mg via ORAL
  Filled 2011-09-05 (×11): qty 1

## 2011-09-05 MED ORDER — CLONIDINE HCL 0.1 MG PO TABS
0.1000 mg | ORAL_TABLET | Freq: Every day | ORAL | Status: DC
  Filled 2011-09-05: qty 1

## 2011-09-05 MED ORDER — NALOXONE HCL 0.4 MG/ML IJ SOLN
0.4000 mg | INTRAMUSCULAR | Status: DC | PRN
Start: 2011-09-05 — End: 2011-09-10
  Administered 2011-09-05: 0.4 mg via INTRAVENOUS

## 2011-09-05 MED ORDER — LORAZEPAM 2 MG/ML IJ SOLN
1.0000 mg | INTRAMUSCULAR | Status: DC | PRN
  Administered 2011-09-05 – 2011-09-10 (×16): 1 mg via INTRAVENOUS
  Filled 2011-09-05 (×17): qty 1

## 2011-09-05 MED ORDER — CLONIDINE HCL 0.1 MG PO TABS
0.1000 mg | ORAL_TABLET | ORAL | Status: AC
  Administered 2011-09-08 – 2011-09-09 (×2): 0.1 mg via ORAL
  Filled 2011-09-05 (×4): qty 1

## 2011-09-05 MED ORDER — ZOLPIDEM TARTRATE 5 MG PO TABS
5.0000 mg | ORAL_TABLET | Freq: Every evening | ORAL | Status: DC | PRN
  Administered 2011-09-05 – 2011-09-06 (×2): 5 mg via ORAL
  Filled 2011-09-05 (×2): qty 1

## 2011-09-05 NOTE — Progress Notes (Signed)
PATIENT DETAILS Name: [name] Age: 20 y.o. Sex: male Date of Birth: 1990/12/14 Admit Date: 08/29/2011 PCP:No primary provider on file.  CONSULTS: 1. Dr. Eulogio Ditch, psychiatry  Interval History: [name] is a homeless male who was admitted on 08/29/2011 by the critical care team, with psychosis and polysubstance abuse, found down in a hotel room, hypothermic and hypotensive on initial presentation. He was also found to be in acute respiratory failure and needed BiPAP. He was also being treated for a left-sided aspiration pneumonia. He was transferred to the care of the hospitalist service on 08/31/2011. His hospital course has been complicated by the development of withdrawal type symptoms. Patient has been seen by psychiatry and the recommendation is for him to go to inpatient rehabilitation once medically stable. He has been started on methadone maintenance.  Early in the morning on 09/05/11, rapid response was called due to the patient becoming unresponsive and hypoxic.  He responded to narcan.  He, apparently, had friends visit the evening before.  He has been transferred to the SDU for further monitoring.  ROS: Adamantly denies being given illicit substances by friends last Montoya.  Has mild dyspnea, no current N/V or pain.   Objective: Vital signs in last 24 hours: Temp:  [98.1 F (36.7 C)-98.6 F (37 C)] 98.6 F (37 C) (12/07 0620) Pulse Rate:  [74-118] 118  (12/07 0620) Resp:  [18-22] 22  (12/07 0620) BP: (93-101)/(53-65) 101/59 mmHg (12/07 0620) SpO2:  [92 %-96 %] 95 % (12/07 0620) Weight change:  Last BM Date: 09/03/11  Intake/Output from previous day:  Intake/Output Summary (Last 24 hours) at 09/05/11 0711 Last data filed at 09/05/11 0500  Gross per 24 hour  Intake    240 ml  Output    850 ml  Net   -610 ml     Physical Exam:  Gen:  Awake and alert at present Cardiovascular: Tachycardic, no M/R/G Respiratory: Lungs clear to auscultation bilaterally  with good air movement.  Gastrointestinal: Abdomen soft, nontender, nondistended with normal active bowel sounds.  Extremities: No clubbing, edema, or cyanosis.   Lab Results: Basic Metabolic Panel:  Lab 09/03/11 6295 09/02/11 0405 09/01/11 0345 08/31/11 0404 08/30/11 0525  NA 139 140 140 139 135  K 4.3 3.7 -- -- --  CL 100 104 107 105 101  CO2 30 27 23 27 27   GLUCOSE 80 105* 100* 105* 99  BUN 10 7 6 7 14   CREATININE 0.95 0.85 0.72 0.80 0.99  CALCIUM 9.5 9.3 9.1 8.8 8.4  MG -- -- -- 2.0 1.7  PHOS -- -- -- 1.5* 2.5   GFR Estimated Creatinine Clearance: 112.9 ml/min (by C-G formula based on Cr of 0.95). Liver Function Tests:  Lab 09/03/11 0330 09/02/11 0405 09/01/11 0345 08/29/11 1320  AST 103* 165* 218* 40*  ALT 98* 104* 99* 38  ALKPHOS 33* 34* 31* 53  BILITOT 0.5 0.3 0.3 0.3  PROT 6.5 6.3 5.9* 8.2  ALBUMIN 3.0* 2.9* 2.5* 4.2    CBC:  Lab 09/03/11 0330 09/02/11 0405 09/01/11 0345 08/31/11 0404 08/30/11 0525 08/29/11 1320  WBC 11.9* 15.6* 13.1* 13.2* 18.3* --  NEUTROABS -- -- 8.6* 8.8* -- 14.2*  HGB 15.1 14.8 14.3 14.8 15.1 --  HCT 44.5 43.5 41.7 43.6 44.5 --  MCV 81.4 81.3 80.3 82.0 82.3 --  PLT 360 357 331 318 315 --   Cardiac Enzymes:  Lab 09/03/11 0330 09/02/11 0405 09/01/11 0345 08/31/11 0404 08/30/11 0525  CKTOTAL 2841* 4872* 7320*  9206* [id number]*  CKMB -- -- -- -- --  CKMBINDEX -- -- -- -- --  TROPONINI -- -- -- -- --   CBG:  Lab 08/29/11 1828 08/29/11 1758 08/29/11 1721 08/29/11 1510 08/29/11 1420  GLUCAP 97 88 62* 84 85     Recent Results (from the past 240 hour(s))  MRSA PCR SCREENING     Status: Abnormal   Collection Time   08/30/11  9:34 AM      Component Value Range Status Comment   MRSA by PCR POSITIVE (*) NEGATIVE  Final     Studies/Results: No results found.  Medications: Scheduled Meds:   . Chlorhexidine Gluconate Cloth  6 each Topical Q0600  . clonazePAM  2 mg Oral BID  . diphenhydrAMINE  25 mg Oral Once  . FLUoxetine  20 mg Oral  Daily  . methadone  20 mg Oral Q8H  . mupirocin ointment  1 application Nasal BID  . naloxone      . nicotine  21 mg Transdermal Daily  . pantoprazole  40 mg Oral Q1200  . phosphorus  250 mg Oral BID   Continuous Infusions:   . DISCONTD: sodium chloride 125 mL/hr at 09/01/11 2245   PRN Meds:.dicyclomine, diphenhydrAMINE, hydrOXYzine, loperamide, methocarbamol, naloxone (NARCAN) injection, naproxen, ondansetron Antibiotics: Anti-infectives     Start     Dose/Rate Route Frequency Ordered Stop   09/02/11 1000   azithromycin (ZITHROMAX) tablet 250 mg  Status:  Discontinued        250 mg Oral Daily 09/02/11 0702 09/03/11 0909   09/02/11 0700   azithromycin (ZITHROMAX) 500 mg in dextrose 5 % 250 mL IVPB  Status:  Discontinued        500 mg 250 mL/hr over 60 Minutes Intravenous Every 24 hours 09/02/11 0654 09/02/11 0702   08/29/11 2000   ampicillin-sulbactam (UNASYN) 1.5 g in sodium chloride 0.9 % 50 mL IVPB  Status:  Discontinued        1.5 g 100 mL/hr over 30 Minutes Intravenous Every 6 hours 08/29/11 1806 09/02/11 0654           Assessment/Plan:  Principal Problem:  *Acute respiratory failure  Assessment: Secondary to heroin overdose. Resolved initially, now with reoccurance over Montoya, with suspected drug ingestion.  Plan: Transfer patient to the SDU for closer monitoring.  Obtain ABG.  Obtain serum drug screen.  Narcan PRN.  Hold all sedating medications.  Continue to support the patient's efforts to quit heroin use.  Active Problems:  Aspiration pneumonia  Assessment: Asymptomatic.  Plan: Discontinued antibiotics.  Polysubstance dependence including opioid type drug, episodic abuse  Assessment: Heroin dependency.  Plan: Social work consultation for inpatient rehabilitation, homeless shelters, and outpatient ADS services.  Rhabdomyolysis  Assessment: Secondary to heroin use.  Plan: CK levels dropping steadily. Re-check given possible drug use last Montoya. Elevated LFTs    Assessment: Likely reactive.  Plan: Transaminase is beginning to decline. Periodically monitor.  Tobacco abuse  Assessment: Chronic.  Plan: Cessation counseling. Continue nicotine patch.   LOS: 7 days   Hillery Aldo, MD Pager [phone]  09/05/2011, 7:11 AM

## 2011-09-05 NOTE — Progress Notes (Signed)
Rapid Response Event Note  Overview:  Rapid Response called to room 1308 at 0540, while en route, Code Blue called overhead in hospital at 0542. Upon arrival to room, [facility] and ED Doctor at bedside, pt with eyes open, trembling, no code in progress. Pt placed on code cart monitor heart rate ST 130s, Bp 129/87, 02 sat 100% on NRB. Per [address] MD Pt admitted for heroin overdose had visit from friends earlier tonight at 2030.  Initial Focused Assessment: Pt minimally  responsive to sternal rub, not following commands. Small pupils sluggish to react  Interventions: Placed on monitor, IV team RN at bedside obtained IV access, Narcan given 0.8m IV at 0550.   Event Summary:   at   27 Narcan given 0.4mg  IVP    at  0555 pt wide awake stating "What happened Im so cold" ED physician Dr Nicanor Alcon at bedside, spoke with Dr Izola Price the pt attending MD via phone who is en route to room.    0605 ST 126, 140/85 bp, RR 16, po2 93% 2 lnc lethargic but following commands. Small bag of brownies found in room per [facility], disposed of   0625 ST 105 101/59 bp, rr 14, po2 96% 2 LNC awaiting  [place] MD Dr Izola Price to bedside, MD paged again  [phone] Call back received, Dr Izola Price en route to hospital now to see pt  White, James Ivanoff Emerlyn Mehlhoff

## 2011-09-05 NOTE — Progress Notes (Signed)
Patient's glucose 51 on lab work, CBG 63.  Patient awake, drank 240oz cran-grape juice.  Mom Misty Stanley brought patient coffee and breakfast.  Recheck 1 hour later, CBG 173.  Will continue to monitor.

## 2011-09-05 NOTE — Progress Notes (Signed)
  [name] is a 20 y.o. male [id number] December 09, 1990  Subjective/Objective:  Patient was admitted into ICU after respiratory distress. There is a question that the visitors  last night gave him drugs.Patient was started on methadone in the hospital he was not on methadone before  admission to the hospital I discussed the case with Dr. Darnelle Catalan and advised not to give methadone. I explained to the patient about it but patient was adamant to be started on methadone I explained to him that after he  discharge from the hospital he can followup at methadone clinic. Patient started on Klonopin detox protocol. Psychoeducation given to the patient.  Filed Vitals:   09/05/11 2001  BP:   Pulse:   Temp: 98.6 F (37 C)  Resp:     Lab Results:   BMET    Component Value Date/Time   NA 138 09/05/2011 0800   K 4.0 09/05/2011 0800   CL 98 09/05/2011 0800   CO2 32 09/05/2011 0800   GLUCOSE 51* 09/05/2011 0800   BUN 15 09/05/2011 0800   CREATININE 0.97 09/05/2011 0800   CALCIUM 9.4 09/05/2011 0800   GFRNONAA >90 09/05/2011 0800   GFRAA >90 09/05/2011 0800    Medications:  Scheduled:     . Chlorhexidine Gluconate Cloth  6 each Topical Q0600  . cloNIDine  0.1 mg Oral QID   Followed by  . cloNIDine  0.1 mg Oral BH-qamhs   Followed by  . cloNIDine  0.1 mg Oral QAC breakfast  . diphenhydrAMINE  25 mg Oral Once  . FLUoxetine  20 mg Oral Daily  . mupirocin ointment  1 application Nasal BID  . naloxone      . nicotine  21 mg Transdermal Daily  . pantoprazole  40 mg Oral Q1200  . phosphorus  250 mg Oral BID  . DISCONTD: clonazePAM  2 mg Oral BID  . DISCONTD: methadone  20 mg Oral Q8H     PRN Meds dicyclomine, haloperidol lactate, hydrOXYzine, loperamide, LORazepam, methocarbamol, naloxone (NARCAN) injection, naproxen, ondansetron, zolpidem, DISCONTD: dicyclomine, DISCONTD: diphenhydrAMINE, DISCONTD: hydrOXYzine, DISCONTD: loperamide, DISCONTD: LORazepam, DISCONTD: methocarbamol, DISCONTD:  naproxen, DISCONTD: ondansetron  Assessment/Plan: Continue current treatment plan. Patient started on clonidine detox protocol  AHLUWALIA,SHAMSHER S MD 09/05/2011

## 2011-09-05 NOTE — Progress Notes (Signed)
Came up to see Mr. Heckendorn, however, per nursing Mr. Degollado was transferred to stepdown due to his medical condition. We will follow up at another time.  Gretta Cool Antoin 09/05/2011

## 2011-09-06 MED ORDER — CLONAZEPAM 1 MG PO TABS
2.0000 mg | ORAL_TABLET | Freq: Two times a day (BID) | ORAL | Status: DC
  Administered 2011-09-06 – 2011-09-09 (×6): 2 mg via ORAL
  Filled 2011-09-06: qty 1
  Filled 2011-09-06 (×5): qty 2
  Filled 2011-09-06: qty 1
  Filled 2011-09-06: qty 2

## 2011-09-06 NOTE — Progress Notes (Signed)
PATIENT DETAILS Name: [name] Age: 20 y.o. Sex: male Date of Birth: [birth year] Admit Date: 08/29/2011 PCP:No primary provider on file.  CONSULTS: 1. Dr. Eulogio Ditch, psychiatry  Interval History: [name] is a homeless male who was admitted on 08/29/2011 by the critical care team, with psychosis and polysubstance abuse, found down in a hotel room, hypothermic and hypotensive on initial presentation. He was also found to be in acute respiratory failure and needed BiPAP. He was also being treated for a left-sided aspiration pneumonia. He was transferred to the care of the hospitalist service on 08/31/2011. His hospital course has been complicated by the development of withdrawal type symptoms. Patient has been seen by psychiatry and the recommendation is for him to go to inpatient rehabilitation once medically stable. He had been started on methadone maintenance. Early in the morning on 09/05/11, rapid response was called due to the patient becoming unresponsive and hypoxic. He responded to narcan. He, apparently, had friends visit the evening before. He has been transferred to the SDU for further monitoring. Was agitated last night, required PRN haldol/Ativan.  ROS: The patient is having some GI symptoms secondary to withdrawal: Abdominal cramps, nausea, diarrhea but no vomiting.  He feels restless at times.  He is angry that the methadone has been discontinued.     Objective: Vital signs in last 24 hours: Temp:  [97.6 F (36.4 C)-98.6 F (37 C)] 97.6 F (36.4 C) (12/08 0400) Pulse Rate:  [63-104] 69  (12/08 0400) Resp:  [8-19] 13  (12/08 0400) BP: (87-134)/(34-75) 93/58 mmHg (12/08 0100) SpO2:  [92 %-99 %] 95 % (12/08 0400) Weight:  [69.9 kg (154 lb 1.6 oz)] 154 lb 1.6 oz (69.9 kg) (12/08 0050) Weight change:  Last BM Date: 09/03/11  Intake/Output from previous day:  Intake/Output Summary (Last 24 hours) at 09/06/11 1610 Last data filed at 09/06/11 0400  Gross per  24 hour  Intake   4120 ml  Output      0 ml  Net   4120 ml     Physical Exam:  Gen: Awake and alert at present  Cardiovascular: Tachycardic, no M/R/G  Respiratory: Lungs clear to auscultation bilaterally with good air movement.  Gastrointestinal: Abdomen soft, nontender, nondistended with normal active bowel sounds.  Extremities: No clubbing, edema, or cyanosis.  Lab Results: Basic Metabolic Panel:  Lab 09/05/11 9604 09/03/11 0330 09/02/11 0405 09/01/11 0345 08/31/11 0404  NA 138 139 140 140 139  K 4.0 4.3 -- -- --  CL 98 100 104 107 105  CO2 32 30 27 23 27   GLUCOSE 51* 80 105* 100* 105*  BUN 15 10 7 6 7   CREATININE 0.97 0.95 0.85 0.72 0.80  CALCIUM 9.4 9.5 9.3 9.1 8.8  MG -- -- -- -- 2.0  PHOS -- -- -- -- 1.5*   GFR Estimated Creatinine Clearance: 119.1 ml/min (by C-G formula based on Cr of 0.97). Liver Function Tests:  Lab 09/05/11 0800 09/03/11 0330 09/02/11 0405 09/01/11 0345  AST 47* 103* 165* 218*  ALT 74* 98* 104* 99*  ALKPHOS 35* 33* 34* 31*  BILITOT 0.2* 0.5 0.3 0.3  PROT 6.9 6.5 6.3 5.9*  ALBUMIN 3.3* 3.0* 2.9* 2.5*    CBC:  Lab 09/03/11 0330 09/02/11 0405 09/01/11 0345 08/31/11 0404  WBC 11.9* 15.6* 13.1* 13.2*  NEUTROABS -- -- 8.6* 8.8*  HGB 15.1 14.8 14.3 14.8  HCT 44.5 43.5 41.7 43.6  MCV 81.4 81.3 80.3 82.0  PLT 360 357 331 318  Cardiac Enzymes:  Lab 09/05/11 0800 09/03/11 0330 09/02/11 0405 09/01/11 0345 08/31/11 0404  CKTOTAL 534* 2338* 4872* 7320* 9206*  CKMB -- -- -- -- --  CKMBINDEX -- -- -- -- --  TROPONINI -- -- -- -- --   CBG:  Lab 09/05/11 1001 09/05/11 0906  GLUCAP 173* 63*     Recent Results (from the past 240 hour(s))  MRSA PCR SCREENING     Status: Abnormal   Collection Time   08/30/11  9:34 AM      Component Value Range Status Comment   MRSA by PCR POSITIVE (*) NEGATIVE  Final     Studies/Results: No results found.  Medications: Scheduled Meds:   . Chlorhexidine Gluconate Cloth  6 each Topical Q0600  .  cloNIDine  0.1 mg Oral QID   Followed by  . cloNIDine  0.1 mg Oral BH-qamhs   Followed by  . cloNIDine  0.1 mg Oral QAC breakfast  . diphenhydrAMINE  25 mg Oral Once  . FLUoxetine  20 mg Oral Daily  . mupirocin ointment  1 application Nasal BID  . nicotine  21 mg Transdermal Daily  . pantoprazole  40 mg Oral Q1200  . phosphorus  250 mg Oral BID   Continuous Infusions:   . sodium chloride Stopped (09/05/11 2148)   PRN Meds:.dicyclomine, haloperidol lactate, hydrOXYzine, loperamide, LORazepam, methocarbamol, naloxone (NARCAN) injection, naproxen, ondansetron, zolpidem, DISCONTD: dicyclomine, DISCONTD: loperamide, DISCONTD: LORazepam, DISCONTD: naproxen, DISCONTD: ondansetron Antibiotics: Anti-infectives     Start     Dose/Rate Route Frequency Ordered Stop   09/02/11 1000   azithromycin (ZITHROMAX) tablet 250 mg  Status:  Discontinued        250 mg Oral Daily 09/02/11 0702 09/03/11 0909   09/02/11 0700   azithromycin (ZITHROMAX) 500 mg in dextrose 5 % 250 mL IVPB  Status:  Discontinued        500 mg 250 mL/hr over 60 Minutes Intravenous Every 24 hours 09/02/11 0654 09/02/11 0702   08/29/11 2000   ampicillin-sulbactam (UNASYN) 1.5 g in sodium chloride 0.9 % 50 mL IVPB  Status:  Discontinued        1.5 g 100 mL/hr over 30 Minutes Intravenous Every 6 hours 08/29/11 1806 09/02/11 0654           Assessment/Plan:  Principal Problem:  *Acute respiratory failure  Assessment: Secondary to heroin overdose. Resolved initially, now with reoccurance on 09/05/11, with suspected drug ingestion.  Plan: Transfer patient back to floor. UDS negative, serum pending. Active Problems:  Aspiration pneumonia  Assessment: Asymptomatic.  Plan: Discontinued antibiotics.  Polysubstance dependence including opioid type drug, episodic abuse  Assessment: Heroin dependency.  Plan: Social work consultation for inpatient rehabilitation, homeless shelters, and outpatient ADS services. Patient now off  methadone.  Will resume home dose of klonopin for improved symptom control with restlessness/agitation. Rhabdomyolysis  Assessment: Secondary to heroin use.  Plan: CK levels continue to decline.  Elevated LFTs  Assessment: Likely reactive.  Plan: Transaminase is beginning to decline. Periodically monitor.  Tobacco abuse  Assessment: Chronic.  Plan: Cessation counseling. Continue nicotine patch.  Disposition Awaiting SW input regarding acceptance to an inpatient treatment facility.     LOS: 8 days   Hillery Aldo, MD Pager [phone]  09/06/2011, 8:12 AM

## 2011-09-06 NOTE — Progress Notes (Signed)
Patient to transfer to room 1517.  Report given to [employer], Charity fundraiser.  Patient to travel by wheelchair to new room.  Will continue to monitor.

## 2011-09-07 DIAGNOSIS — Z720 Tobacco use: Secondary | ICD-10-CM | POA: Diagnosis present

## 2011-09-07 NOTE — Progress Notes (Signed)
PATIENT DETAILS Name: [name] Age: 20 y.o. Sex: male Date of Birth: 03/31/1991 Admit Date: 08/29/2011 PCP:No primary provider on file.  CONSULTS: 1. Dr. Eulogio Ditch, psychiatry  Interval History: [name] is a homeless male who was admitted on 08/29/2011 by the critical care team, with psychosis and polysubstance abuse, found down in a hotel room, hypothermic and hypotensive on initial presentation. He was also found to be in acute respiratory failure and needed BiPAP. He was also being treated for a left-sided aspiration pneumonia. He was transferred to the care of the hospitalist service on 08/31/2011. His hospital course has been complicated by the development of withdrawal type symptoms. Patient has been seen by psychiatry and the recommendation is for him to go to inpatient rehabilitation once medically stable. He had been started on methadone maintenance. Early in the morning on 09/05/11, rapid response was called due to the patient becoming unresponsive and hypoxic. He responded to narcan. He, apparently, had friends visit the evening before. He had to be transferred to the SDU for further monitoring, but is now back on the med-psych unit.  The nurses report that he may have tampered with the sharps container and had to remove it from his room (The safety was broken off the top).  He also has been interfering with the nurses when getting his IV ativan, pushing the plunger in faster, then claiming he was "helping" because he thought it was a saline flush and the nurse forgot about it.  ROS: Claims to have nausea, decreased appetite and loose stools.  Denies attempts to tamper with the sharps container or with the nurses giving him his medicine.     Objective: Vital signs in last 24 hours: Temp:  [98.1 F (36.7 C)-98.9 F (37.2 C)] 98.9 F (37.2 C) (12/09 0602) Pulse Rate:  [62-73] 63  (12/09 0602) Resp:  [9-18] 18  (12/09 0602) BP: (104-119)/(61-73) 119/70 mmHg (12/09  0800) SpO2:  [97 %-100 %] 100 % (12/09 0602) Weight:  [65.3 kg (143 lb 15.4 oz)] 143 lb 15.4 oz (65.3 kg) (12/08 1315) Weight change: -4.6 kg (-10 lb 2.3 oz) Last BM Date: 09/06/11  Intake/Output from previous day:  Intake/Output Summary (Last 24 hours) at 09/07/11 1127 Last data filed at 09/07/11 0925  Gross per 24 hour  Intake    480 ml  Output      0 ml  Net    480 ml   Physical Exam:  Gen: Awake and alert at present  Cardiovascular: Tachycardic, no M/R/G  Respiratory: Lungs clear to auscultation bilaterally with good air movement.  Gastrointestinal: Abdomen soft, nontender, nondistended with normal active bowel sounds.  Extremities: No clubbing, edema, or cyanosis.  Lab Results: Basic Metabolic Panel:  Lab 09/05/11 9604 09/03/11 0330 09/02/11 0405 09/01/11 0345  NA 138 139 140 140  K 4.0 4.3 -- --  CL 98 100 104 107  CO2 32 30 27 23   GLUCOSE 51* 80 105* 100*  BUN 15 10 7 6   CREATININE 0.97 0.95 0.85 0.72  CALCIUM 9.4 9.5 9.3 9.1  MG -- -- -- --  PHOS -- -- -- --   GFR Estimated Creatinine Clearance: 111.3 ml/min (by C-G formula based on Cr of 0.97). Liver Function Tests:  Lab 09/05/11 0800 09/03/11 0330 09/02/11 0405 09/01/11 0345  AST 47* 103* 165* 218*  ALT 74* 98* 104* 99*  ALKPHOS 35* 33* 34* 31*  BILITOT 0.2* 0.5 0.3 0.3  PROT 6.9 6.5 6.3 5.9*  ALBUMIN 3.3*  3.0* 2.9* 2.5*    CBC:  Lab 09/03/11 0330 09/02/11 0405 09/01/11 0345  WBC 11.9* 15.6* 13.1*  NEUTROABS -- -- 8.6*  HGB 15.1 14.8 14.3  HCT 44.5 43.5 41.7  MCV 81.4 81.3 80.3  PLT 360 357 331   Cardiac Enzymes:  Lab 09/05/11 0800 09/03/11 0330 09/02/11 0405 09/01/11 0345  CKTOTAL 534* 2338* 4872* 7320*  CKMB -- -- -- --  CKMBINDEX -- -- -- --  TROPONINI -- -- -- --   CBG:  Lab 09/05/11 1001 09/05/11 0906  GLUCAP 173* 63*    Recent Results (from the past 240 hour(s))  MRSA PCR SCREENING     Status: Abnormal   Collection Time   08/30/11  9:34 AM      Component Value Range Status  Comment   MRSA by PCR POSITIVE (*) NEGATIVE  Final     Studies/Results: No results found.  Medications: Scheduled Meds:   . Chlorhexidine Gluconate Cloth  6 each Topical Q0600  . clonazePAM  2 mg Oral Q12H  . cloNIDine  0.1 mg Oral QID   Followed by  . cloNIDine  0.1 mg Oral BH-qamhs   Followed by  . cloNIDine  0.1 mg Oral QAC breakfast  . diphenhydrAMINE  25 mg Oral Once  . FLUoxetine  20 mg Oral Daily  . nicotine  21 mg Transdermal Daily  . pantoprazole  40 mg Oral Q1200  . phosphorus  250 mg Oral BID   Continuous Infusions:  PRN Meds:.dicyclomine, haloperidol lactate, hydrOXYzine, loperamide, LORazepam, methocarbamol, naloxone (NARCAN) injection, naproxen, ondansetron, zolpidem Antibiotics: Anti-infectives     Start     Dose/Rate Route Frequency Ordered Stop   09/02/11 1000   azithromycin (ZITHROMAX) tablet 250 mg  Status:  Discontinued        250 mg Oral Daily 09/02/11 0702 09/03/11 0909   09/02/11 0700   azithromycin (ZITHROMAX) 500 mg in dextrose 5 % 250 mL IVPB  Status:  Discontinued        500 mg 250 mL/hr over 60 Minutes Intravenous Every 24 hours 09/02/11 0654 09/02/11 0702   08/29/11 2000   ampicillin-sulbactam (UNASYN) 1.5 g in sodium chloride 0.9 % 50 mL IVPB  Status:  Discontinued        1.5 g 100 mL/hr over 30 Minutes Intravenous Every 6 hours 08/29/11 1806 09/02/11 0654           Assessment/Plan: Principal Problem:  *Acute respiratory failure  Assessment: Secondary to heroin overdose. Resolved initially, now with reoccurance on 09/05/11, with suspected drug ingestion.  Plan: Transfered patient back to floor 09/06/11. UDS negative, serum pending.  Active Problems:  Polysubstance dependence including opioid type drug, episodic abuse  Assessment: Heroin dependency.  Plan: Social work consultation for inpatient rehabilitation, homeless shelters, and outpatient ADS services. Patient now off methadone. Resumed home dose of klonopin on 09/06/11 for improved  symptom control with restlessness/agitation.  Rhabdomyolysis  Assessment: Secondary to heroin use.  Plan: CK levels continue to decline.  Elevated LFTs  Assessment: Likely reactive.  Plan: Transaminase is beginning to decline. Periodically monitor.  Tobacco abuse  Assessment: Chronic.  Plan: Cessation counseling. Continue nicotine patch.  Disposition  Awaiting SW input regarding acceptance to an inpatient treatment facility.    LOS: 9 days   Hillery Aldo, MD Pager [phone]  09/07/2011, 11:27 AM

## 2011-09-08 LAB — COMPREHENSIVE METABOLIC PANEL
ALT: 49 U/L (ref 0–53)
AST: 31 U/L (ref 0–37)
Alkaline Phosphatase: 37 U/L — ABNORMAL LOW (ref 39–117)
CO2: 29 mEq/L (ref 19–32)
GFR calc Af Amer: >90 mL/min (ref >90)
GFR calc non Af Amer: >90 mL/min (ref >90)
Glucose, Bld: 68 mg/dL — ABNORMAL LOW (ref 70–99)
Potassium: 3.8 mEq/L (ref 3.5–5.1)
Sodium: 136 mEq/L (ref 135–145)
Total Protein: 6.9 g/dL (ref 6.0–8.3)

## 2011-09-08 LAB — CBC
HCT: 43.3 % (ref 39.0–52.0)
MCH: 27.5 pg (ref 26.0–34.0)
MCHC: 33.7 g/dL (ref 30.0–36.0)
RDW: 12.6 % (ref 11.5–15.5)

## 2011-09-08 LAB — CK: Total CK: 117 U/L (ref 7–232)

## 2011-09-08 NOTE — Progress Notes (Signed)
Per Dr. Darnelle Catalan.  Pt medically cleared.  Spoke with Pt who is wanting to attend the [facility].  Pt and CSW completed the application.  Faxed application to [employer] at [facility].  Notified MD.  CSW to follow up with [facility].  Providence Crosby, LCSWA Clinical Social Work [phone]

## 2011-09-08 NOTE — Progress Notes (Signed)
  [name] is a 20 y.o. male [id number] Apr 11, 1991  Subjective/Objective:  The nurses report that he may have tampered with the sharps container and had to remove it from his room (The safety was broken off the top). He also has been interfering with the nurses when getting his IV ativan, pushing the plunger in faster, then claiming he was "helping" because he thought it was a saline flush and the nurse forgot about it.  I discussed with the patient about his behavior he adamantly denied about these things and  reported his willingness to be admitted to the inpatient setting and stay away from the drugs.   Filed Vitals:   09/08/11 1133  BP: 101/40  Pulse: 65  Temp: 98.1 F (36.7 C)  Resp: 18    Lab Results:   BMET    Component Value Date/Time   NA 136 09/08/2011 0522   K 3.8 09/08/2011 0522   CL 97 09/08/2011 0522   CO2 29 09/08/2011 0522   GLUCOSE 68* 09/08/2011 0522   BUN 8 09/08/2011 0522   CREATININE 0.79 09/08/2011 0522   CALCIUM 9.9 09/08/2011 0522   GFRNONAA >90 09/08/2011 0522   GFRAA >90 09/08/2011 0522    Medications:  Scheduled:     . clonazePAM  2 mg Oral Q12H  . cloNIDine  0.1 mg Oral QID   Followed by  . cloNIDine  0.1 mg Oral BH-qamhs   Followed by  . cloNIDine  0.1 mg Oral QAC breakfast  . diphenhydrAMINE  25 mg Oral Once  . FLUoxetine  20 mg Oral Daily  . nicotine  21 mg Transdermal Daily  . pantoprazole  40 mg Oral Q1200  . phosphorus  250 mg Oral BID     PRN Meds dicyclomine, haloperidol lactate, hydrOXYzine, loperamide, LORazepam, methocarbamol, naloxone (NARCAN) injection, naproxen, ondansetron, zolpidem  Assessment/Plan:  We'll continue with current treatment plan.  AHLUWALIA,SHAMSHER S MD 09/08/2011

## 2011-09-08 NOTE — Progress Notes (Signed)
PATIENT DETAILS Name: [name] Age: 20 y.o. Sex: male Date of Birth: 1990-11-21 Admit Date: 08/29/2011 PCP:No primary provider on file.  CONSULTS: 1. Dr. Eulogio Ditch, psychiatry   Interval History: [name] is a homeless male who was admitted on 08/29/2011 by the critical care team, with psychosis and polysubstance abuse, found down in a hotel room, hypothermic and hypotensive on initial presentation. He was also found to be in acute respiratory failure and needed BiPAP. He was also being treated for a left-sided aspiration pneumonia. He was transferred to the care of the hospitalist service on 08/31/2011. His hospital course has been complicated by the development of withdrawal type symptoms. Patient has been seen by psychiatry and the recommendation is for him to go to inpatient rehabilitation once medically stable. He had been started on methadone maintenance. Early in the morning on 09/05/11, rapid response was called due to the patient becoming unresponsive and hypoxic. He responded to narcan. He, apparently, had friends visit the evening before. He had to be transferred to the SDU for further monitoring, but is now back on the med-psych unit. The nurses report that he may have tampered with the sharps container and had to remove it from his room (The safety was broken off the top). He also has been interfering with the nurses when getting his IV ativan, pushing the plunger in faster, then claiming he was "helping" because he thought it was a saline flush and the nurse forgot about it.  He is likely to go to inpatient drug rehab 09/09/11.  ROS: States he hasn't eaten anything today due to nausea.  Still having some diarrhea.  Very medication seeking.   Objective: Vital signs in last 24 hours: Temp:  [97.7 F (36.5 C)-98.4 F (36.9 C)] 97.7 F (36.5 C) (12/10 1409) Pulse Rate:  [59-65] 59  (12/10 1409) Resp:  [18] 18  (12/10 1409) BP: (101-115)/(40-64) 115/64 mmHg (12/10  1409) SpO2:  [96 %-100 %] 100 % (12/10 1409) Weight change:  Last BM Date: 09/06/11  Intake/Output from previous day:  Intake/Output Summary (Last 24 hours) at 09/08/11 1730 Last data filed at 09/08/11 0900  Gross per 24 hour  Intake    720 ml  Output      0 ml  Net    720 ml     Physical Exam:  Gen: Awake and alert at present  Cardiovascular: Tachycardic, no M/R/G  Respiratory: Lungs clear to auscultation bilaterally with good air movement.  Gastrointestinal: Abdomen soft, nontender, nondistended with normal active bowel sounds.  Extremities: No clubbing, edema, or cyanosis.    Lab Results: Basic Metabolic Panel:  Lab 09/08/11 4098 09/05/11 0800 09/03/11 0330 09/02/11 0405  NA 136 138 139 140  K 3.8 4.0 -- --  CL 97 98 100 104  CO2 29 32 30 27  GLUCOSE 68* 51* 80 105*  BUN 8 15 10 7   CREATININE 0.79 0.97 0.95 0.85  CALCIUM 9.9 9.4 9.5 9.3  MG -- -- -- --  PHOS -- -- -- --   GFR Estimated Creatinine Clearance: 134.9 ml/min (by C-G formula based on Cr of 0.79). Liver Function Tests:  Lab 09/08/11 0522 09/05/11 0800 09/03/11 0330 09/02/11 0405  AST 31 47* 103* 165*  ALT 49 74* 98* 104*  ALKPHOS 37* 35* 33* 34*  BILITOT 0.4 0.2* 0.5 0.3  PROT 6.9 6.9 6.5 6.3  ALBUMIN 3.1* 3.3* 3.0* 2.9*    CBC:  Lab 09/08/11 0522 09/03/11 0330 09/02/11 0405  WBC  9.6 11.9* 15.6*  NEUTROABS -- -- --  HGB 14.6 15.1 14.8  HCT 43.3 44.5 43.5  MCV 81.5 81.4 81.3  PLT 383 360 357   Cardiac Enzymes:  Lab 09/08/11 0522 09/05/11 0800 09/03/11 0330 09/02/11 0405  CKTOTAL 117 534* 2338* 4872*  CKMB -- -- -- --  CKMBINDEX -- -- -- --  TROPONINI -- -- -- --   CBG:  Lab 09/05/11 1001 09/05/11 0906  GLUCAP 173* 63*     Recent Results (from the past 240 hour(s))  MRSA PCR SCREENING     Status: Abnormal   Collection Time   08/30/11  9:34 AM      Component Value Range Status Comment   MRSA by PCR POSITIVE (*) NEGATIVE  Final     Studies/Results: No results  found.  Medications: Scheduled Meds:   . clonazePAM  2 mg Oral Q12H  . cloNIDine  0.1 mg Oral QID   Followed by  . cloNIDine  0.1 mg Oral BH-qamhs   Followed by  . cloNIDine  0.1 mg Oral QAC breakfast  . diphenhydrAMINE  25 mg Oral Once  . FLUoxetine  20 mg Oral Daily  . nicotine  21 mg Transdermal Daily  . pantoprazole  40 mg Oral Q1200  . phosphorus  250 mg Oral BID   Continuous Infusions:  PRN Meds:.dicyclomine, haloperidol lactate, hydrOXYzine, loperamide, LORazepam, methocarbamol, naloxone (NARCAN) injection, naproxen, ondansetron, zolpidem Antibiotics: Anti-infectives     Start     Dose/Rate Route Frequency Ordered Stop   09/02/11 1000   azithromycin (ZITHROMAX) tablet 250 mg  Status:  Discontinued        250 mg Oral Daily 09/02/11 0702 09/03/11 0909   09/02/11 0700   azithromycin (ZITHROMAX) 500 mg in dextrose 5 % 250 mL IVPB  Status:  Discontinued        500 mg 250 mL/hr over 60 Minutes Intravenous Every 24 hours 09/02/11 0654 09/02/11 0702   08/29/11 2000   ampicillin-sulbactam (UNASYN) 1.5 g in sodium chloride 0.9 % 50 mL IVPB  Status:  Discontinued        1.5 g 100 mL/hr over 30 Minutes Intravenous Every 6 hours 08/29/11 1806 09/02/11 0654           Assessment/Plan: Principal Problem:  *Acute respiratory failure  Assessment: Secondary to heroin overdose. Resolved initially, now with reoccurance on 09/05/11, with suspected drug ingestion.  Plan: Transfered patient back to floor 09/06/11. UDS negative, serum pending.  Active Problems:  Polysubstance dependence including opioid type drug, episodic abuse  Assessment: Heroin dependency.  Plan: Social work consultation for inpatient rehabilitation, homeless shelters, and outpatient ADS services. Patient now off methadone. Resumed home dose of klonopin on 09/06/11 for improved symptom control with restlessness/agitation. Continues to be very medication seeking.  High risk for relapse, so will keep in the hospital while  awaiting [facility] program acceptance. Rhabdomyolysis  Assessment: Secondary to heroin use. Resolved. Plan: No further CK checks planned. Elevated LFTs  Assessment: Likely reactive.  Plan: Transaminases normalized. Tobacco abuse  Assessment: Chronic.  Plan: Cessation counseling. Continue nicotine patch.  Disposition  Awaiting SW input regarding acceptance to an inpatient treatment facility at [facility].  LOS: 10 days   Hillery Aldo, MD Pager [phone]  09/08/2011, 5:30 PM

## 2011-09-09 MED ORDER — CLONAZEPAM 0.5 MG PO TABS: 1.5000 mg | ORAL_TABLET | Freq: Two times a day (BID) | ORAL | Status: DC | PRN

## 2011-09-09 NOTE — Progress Notes (Signed)
Provided Heather at the [facility] with all necessary information.  Pt accepted at the program.  Pt to be picked up by the [facility] tomorrow at 10:00.  Contacted the LME at [phone].  Spoke with representative and provided her with Pt's information.  Notified MD, RN and Pt.  Pt to be d/c'd tomorrow to the [facility] program tomorrow at 10.  Providence Crosby, LCSWA Clinical Social Work [phone]

## 2011-09-09 NOTE — Discharge Summary (Signed)
Physician Discharge Summary  Patient ID: [name] [facility] MRN: [id number] DOB/AGE: March 16, 1991 20 y.o.  Admit date: 08/29/2011 Discharge date: 09/09/2011  Primary Care Physician:  No primary provider on file.   Discharge Diagnoses:    Present on Admission:  .Acute respiratory failure .Aspiration pneumonia .Rhabdomyolysis .Polysubstance dependence including opioid type drug, episodic abuse .Elevated LFTs .Tobacco abuse  Discharge Medications:  Current Discharge Medication List    CONTINUE these medications which have CHANGED   Details  clonazePAM (KLONOPIN) 0.5 MG tablet Take 3 tablets (1.5 mg total) by mouth 2 (two) times daily as needed for anxiety. Take 1.5 mg 3 times a day x3 days then 1 mg twice a day x3 days then 0.5 mg daily x3 days then 0.5 mg daily x7 days then discontinue. Qty: 42 tablet, Refills: 0      CONTINUE these medications which have NOT CHANGED   Details  FLUoxetine (PROZAC) 20 MG capsule Take 20 mg by mouth daily. TAKE 3 CAPSULES BY MOUTH EVERY MORNING       STOP taking these medications     amphetamine-dextroamphetamine (ADDERALL XR) 20 MG 24 hr capsule      zolpidem (AMBIEN) 10 MG tablet      amphetamine-dextroamphetamine (ADDERALL XR) 20 MG 24 hr capsule          Disposition and Follow-up: The patient is being discharged to an inpatient drug addiction program.  Consults:  1. Dr. Eulogio Ditch, psychiatry   Significant Diagnostic Studies:  Dg Chest 2 View  09/02/2011  *RADIOLOGY REPORT*  Clinical Data: Acute respiratory failure secondary to heroin overdose  CHEST - 2 VIEW  Comparison: 08/29/2011  Findings: Heart size appears normal.  No pleural effusion or interstitial edema identified.  Streaky opacities within the left lung appear improved from previous exam.  Right lung is clear.  IMPRESSION:  1.  Improvement in aeration to the left lung.  No new findings.  Original Report Authenticated By: Rosealee Albee, M.D.   Dg Chest Port 1  View  08/29/2011  *RADIOLOGY REPORT*  Clinical Data: Overdose  PORTABLE CHEST - 1 VIEW  Comparison: Chest x-ray of 06/11/2011  Findings: There is abnormal opacity at the left lung base which may represent pneumonia, possibly due to aspiration.  The right lung is clear.  Heart size is stable.  No bony abnormality is seen.  IMPRESSION: Opacity at the left lung base.  Possible pneumonia as with aspiration.  Original Report Authenticated By: Juline Patch, M.D.    Discharge Laboratory Values: Basic Metabolic Panel:  Lab 09/08/11 4098 09/05/11 0800 09/03/11 0330  NA 136 138 139  K 3.8 4.0 --  CL 97 98 100  CO2 29 32 30  GLUCOSE 68* 51* 80  BUN 8 15 10   CREATININE 0.79 0.97 0.95  CALCIUM 9.9 9.4 9.5  MG -- -- --  PHOS -- -- --   GFR Estimated Creatinine Clearance: 134.9 ml/min (by C-G formula based on Cr of 0.79). Liver Function Tests:  Lab 09/08/11 0522 09/05/11 0800 09/03/11 0330  AST 31 47* 103*  ALT 49 74* 98*  ALKPHOS 37* 35* 33*  BILITOT 0.4 0.2* 0.5  PROT 6.9 6.9 6.5  ALBUMIN 3.1* 3.3* 3.0*    CBC:  Lab 09/08/11 0522 09/03/11 0330  WBC 9.6 11.9*  NEUTROABS -- --  HGB 14.6 15.1  HCT 43.3 44.5  MCV 81.5 81.4  PLT 383 360   Cardiac Enzymes:  Lab 09/08/11 0522 09/05/11 0800 09/03/11 0330  CKTOTAL 117 534*  2338*  CKMB -- -- --  CKMBINDEX -- -- --  TROPONINI -- -- --   CBG:  Lab 09/05/11 1001 09/05/11 0906  GLUCAP 173* 63*     Brief H and P: For complete details please refer to admission H and P, but in brief, [name] is a homeless male who was admitted on 08/29/2011 by the critical care team, with psychosis and polysubstance abuse, found down in a hotel room, hypothermic and hypotensive on initial presentation. He was also found to be in acute respiratory failure and needed BiPAP.    Physical Exam at Discharge: BP 97/51  Pulse 59  Temp(Src) 97.7 F (36.5 C) (Oral)  Resp 12  Ht 6' (1.829 m)  Wt 65.3 kg (143 lb 15.4 oz)  BMI 19.52 kg/m2  SpO2  98% Gen: Awake and alert at present  Cardiovascular: Tachycardic, no M/R/G  Respiratory: Lungs clear to auscultation bilaterally with good air movement.  Gastrointestinal: Abdomen soft, nontender, nondistended with normal active bowel sounds.  Extremities: No clubbing, edema, or cyanosis.  Hospital Course:  Principal Problem:  *Polysubstance dependence including opioid type drug, episodic abuse causing acute respiratory failure The patient was initially admitted by the critical care team. He was given Narcan and treated with BiPAP with resolution of his respiratory failure. He was transferred to the care of the hospitalist service on 08/31/2011. He developed significant withdrawal type symptoms, mostly of a GI nature. He was subsequently put on methadone by psychiatry. Patient had recurrent respiratory failure on 09/05/2011, thought to be due to methadone. Methadone was subsequently discontinued and he was put on a clonidine withdrawal protocol and when necessary Ativan. The patient continues to be very medication seeking and complains of withdrawal type symptoms out of proportion to exam findings. He is at high-risk for relapse and the plan is to discharge him to an inpatient rehabilitation facility on 09/10/2011. The patient has been on chronic Klonopin and we have recommended a taper as outlined above. Active Problems:  Rhabdomyolysis The patient's rhabdomyolysis was felt to be secondary to heroin abuse. His CK levels were monitored closely and he was hydrated vigorously. He had resolution of his CK elevation over the course of his hospitalization.  Elevated LFTs The patient had elevated transaminases. These were monitored closely and resolved over the course of his hospital stay. His is most likely reactive and related to his drug abuse.  Tobacco abuse The patient was provided with a nicotine patch while in hospital.   Diet: Regular.  Activity: No restrictions.  Time spent on Discharge: 35  minutes.  Signed: Dr. Trula Ore Mieczyslaw Stamas Pager [phone] 09/09/2011, 6:02 PM

## 2011-09-09 NOTE — Consult Note (Signed)
  ANITA MCADORY is a 20 y.o. male [id number] May 17, 1991  Subjective/Objective:  The patient is doing better. He is not hallucinating or delusional. Not suicidal or homicidal. I told the patient that he will be going to the [facility] but he will not be able to get Klonopin , methadone  Adderall. Patient agrees with that. Psychoeducation given to the patient Klonopin discontinued.  Filed Vitals:   09/09/11 0854  BP: 101/58  Pulse: 57  Temp: 98.4 F (36.9 C)  Resp: 18    Lab Results:   BMET    Component Value Date/Time   NA 136 09/08/2011 0522   K 3.8 09/08/2011 0522   CL 97 09/08/2011 0522   CO2 29 09/08/2011 0522   GLUCOSE 68* 09/08/2011 0522   BUN 8 09/08/2011 0522   CREATININE 0.79 09/08/2011 0522   CALCIUM 9.9 09/08/2011 0522   GFRNONAA >90 09/08/2011 0522   GFRAA >90 09/08/2011 0522    Medications:  Scheduled:     . clonazePAM  2 mg Oral Q12H  . cloNIDine  0.1 mg Oral BH-qamhs   Followed by  . cloNIDine  0.1 mg Oral QAC breakfast  . diphenhydrAMINE  25 mg Oral Once  . FLUoxetine  20 mg Oral Daily  . nicotine  21 mg Transdermal Daily  . pantoprazole  40 mg Oral Q1200  . phosphorus  250 mg Oral BID     PRN Meds dicyclomine, haloperidol lactate, hydrOXYzine, loperamide, LORazepam, methocarbamol, naloxone (NARCAN) injection, naproxen, ondansetron, zolpidem  Assessment/Plan:  Patient referred to [facility].  Katheren Puller MD 09/09/2011

## 2011-09-10 LAB — DRUG SCREEN PANEL (SERUM)

## 2011-09-10 NOTE — Discharge Summary (Addendum)
DISCHARGE SUMMARY  [name] [place]  MR#: [id number]  DOB:1991/06/04  Date of Admission: 08/29/2011 Date of Discharge: 09/10/2011  Attending Physician:Kaidon Kinker  Patient's PCP:No primary provider on file.  Consults: psychiatry consult.   Discharge Diagnoses: Present on Admission:  .Acute respiratory failure .Aspiration pneumonia .Rhabdomyolysis .Polysubstance dependence including opioid type drug, episodic abuse .Elevated LFTs .Tobacco abuse    Current Discharge Medication List    CONTINUE these medications which have NOT CHANGED   Details  FLUoxetine (PROZAC) 20 MG capsule Take 20 mg by mouth daily. TAKE 3 CAPSULES BY MOUTH EVERY MORNING       STOP taking these medications     amphetamine-dextroamphetamine (ADDERALL XR) 20 MG 24 hr capsule      clonazePAM (KLONOPIN) 0.5 MG tablet      clonazePAM (KLONOPIN) 2 MG tablet      zolpidem (AMBIEN) 10 MG tablet      amphetamine-dextroamphetamine (ADDERALL XR) 20 MG 24 hr capsule           Hospital Course: Principal Problem:  *Polysubstance dependence including opioid type drug, episodic abuse causing acute respiratory failure  The patient was initially admitted by the critical care team. He was given Narcan and treated with BiPAP with resolution of his respiratory failure. He was transferred to the care of the hospitalist service on 08/31/2011. He developed significant withdrawal type symptoms, mostly of a GI nature. He was subsequently put on methadone by psychiatry. Patient had recurrent respiratory failure on 09/05/2011, thought to be due to methadone. Methadone was subsequently discontinued and he was put on a clonidine withdrawal protocol and when necessary Ativan. The patient continues to be very medication seeking and complains of withdrawal type symptoms out of proportion to exam findings. He is at high-risk for relapse and the plan is to discharge him to an inpatient rehabilitation facility on  09/10/2011. Rhabdomyolysis  The patient's rhabdomyolysis was felt to be secondary to heroin abuse. His CK levels were monitored closely and he was hydrated vigorously. He had resolution of his CK elevation over the course of his hospitalization.  Elevated LFTs  The patient had elevated transaminases. These were monitored closely and resolved over the course of his hospital stay. His is most likely reactive and related to his drug abuse.  Tobacco abuse  The patient was provided with a nicotine patch while in hospital.    Day of Discharge BP 99/50  Pulse 60  Temp(Src) 99 F (37.2 C) (Oral)  Resp 18  Ht 6' (1.829 m)  Wt 65.3 kg (143 lb 15.4 oz)  BMI 19.52 kg/m2  SpO2 98%  Physical Exam: Gen: Awake and alert at present  Cardiovascular: heart sounds normal, no M/R/G  Respiratory: Lungs clear to auscultation bilaterally with good air movement.  Gastrointestinal: Abdomen soft, nontender, nondistended with normal active bowel sounds.  Extremities: No clubbing, edema, or cyanosis.   No results found for this or any previous visit (from the past 24 hour(s)).  Disposition: Discharge to Inpatient Rehabilitation Program.    Follow-up Appts: Discharge Orders    Future Orders Please Complete By Expires   Diet general      Diet general      Call MD for:  persistant nausea and vomiting      Call MD for:  severe uncontrolled pain      Call MD for:  difficulty breathing, headache or visual disturbances      Increase activity slowly      Discharge instructions      Comments:  Follow up with pcp as needed.            SignedKathlen Montoya 09/10/2011, 11:06 AM

## 2011-09-10 NOTE — Progress Notes (Signed)
He is d/c'd. Ambulatory without problem with his community liaison; and driver from the facility to which he is about to be admitted.[name]

## 2012-10-10 IMAGING — CR DG CHEST 1V PORT
1 series · 1 of 1 positions shown · non-contrast
Comparison: 06/05/2011

CLINICAL DATA: Left IJ line placement.

PORTABLE CHEST - 1 VIEW

[view not recorded]
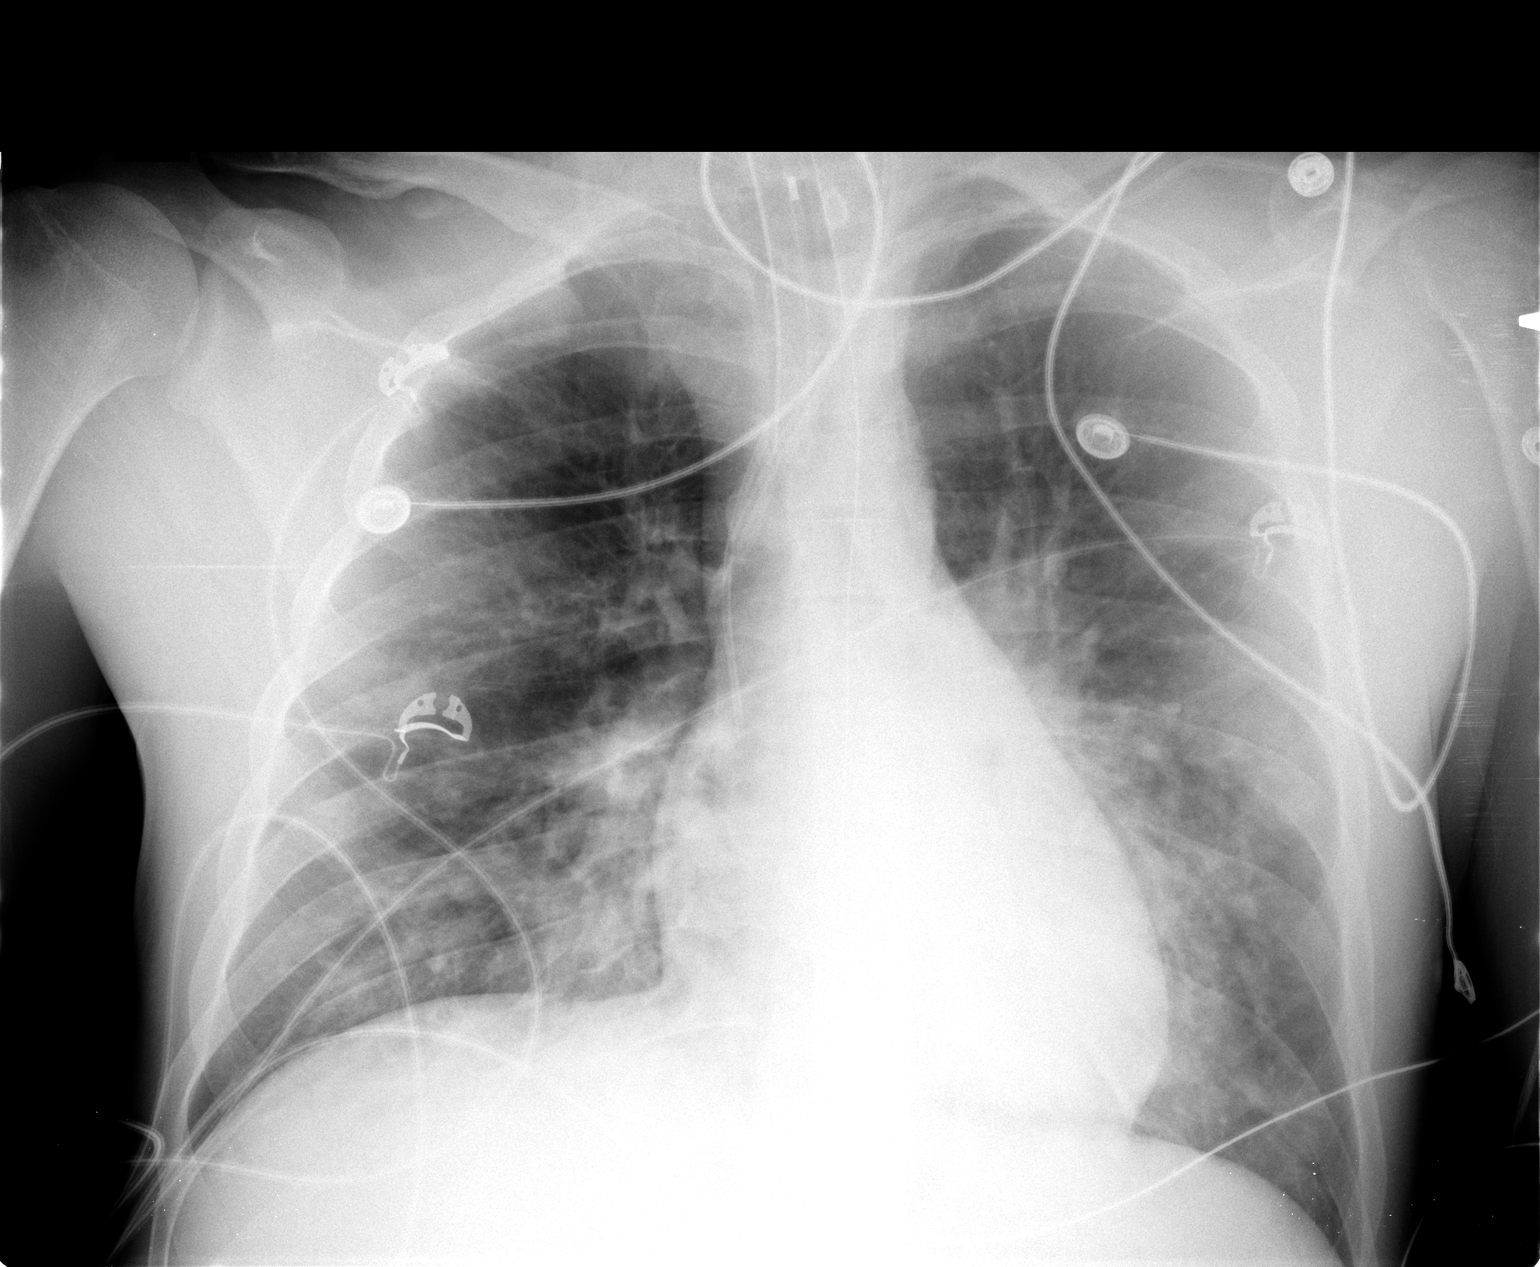

[1 of 1 positions shown; findings below may reference images not displayed]

FINDINGS: Endotracheal tube is in place with tip 3.1 cm above
carina.  Nasogastric tube is in place with tip off the film but at
least to the level of the esophagogastric junction.  Left IJ
central line tip overlies the level of superior vena cava.  Heart
size is normal.  There are dense opacities at the lung bases
bilaterally, left greater than right.  These appear more confluent
compared with earlier exam and CT.  No evidence for pneumothorax.
IMPRESSION: 1.  Interval placement of left IJ central line.  No pneumothorax.
2.  Progressive airspace filling bilaterally.

## 2012-10-10 IMAGING — CT CT ANGIO CHEST
2 of 6 series · 19 of 46 positions shown · IV contrast (APPLIED)
Comparison: Chest x-ray 06/05/2011

CLINICAL DATA: Status post intubation.  Overdose heroin cocaine.
Unresponsive.  Seizures since yesterday.

CT ANGIOGRAPHY CHEST WITH CONTRAST
TECHNIQUE: Multidetector CT imaging of the chest was performed
using the standard protocol during bolus administration of
intravenous contrast.  Multiplanar CT image reconstructions
including MIPs were obtained to evaluate the vascular anatomy.
Contrast:  80 ml Lmnipaque-OII

[Series 8: pulm embolism 1.0 b25f thin · axial · 0.68mm/px · z∈[-575,-302]mm · 16 of 301 slices shown]
[im 14/301  lung]
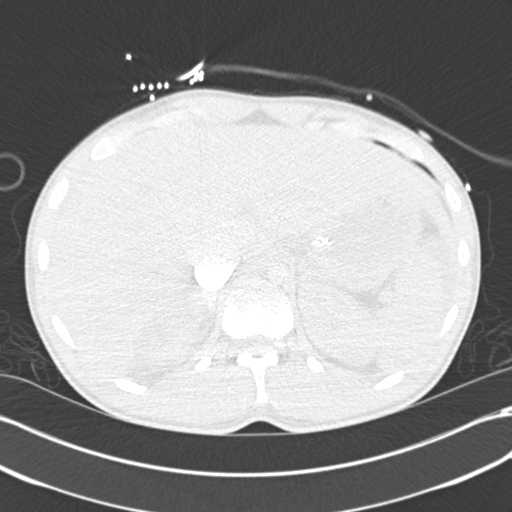
[im 40/301  soft-tissue]
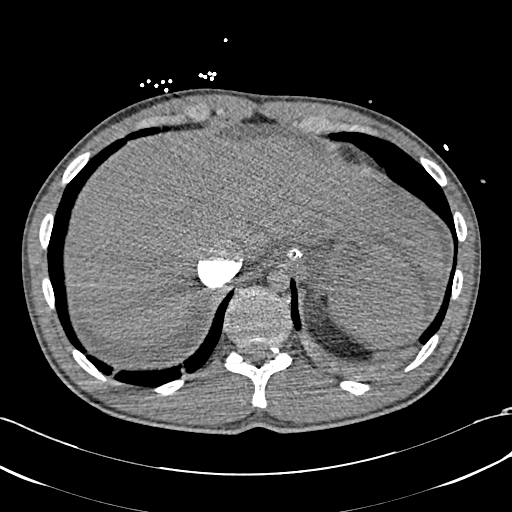
[im 53/301  lung]
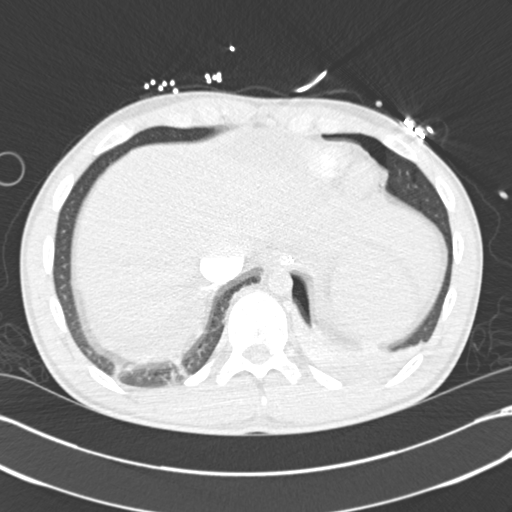
[im 66/301  soft-tissue]
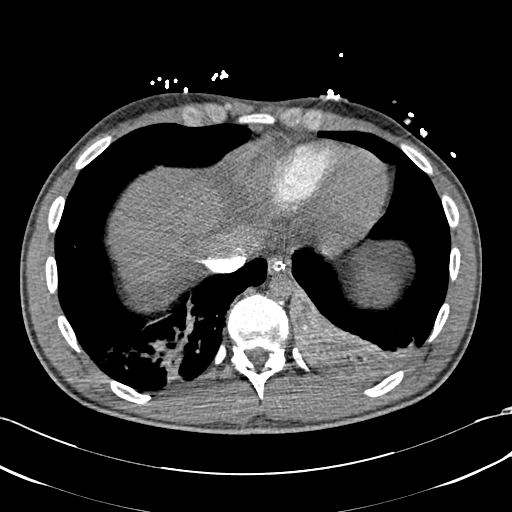
[im 92/301  lung]
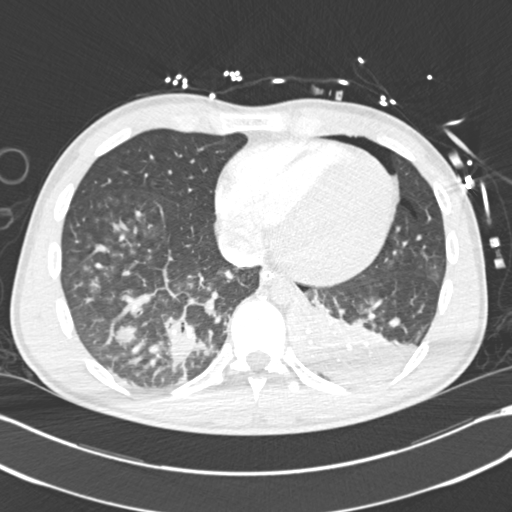
[im 105/301  soft-tissue]
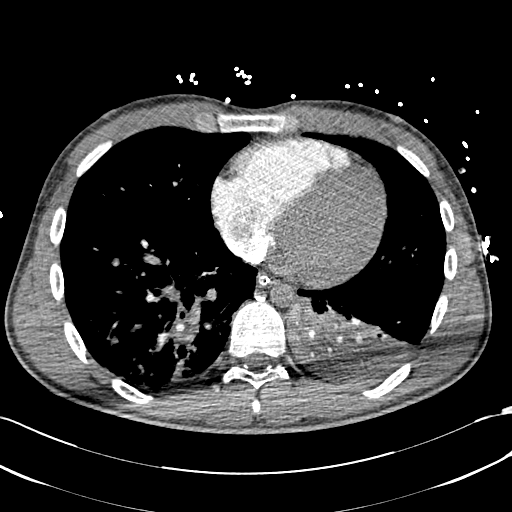
[im 118/301  lung]
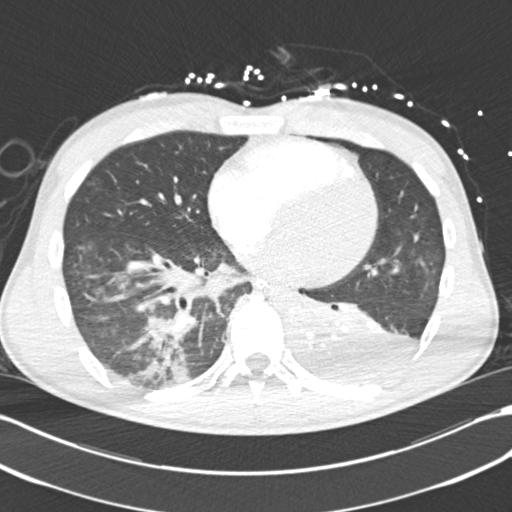
[im 144/301  soft-tissue]
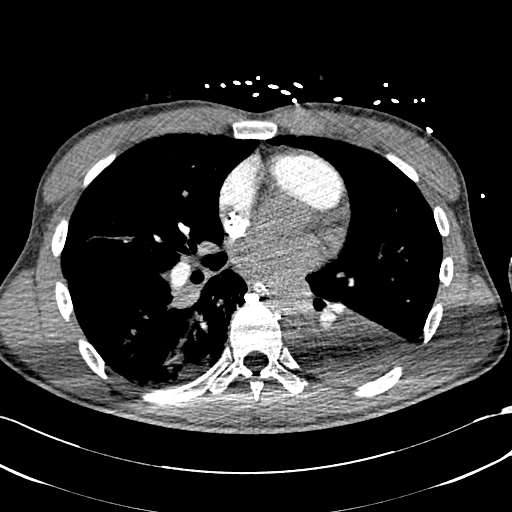
[im 157/301  lung]
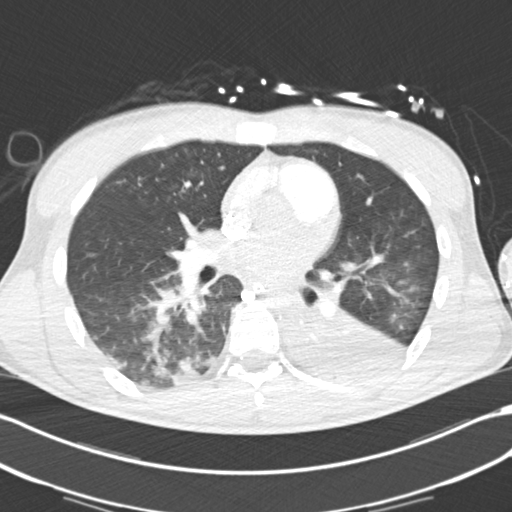
[im 183/301  soft-tissue]
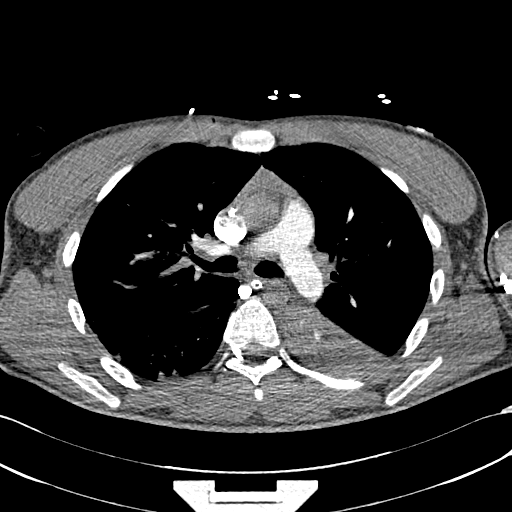
[im 196/301  lung]
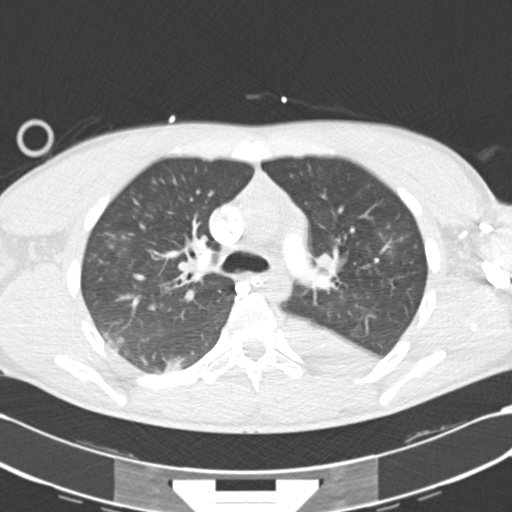
[im 209/301  soft-tissue]
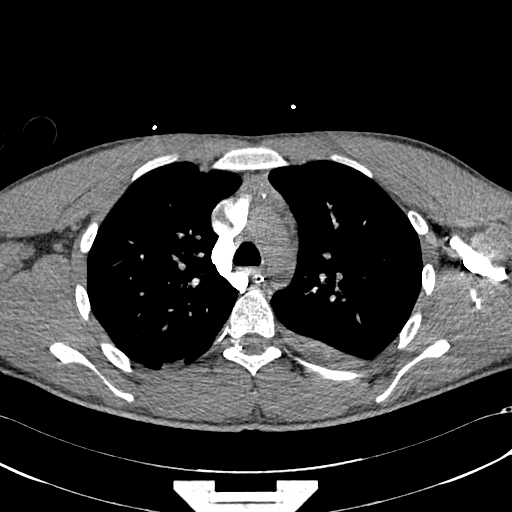
[im 235/301  lung]
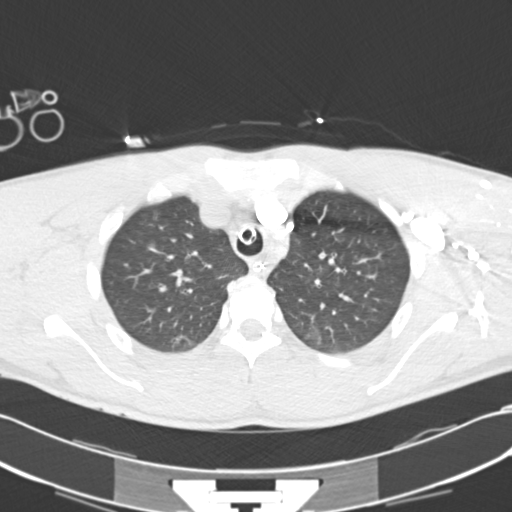
[im 248/301  soft-tissue]
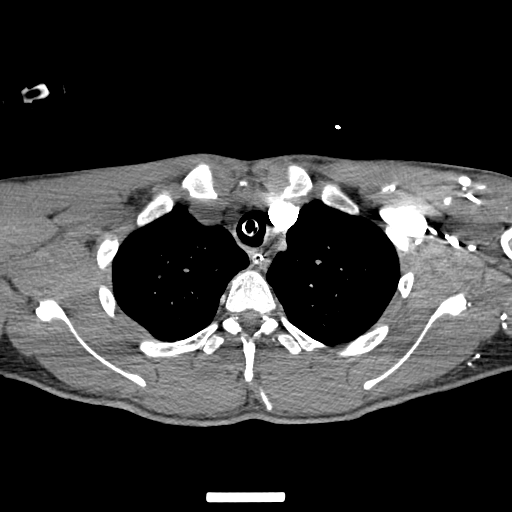
[im 261/301  lung]
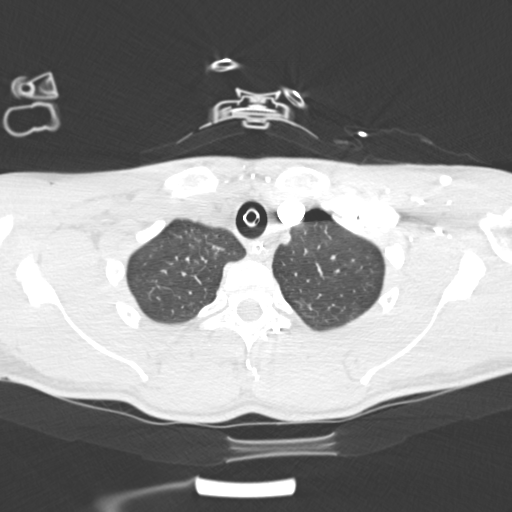
[im 287/301  soft-tissue]
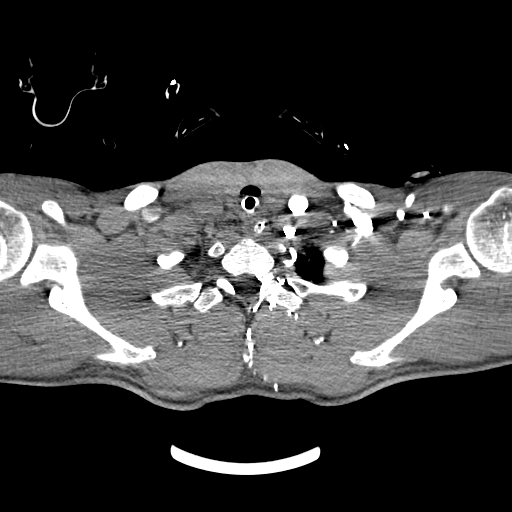

[Series 604: cor · coronal · 0.68mm/px · 3 of 98 slices shown]
[im 25/98  soft-tissue]
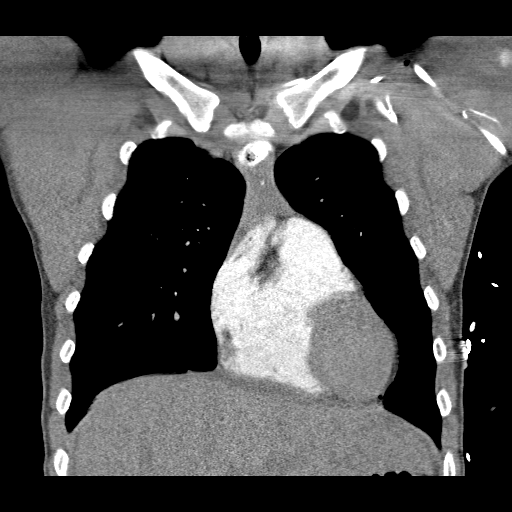
[im 49/98  soft-tissue]
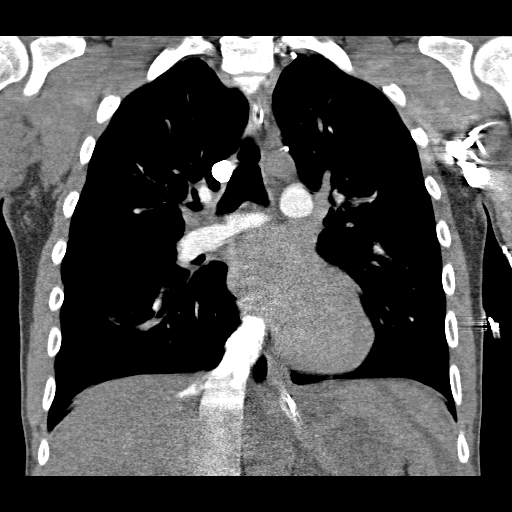
[im 73/98  soft-tissue]
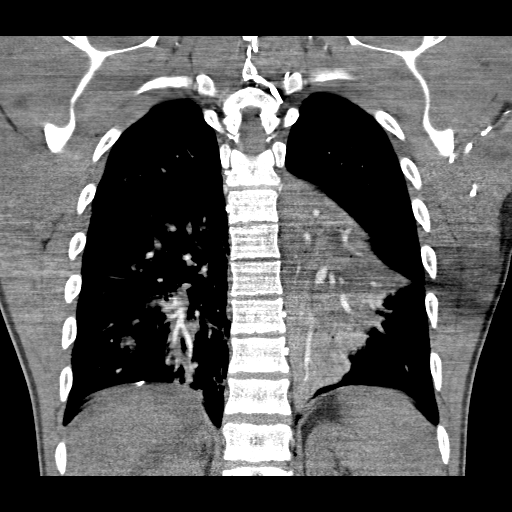

[19 of 46 positions shown; findings below may reference images not displayed]

FINDINGS: The pulmonary arteries are well opacified.  There is no
evidence for acute pulmonary embolus.  There is dense parenchymal
opacity at the left lung base, associated with air bronchograms.
Findings are consistent with consolidation, likely infectious or
related to aspiration.  Within the right lower lobe, there is a
tree in bud opacities, combined with more patchy, nodular density.
Findings are consistent with infectious process.  The heart size is
normal.  No evidence for pulmonary edema.  No mediastinal or hilar
adenopathy.  Endotracheal tube is in place, tip above carina.
Nasogastric tube is in place, tip at least to the stomach.

Images of the upper abdomen are unremarkable. Visualized osseous
structures have a normal appearance.

Review of the MIP images confirms the above findings.
IMPRESSION: 1.  Technically adequate exam showing no evidence for acute
pulmonary embolus.
2.  Dense consolidation of the left lower lobe.
3.  Infiltrate throughout the right lower lobe.  Findings are
consistent with infectious process, possibly related to aspiration.

## 2013-02-16 ENCOUNTER — Emergency Department (HOSPITAL_COMMUNITY)
Admission: EM | Admit: 2013-02-16 | Discharge: 2013-02-16 | Disposition: A | Discharge status: Rehabilitation Facility | Payer: Self-pay | Attending: Emergency Medicine | Admitting: Emergency Medicine

## 2013-02-16 ENCOUNTER — Encounter (HOSPITAL_COMMUNITY): Payer: Self-pay | Admitting: *Deleted

## 2013-02-16 DIAGNOSIS — F3289 Other specified depressive episodes: Secondary | ICD-10-CM | POA: Insufficient documentation

## 2013-02-16 DIAGNOSIS — R51 Headache: Secondary | ICD-10-CM | POA: Insufficient documentation

## 2013-02-16 DIAGNOSIS — Z0289 Encounter for other administrative examinations: Secondary | ICD-10-CM | POA: Insufficient documentation

## 2013-02-16 DIAGNOSIS — I252 Old myocardial infarction: Secondary | ICD-10-CM | POA: Insufficient documentation

## 2013-02-16 DIAGNOSIS — F1110 Opioid abuse, uncomplicated: Secondary | ICD-10-CM | POA: Insufficient documentation

## 2013-02-16 DIAGNOSIS — R5381 Other malaise: Secondary | ICD-10-CM | POA: Insufficient documentation

## 2013-02-16 DIAGNOSIS — R109 Unspecified abdominal pain: Secondary | ICD-10-CM | POA: Insufficient documentation

## 2013-02-16 DIAGNOSIS — Z8739 Personal history of other diseases of the musculoskeletal system and connective tissue: Secondary | ICD-10-CM | POA: Insufficient documentation

## 2013-02-16 DIAGNOSIS — F17200 Nicotine dependence, unspecified, uncomplicated: Secondary | ICD-10-CM | POA: Insufficient documentation

## 2013-02-16 DIAGNOSIS — Z79899 Other long term (current) drug therapy: Secondary | ICD-10-CM | POA: Insufficient documentation

## 2013-02-16 DIAGNOSIS — Z8709 Personal history of other diseases of the respiratory system: Secondary | ICD-10-CM | POA: Insufficient documentation

## 2013-02-16 DIAGNOSIS — Z8781 Personal history of (healed) traumatic fracture: Secondary | ICD-10-CM | POA: Insufficient documentation

## 2013-02-16 DIAGNOSIS — F329 Major depressive disorder, single episode, unspecified: Secondary | ICD-10-CM | POA: Insufficient documentation

## 2013-02-16 DIAGNOSIS — F1920 Other psychoactive substance dependence, uncomplicated: Secondary | ICD-10-CM

## 2013-02-16 DIAGNOSIS — IMO0001 Reserved for inherently not codable concepts without codable children: Secondary | ICD-10-CM | POA: Insufficient documentation

## 2013-02-16 DIAGNOSIS — M2550 Pain in unspecified joint: Secondary | ICD-10-CM | POA: Insufficient documentation

## 2013-02-16 DIAGNOSIS — F1120 Opioid dependence, uncomplicated: Secondary | ICD-10-CM

## 2013-02-16 DIAGNOSIS — F411 Generalized anxiety disorder: Secondary | ICD-10-CM | POA: Insufficient documentation

## 2013-02-16 DIAGNOSIS — G40909 Epilepsy, unspecified, not intractable, without status epilepticus: Secondary | ICD-10-CM | POA: Insufficient documentation

## 2013-02-16 DIAGNOSIS — F1410 Cocaine abuse, uncomplicated: Secondary | ICD-10-CM | POA: Insufficient documentation

## 2013-02-16 DIAGNOSIS — I10 Essential (primary) hypertension: Secondary | ICD-10-CM | POA: Insufficient documentation

## 2013-02-16 LAB — CBC
Hemoglobin: 17.2 g/dL — ABNORMAL HIGH (ref 13.0–17.0)
MCHC: 34.7 g/dL (ref 30.0–36.0)
RDW: 12.7 % (ref 11.5–15.5)
WBC: 14.7 10*3/uL — ABNORMAL HIGH (ref 4.0–10.5)

## 2013-02-16 LAB — RAPID URINE DRUG SCREEN, HOSP PERFORMED
Barbiturates: NOT DETECTED
Benzodiazepines: NOT DETECTED
Cocaine: POSITIVE — AB
Tetrahydrocannabinol: NOT DETECTED

## 2013-02-16 LAB — COMPREHENSIVE METABOLIC PANEL
ALT: 9 U/L (ref 0–53)
Albumin: 4.1 g/dL (ref 3.5–5.2)
Alkaline Phosphatase: 44 U/L (ref 39–117)
Glucose, Bld: 100 mg/dL — ABNORMAL HIGH (ref 70–99)
Potassium: 4.2 mEq/L (ref 3.5–5.1)
Sodium: 137 mEq/L (ref 135–145)
Total Protein: 8.3 g/dL (ref 6.0–8.3)

## 2013-02-16 LAB — ACETAMINOPHEN LEVEL: Acetaminophen (Tylenol), Serum: <15 ug/mL (ref 10–30)

## 2013-02-16 MED ORDER — LORAZEPAM 1 MG PO TABS: 1.0000 mg | ORAL_TABLET | Freq: Three times a day (TID) | ORAL | Status: DC | PRN

## 2013-02-16 MED ORDER — ZOLPIDEM TARTRATE 5 MG PO TABS: 5.0000 mg | ORAL_TABLET | Freq: Every evening | ORAL | Status: DC | PRN

## 2013-02-16 MED ORDER — NICOTINE 21 MG/24HR TD PT24
21.0000 mg | MEDICATED_PATCH | Freq: Every day | TRANSDERMAL | Status: DC
  Administered 2013-02-16: 21 mg via TRANSDERMAL
  Filled 2013-02-16: qty 1

## 2013-02-16 MED ORDER — IBUPROFEN 600 MG PO TABS: 600.0000 mg | ORAL_TABLET | Freq: Three times a day (TID) | ORAL | Status: DC | PRN

## 2013-02-16 MED ORDER — ACETAMINOPHEN 325 MG PO TABS: 650.0000 mg | ORAL_TABLET | ORAL | Status: DC | PRN

## 2013-02-16 MED ORDER — ONDANSETRON HCL 4 MG PO TABS: 4.0000 mg | ORAL_TABLET | Freq: Three times a day (TID) | ORAL | Status: DC | PRN

## 2013-02-16 MED ORDER — ALUM & MAG HYDROXIDE-SIMETH 200-200-20 MG/5ML PO SUSP: 30.0000 mL | ORAL | Status: DC | PRN

## 2013-02-16 NOTE — ED Notes (Signed)
Pt ambulated to [facility] ED, room 40, accompanied by this nurse with chart and personal belongings, condition stable at time of transfer.

## 2013-02-16 NOTE — ED Notes (Signed)
Patient discharge with mother with written and verbal instructions.  Bed confirmed by RTS worker. Respirations equal and unlabored. Skin warm and dry. No acute distress noted.

## 2013-02-16 NOTE — Consult Note (Signed)
Reason for Consult: Heroin and cocaine detox request Referring Physician: Bonnita Levan. Jonathan Mayers, MD   [name] [facility] is an 22 y.o. male.  HPI: Patient has a history of polysubstance abuse, overdose.  Patient presents requesting detox from heroin and cocaine.  Patient denies other drug use.  Patient states that he is not feeling well and has depression but is not SI/HI and denies AVH.    Past Medical History  Diagnosis Date  . Anxiety   . Depression   . Hypertension   . Pneumonia     aspiration  . Non-ST elevation MI (NSTEMI) 05/2011  . Seizures 2010  . Shock     History of  . Respiratory failure     History of  . Rhabdomyolysis     History of  . Overdose     unintentional  . History of hyperkalemia     Past Surgical History  Procedure Laterality Date  . Mandible fracture surgery      age 80    History reviewed. No pertinent family history.  Social History:  reports that he has been smoking Cigarettes.  He has a 9 pack-year smoking history. He has never used smokeless tobacco. He reports that he drinks about 0.6 ounces of alcohol per week. He reports that he uses illicit drugs ("Crack" cocaine, Heroin, Benzodiazepines, IV, and Cocaine).  Allergies:  Allergies  Allergen Reactions  . Seroquel (Quetiapine Fumerate)     "DOESN'T MAKE HIM FEEL GOOD"    Medications: I have reviewed the patient's current medications.  Results for orders placed during the hospital encounter of 02/16/13 (from the past 48 hour(s))  ACETAMINOPHEN LEVEL     Status: None   Collection Time    02/16/13 12:15 PM      Result Value Range   Acetaminophen (Tylenol), Serum <15.0  10 - 30 ug/mL   Comment:            THERAPEUTIC CONCENTRATIONS VARY     SIGNIFICANTLY. A RANGE OF 10-30     ug/mL MAY BE AN EFFECTIVE     CONCENTRATION FOR MANY PATIENTS.     HOWEVER, SOME ARE BEST TREATED     AT CONCENTRATIONS OUTSIDE THIS     RANGE.     ACETAMINOPHEN CONCENTRATIONS     >150 ug/mL AT 4 HOURS AFTER   INGESTION AND >50 ug/mL AT 12     HOURS AFTER INGESTION ARE     OFTEN ASSOCIATED WITH TOXIC     REACTIONS.  CBC     Status: Abnormal   Collection Time    02/16/13 12:15 PM      Result Value Range   WBC 14.7 (*) 4.0 - 10.5 K/uL   RBC 6.36 (*) 4.22 - 5.81 MIL/uL   Hemoglobin 17.2 (*) 13.0 - 17.0 g/dL   HCT 14.7  82.9 - 56.2 %   MCV 78.0  78.0 - 100.0 fL   MCH 27.0  26.0 - 34.0 pg   MCHC 34.7  30.0 - 36.0 g/dL   RDW 13.0  86.5 - 78.4 %   Platelets 350  150 - 400 K/uL  COMPREHENSIVE METABOLIC PANEL     Status: Abnormal   Collection Time    02/16/13 12:15 PM      Result Value Range   Sodium 137  135 - 145 mEq/L   Potassium 4.2  3.5 - 5.1 mEq/L   Chloride 100  96 - 112 mEq/L   CO2 23  19 - 32 mEq/L  Glucose, Bld 100 (*) 70 - 99 mg/dL   BUN 9  6 - 23 mg/dL   Creatinine, Ser 1.61  0.50 - 1.35 mg/dL   Calcium 09.6  8.4 - 04.5 mg/dL   Total Protein 8.3  6.0 - 8.3 g/dL   Albumin 4.1  3.5 - 5.2 g/dL   AST 14  0 - 37 U/L   ALT 9  0 - 53 U/L   Alkaline Phosphatase 44  39 - 117 U/L   Total Bilirubin 0.7  0.3 - 1.2 mg/dL   GFR calc non Af Amer >90  >90 mL/min   GFR calc Af Amer >90  >90 mL/min   Comment:            The eGFR has been calculated     using the CKD EPI equation.     This calculation has not been     validated in all clinical     situations.     eGFR's persistently     <90 mL/min signify     possible Chronic Kidney Disease.  ETHANOL     Status: None   Collection Time    02/16/13 12:15 PM      Result Value Range   Alcohol, Ethyl (B) <11  0 - 11 mg/dL   Comment:            LOWEST DETECTABLE LIMIT FOR     SERUM ALCOHOL IS 11 mg/dL     FOR MEDICAL PURPOSES ONLY  SALICYLATE LEVEL     Status: Abnormal   Collection Time    02/16/13 12:15 PM      Result Value Range   Salicylate Lvl <2.0 (*) 2.8 - 20.0 mg/dL  URINE RAPID DRUG SCREEN (HOSP PERFORMED)     Status: Abnormal   Collection Time    02/16/13 12:26 PM      Result Value Range   Opiates POSITIVE (*) NONE  DETECTED   Cocaine POSITIVE (*) NONE DETECTED   Benzodiazepines NONE DETECTED  NONE DETECTED   Amphetamines NONE DETECTED  NONE DETECTED   Tetrahydrocannabinol NONE DETECTED  NONE DETECTED   Barbiturates NONE DETECTED  NONE DETECTED   Comment:            DRUG SCREEN FOR MEDICAL PURPOSES     ONLY.  IF CONFIRMATION IS NEEDED     FOR ANY PURPOSE, NOTIFY LAB     WITHIN 5 DAYS.                LOWEST DETECTABLE LIMITS     FOR URINE DRUG SCREEN     Drug Class       Cutoff (ng/mL)     Amphetamine      1000     Barbiturate      200     Benzodiazepine   200     Tricyclics       300     Opiates          300     Cocaine          300     THC              50    No results found.  Review of Systems  Constitutional: Negative.   Eyes: Negative.   Respiratory: Negative.   Cardiovascular: Negative.   Gastrointestinal: Negative for nausea and abdominal pain.  Skin: Negative.   Neurological: Positive for headaches.  Endo/Heme/Allergies: Negative.   Psychiatric/Behavioral: Positive for depression  and substance abuse. Negative for suicidal ideas and hallucinations. The patient is nervous/anxious. The patient does not have insomnia.    Blood pressure 125/78, pulse 98, temperature 98 F (36.7 C), temperature source Oral, resp. rate 18, height 5\' 11"  (1.803 m), weight 71.668 kg (158 lb), SpO2 98.00%. Physical Exam  Constitutional: He is oriented to person, place, and time. He appears well-developed and well-nourished.  HENT:  Head: Normocephalic and atraumatic.  Eyes: Pupils are equal, round, and reactive to light.  Neck: Normal range of motion. Neck supple.  Cardiovascular: Normal rate, regular rhythm and normal heart sounds.   Respiratory: Effort normal and breath sounds normal.  GI: Soft. Bowel sounds are normal.  Musculoskeletal: Normal range of motion.  Neurological: He is alert and oriented to person, place, and time.  Skin: Skin is warm and dry.  Psychiatric: His speech is normal and  behavior is normal. He exhibits a depressed mood. He expresses no homicidal and no suicidal ideation.    Assessment/Plan: Recommendation for inpatient detox ACT/SW will located resources for patient  Doneen Ollinger B. Evoleth Nordmeyer FNP-BC Family Nurse Practitioner, Board Certified   Leah Thornberry 02/16/2013, 4:16 PM
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
## 2013-02-16 NOTE — ED Notes (Signed)
Patient mom allowed to wait with patient while awaiting acceptance to [facility] so she can transport patient. Patient and mom informed of all delays.

## 2013-02-16 NOTE — BH Assessment (Signed)
[facility] Assessment Progress Note      02/16/13.  2200. Pt accepted by Andrey Campanile at [facility].  [employer] auth # [id number].  Pt mother to transport pt tonight. Daleen Squibb, LCSWA

## 2013-02-16 NOTE — ED Notes (Signed)
PA at bedside.
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
## 2013-02-16 NOTE — ED Provider Notes (Signed)
Medical screening examination/treatment/procedure(s) were performed by non-physician practitioner and as supervising physician I was immediately available for consultation/collaboration.   Charles B. Bernette Mayers, MD 02/16/13 1444

## 2013-02-16 NOTE — ED Notes (Signed)
Pt from home requesting detox from heroin, cocaine, and benzos. Pt reports last use was yesterday at 1700. Pt denies SI/HI and AV hallucinations. Pt reports being voluntary and is noted to be anxious but cooperative during triage.

## 2013-02-16 NOTE — ED Notes (Signed)
[name], with Security in to wand pt and pt's personal belongings. Pt has 2 belonging bags which remain behind nurses' station.

## 2013-02-16 NOTE — ED Notes (Signed)
Security called to wand pt and pt's personal belongings.

## 2014-05-16 ENCOUNTER — Emergency Department (HOSPITAL_COMMUNITY): Payer: No Typology Code available for payment source

## 2014-05-16 ENCOUNTER — Encounter (HOSPITAL_COMMUNITY): Payer: Self-pay | Admitting: Emergency Medicine

## 2014-05-16 DIAGNOSIS — S59909A Unspecified injury of unspecified elbow, initial encounter: Secondary | ICD-10-CM | POA: Diagnosis not present

## 2014-05-16 DIAGNOSIS — Z8639 Personal history of other endocrine, nutritional and metabolic disease: Secondary | ICD-10-CM | POA: Insufficient documentation

## 2014-05-16 DIAGNOSIS — Z862 Personal history of diseases of the blood and blood-forming organs and certain disorders involving the immune mechanism: Secondary | ICD-10-CM | POA: Diagnosis not present

## 2014-05-16 DIAGNOSIS — F17200 Nicotine dependence, unspecified, uncomplicated: Secondary | ICD-10-CM | POA: Diagnosis not present

## 2014-05-16 DIAGNOSIS — I252 Old myocardial infarction: Secondary | ICD-10-CM | POA: Diagnosis not present

## 2014-05-16 DIAGNOSIS — Y9389 Activity, other specified: Secondary | ICD-10-CM | POA: Diagnosis not present

## 2014-05-16 DIAGNOSIS — Z79899 Other long term (current) drug therapy: Secondary | ICD-10-CM | POA: Insufficient documentation

## 2014-05-16 DIAGNOSIS — Z8709 Personal history of other diseases of the respiratory system: Secondary | ICD-10-CM | POA: Insufficient documentation

## 2014-05-16 DIAGNOSIS — S59919A Unspecified injury of unspecified forearm, initial encounter: Secondary | ICD-10-CM | POA: Diagnosis not present

## 2014-05-16 DIAGNOSIS — Z8701 Personal history of pneumonia (recurrent): Secondary | ICD-10-CM | POA: Diagnosis not present

## 2014-05-16 DIAGNOSIS — S99919A Unspecified injury of unspecified ankle, initial encounter: Secondary | ICD-10-CM | POA: Diagnosis present

## 2014-05-16 DIAGNOSIS — Z8659 Personal history of other mental and behavioral disorders: Secondary | ICD-10-CM | POA: Diagnosis not present

## 2014-05-16 DIAGNOSIS — S6990XA Unspecified injury of unspecified wrist, hand and finger(s), initial encounter: Secondary | ICD-10-CM

## 2014-05-16 DIAGNOSIS — S82009A Unspecified fracture of unspecified patella, initial encounter for closed fracture: Secondary | ICD-10-CM | POA: Insufficient documentation

## 2014-05-16 DIAGNOSIS — Y92410 Unspecified street and highway as the place of occurrence of the external cause: Secondary | ICD-10-CM | POA: Insufficient documentation

## 2014-05-16 DIAGNOSIS — S8990XA Unspecified injury of unspecified lower leg, initial encounter: Secondary | ICD-10-CM | POA: Diagnosis present

## 2014-05-16 DIAGNOSIS — Z8739 Personal history of other diseases of the musculoskeletal system and connective tissue: Secondary | ICD-10-CM | POA: Insufficient documentation

## 2014-05-16 DIAGNOSIS — I10 Essential (primary) hypertension: Secondary | ICD-10-CM | POA: Diagnosis not present

## 2014-05-16 DIAGNOSIS — S99929A Unspecified injury of unspecified foot, initial encounter: Secondary | ICD-10-CM

## 2014-05-16 MED ORDER — OXYCODONE-ACETAMINOPHEN 5-325 MG PO TABS: 1.0000 | ORAL_TABLET | Freq: Once | ORAL | Status: DC

## 2014-05-16 MED ORDER — IBUPROFEN 200 MG PO TABS
600.0000 mg | ORAL_TABLET | Freq: Once | ORAL | Status: AC
  Administered 2014-05-16: 600 mg via ORAL
  Filled 2014-05-16: qty 3
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
## 2014-05-17 ENCOUNTER — Emergency Department (HOSPITAL_COMMUNITY)
Admission: EM | Admit: 2014-05-17 | Discharge: 2014-05-17 | Disposition: A | Discharge status: Home / Self Care | Payer: No Typology Code available for payment source | Attending: Emergency Medicine | Admitting: Emergency Medicine

## 2014-05-17 DIAGNOSIS — S82001A Unspecified fracture of right patella, initial encounter for closed fracture: Secondary | ICD-10-CM

## 2014-05-17 DIAGNOSIS — T148XXA Other injury of unspecified body region, initial encounter: Secondary | ICD-10-CM

## 2014-05-17 MED ORDER — HYDROCODONE-ACETAMINOPHEN 5-325 MG PO TABS: 1.0000 | ORAL_TABLET | Freq: Four times a day (QID) | ORAL | Status: AC | PRN

## 2014-05-17 MED ORDER — IBUPROFEN 600 MG PO TABS: 600.0000 mg | ORAL_TABLET | Freq: Four times a day (QID) | ORAL | Status: AC | PRN

## 2014-05-17 MED ORDER — HYDROMORPHONE HCL PF 1 MG/ML IJ SOLN
1.0000 mg | Freq: Once | INTRAMUSCULAR | Status: AC
  Administered 2014-05-17: 1 mg via INTRAVENOUS
  Filled 2014-05-17: qty 1

## 2014-05-17 MED ORDER — FENTANYL CITRATE 0.05 MG/ML IJ SOLN
50.0000 ug | Freq: Once | INTRAMUSCULAR | Status: AC
  Administered 2014-05-17: 50 ug via INTRAVENOUS
  Filled 2014-05-17: qty 2

## 2014-05-17 MED ORDER — OXYCODONE-ACETAMINOPHEN 5-325 MG PO TABS
2.0000 | ORAL_TABLET | Freq: Once | ORAL | Status: AC
  Administered 2014-05-17: 2 via ORAL
  Filled 2014-05-17: qty 2

## 2014-05-17 NOTE — ED Notes (Signed)
Discharge and follow up instructions reviewed with pt and mother. Pt verbalized understanding.

## 2014-05-17 NOTE — Discharge Instructions (Signed)
Knee Fracture, Adult A knee fracture is a break in any of the bones of the lower part of the thigh bone, the upper part of the bones of the lower leg, or of the kneecap. When the bones no longer meet the way they are supposed to it is called a dislocation. Sometimes there can be a dislocation along with fractures. SYMPTOMS  Symptoms may include pain, swelling, inability to bend the knee, deformity of the knee, and inability to walk.  DIAGNOSIS  This problem is usually diagnosed with x-rays. Special studies are sometimes done if a fracture is suspected but cannot be seen on ordinary x-rays. If vessels around the knee are injured, special tests may be done to see what the damage is. TREATMENT   The leg is usually splinted for the first couple of days to allow for swelling. After the swelling is down a cast is put on. Sometimes a cast is put on right away with the sides of the cast cut to allow the knee to swell. If the bones are in place, this may be all that is needed.  If the bones are out of place, medications for pain are given to allow them to be put back in place. If they are seriously out of place, surgery may be needed to hold the pieces or breaks in place using wires, pins, screws or metal plates.  Generally most fractures will heal in 4 to 6 weeks. HOME CARE INSTRUCTIONS   Use your crutches as directed.  To lessen the swelling, keep the injured leg elevated while sitting or lying down.  Apply ice to the injury for 15-20 minutes, 03-04 times per day while awake for 2 days. Put the ice in a plastic bag and place a thin towel between the bag of ice and your cast.  If you have a plaster or fiberglass cast:  Do not try to scratch the skin under the cast using sharp or pointed objects.  Check the skin around the cast every day. You may put lotion on any red or sore areas.  Keep your cast dry and clean.  If you have a plaster splint:  Wear the splint as directed.  You may loosen the  elastic around the splint if your toes become numb, tingle, or turn cold or blue.  Do not put pressure on any part of your cast or splint; it may break. Rest your cast only on a pillow the first 24 hours until it is fully hardened.  Your cast or splint can be protected during bathing with a plastic bag. Do not lower the cast or splint into water.  Only take over-the-counter or prescription medicines for pain, discomfort, or fever as directed by your caregiver.  See your caregiver soon if your cast gets damaged or breaks.  It is very important to keep all follow up appointments. Not following up as directed may result in a worsening of your condition or a failure of the fracture to heal properly. SEEK IMMEDIATE MEDICAL CARE IF:  You have continued severe pain.  You have more swelling than you did before the cast was put on.  The area below the fracture becomes painful.  Your skin or toenails below the injury turn blue or gray, or feel cold or numb.  There is drainage coming from under the cast.  New, unexplained symptoms develop (drugs used in treatment may produce side effects). MAKE SURE YOU:   Understand these instructions.  Will watch your condition.  Will   get help right away if you are not doing well or get worse. Document Released: 07/29/2006 Document Revised: 12/08/2011 Document Reviewed: 08/30/2007 ExitCare Patient Information 2015 [employer]. This information is not intended to replace advice given to you by your health care provider. Make sure you discuss any questions you have with your health care provider.  
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
## 2014-05-22 ENCOUNTER — Ambulatory Visit (HOSPITAL_COMMUNITY): Payer: Medicaid Other | Attending: Orthopaedic Surgery

## 2014-05-22 ENCOUNTER — Other Ambulatory Visit (HOSPITAL_COMMUNITY): Payer: Self-pay | Admitting: Orthopaedic Surgery

## 2014-05-22 DIAGNOSIS — M7989 Other specified soft tissue disorders: Secondary | ICD-10-CM

## 2014-05-22 DIAGNOSIS — M25561 Pain in right knee: Secondary | ICD-10-CM

## 2014-05-24 ENCOUNTER — Other Ambulatory Visit (HOSPITAL_COMMUNITY): Payer: Self-pay | Admitting: Orthopaedic Surgery

## 2014-05-24 ENCOUNTER — Ambulatory Visit (HOSPITAL_COMMUNITY)
Admission: RE | Admit: 2014-05-24 | Discharge: 2014-05-24 | Disposition: A | Discharge status: Home / Self Care | Payer: No Typology Code available for payment source | Source: Ambulatory Visit | Attending: Orthopaedic Surgery | Admitting: Orthopaedic Surgery

## 2014-05-24 ENCOUNTER — Encounter (HOSPITAL_COMMUNITY): Payer: Medicaid Other

## 2014-05-24 DIAGNOSIS — M7989 Other specified soft tissue disorders: Secondary | ICD-10-CM

## 2014-05-24 DIAGNOSIS — M79609 Pain in unspecified limb: Secondary | ICD-10-CM | POA: Insufficient documentation

## 2014-05-24 DIAGNOSIS — M79604 Pain in right leg: Secondary | ICD-10-CM

## 2014-05-24 NOTE — Progress Notes (Signed)
VASCULAR LAB PRELIMINARY  PRELIMINARY  PRELIMINARY  PRELIMINARY  Right lower extremity venous duplex completed.    Preliminary report:  Right:  No evidence of DVT, superficial thrombosis, or Baker's cyst.  Kyrie Fludd, RVS 05/24/2014, 2:06 PM

## 2014-11-22 ENCOUNTER — Emergency Department (HOSPITAL_COMMUNITY): Payer: Self-pay

## 2014-11-22 ENCOUNTER — Emergency Department (HOSPITAL_COMMUNITY)
Admission: EM | Admit: 2014-11-22 | Discharge: 2014-11-22 | Disposition: A | Discharge status: Home / Self Care | Payer: Self-pay | Attending: Emergency Medicine | Admitting: Emergency Medicine

## 2014-11-22 ENCOUNTER — Encounter (HOSPITAL_COMMUNITY): Payer: Self-pay | Admitting: Emergency Medicine

## 2014-11-22 DIAGNOSIS — Z8639 Personal history of other endocrine, nutritional and metabolic disease: Secondary | ICD-10-CM | POA: Insufficient documentation

## 2014-11-22 DIAGNOSIS — Z8709 Personal history of other diseases of the respiratory system: Secondary | ICD-10-CM | POA: Insufficient documentation

## 2014-11-22 DIAGNOSIS — Z8669 Personal history of other diseases of the nervous system and sense organs: Secondary | ICD-10-CM | POA: Insufficient documentation

## 2014-11-22 DIAGNOSIS — Z8701 Personal history of pneumonia (recurrent): Secondary | ICD-10-CM | POA: Insufficient documentation

## 2014-11-22 DIAGNOSIS — M546 Pain in thoracic spine: Secondary | ICD-10-CM | POA: Insufficient documentation

## 2014-11-22 DIAGNOSIS — Z79899 Other long term (current) drug therapy: Secondary | ICD-10-CM | POA: Insufficient documentation

## 2014-11-22 DIAGNOSIS — Z8659 Personal history of other mental and behavioral disorders: Secondary | ICD-10-CM | POA: Insufficient documentation

## 2014-11-22 DIAGNOSIS — I10 Essential (primary) hypertension: Secondary | ICD-10-CM | POA: Insufficient documentation

## 2014-11-22 DIAGNOSIS — R0602 Shortness of breath: Secondary | ICD-10-CM | POA: Insufficient documentation

## 2014-11-22 DIAGNOSIS — Z8719 Personal history of other diseases of the digestive system: Secondary | ICD-10-CM | POA: Insufficient documentation

## 2014-11-22 DIAGNOSIS — Z87828 Personal history of other (healed) physical injury and trauma: Secondary | ICD-10-CM | POA: Insufficient documentation

## 2014-11-22 DIAGNOSIS — I252 Old myocardial infarction: Secondary | ICD-10-CM | POA: Insufficient documentation

## 2014-11-22 DIAGNOSIS — Z720 Tobacco use: Secondary | ICD-10-CM | POA: Insufficient documentation

## 2014-11-22 MED ORDER — KETOROLAC TROMETHAMINE 60 MG/2ML IM SOLN
60.0000 mg | Freq: Once | INTRAMUSCULAR | Status: AC
  Administered 2014-11-22: 60 mg via INTRAMUSCULAR
  Filled 2014-11-22: qty 2

## 2014-11-22 MED ORDER — METHOCARBAMOL 500 MG PO TABS: 500.0000 mg | ORAL_TABLET | Freq: Two times a day (BID) | ORAL | Status: AC

## 2014-11-22 MED ORDER — NAPROXEN 500 MG PO TABS: 500.0000 mg | ORAL_TABLET | Freq: Two times a day (BID) | ORAL | Status: AC

## 2014-11-22 NOTE — Discharge Instructions (Signed)
Back Pain, Adult °Low back pain is very common. About 1 in 5 people have back pain. The cause of low back pain is rarely dangerous. The pain often gets better over time. About half of people with a sudden onset of back pain feel better in just 2 weeks. About 8 in 10 people feel better by 6 weeks.  °CAUSES °Some common causes of back pain include: °· Strain of the muscles or ligaments supporting the spine. °· Wear and tear (degeneration) of the spinal discs. °· Arthritis. °· Direct injury to the back. °DIAGNOSIS °Most of the time, the direct cause of low back pain is not known. However, back pain can be treated effectively even when the exact cause of the pain is unknown. Answering your caregiver's questions about your overall health and symptoms is one of the most accurate ways to make sure the cause of your pain is not dangerous. If your caregiver needs more information, he or she may order lab work or imaging tests (X-rays or MRIs). However, even if imaging tests show changes in your back, this usually does not require surgery. °HOME CARE INSTRUCTIONS °For many people, back pain returns. Since low back pain is rarely dangerous, it is often a condition that people can learn to manage on their own.  °· Remain active. It is stressful on the back to sit or stand in one place. Do not sit, drive, or stand in one place for more than 30 minutes at a time. Take short walks on level surfaces as soon as pain allows. Try to increase the length of time you walk each day. °· Do not stay in bed. Resting more than 1 or 2 days can delay your recovery. °· Do not avoid exercise or work. Your body is made to move. It is not dangerous to be active, even though your back may hurt. Your back will likely heal faster if you return to being active before your pain is gone. °· Pay attention to your body when you  bend and lift. Many people have less discomfort when lifting if they bend their knees, keep the load close to their bodies, and  avoid twisting. Often, the most comfortable positions are those that put less stress on your recovering back. °· Find a comfortable position to sleep. Use a firm mattress and lie on your side with your knees slightly bent. If you lie on your back, put a pillow under your knees. °· Only take over-the-counter or prescription medicines as directed by your caregiver. Over-the-counter medicines to reduce pain and inflammation are often the most helpful. Your caregiver may prescribe muscle relaxant drugs. These medicines help dull your pain so you can more quickly return to your normal activities and healthy exercise. °· Put ice on the injured area. °¨ Put ice in a plastic bag. °¨ Place a towel between your skin and the bag. °¨ Leave the ice on for 15-20 minutes, 03-04 times a day for the first 2 to 3 days. After that, ice and heat may be alternated to reduce pain and spasms. °· Ask your caregiver about trying back exercises and gentle massage. This may be of some benefit. °· Avoid feeling anxious or stressed. Stress increases muscle tension and can worsen back pain. It is important to recognize when you are anxious or stressed and learn ways to manage it. Exercise is a great option. °SEEK MEDICAL CARE IF: °· You have pain that is not relieved with rest or medicine. °· You have pain that does not improve in 1 week. °· You have new symptoms. °· You are generally not feeling well. °SEEK   IMMEDIATE MEDICAL CARE IF:   You have pain that radiates from your back into your legs.  You develop new bowel or bladder control problems.  You have unusual weakness or numbness in your arms or legs.  You develop nausea or vomiting.  You develop abdominal pain.  You feel faint. Document Released: 09/15/2005 Document Revised: 03/16/2012 Document Reviewed: 01/17/2014 [facility] Patient Information 2015 [place]. This information is not intended to replace advice given to you by your health care provider. Make sure you  discuss any questions you have with your health care provider.  It is important for you to take your medications as prescribed to help with your back pain. It is also important for you to return for evaluation if you begin to experience fevers, chills, night sweats, numbness or weakness in any of your extremities. Please follow-up with your PCP, [facility] in order to establish care. Return to ED for new or worsening symptoms.

## 2014-11-22 NOTE — ED Provider Notes (Signed)
CSN: [id number]     Arrival date & time 11/22/14  0935 History   First MD Initiated Contact with Patient 11/22/14 1002     Chief Complaint  Patient presents with  . Back Pain  . Shortness of Breath     (Consider location/radiation/quality/duration/timing/severity/associated sxs/prior Treatment) HPI [name] is a 24 y.o. male with a history of chronic heroin abuse comes in for evaluation of sudden onset back pain. Patient states yesterday afternoon he was sitting in his truck when he experienced a sudden onset "twisting sensation in the middle of my back that the breath out of me". He reports this sensation lasted approximately 30 minutes and then resolved on its own. Patient states the same occurred again this morning at 6 AM, but this time has not resolved. He does report that the discomfort has improved since being in the ED. He does report associated shortness of breath when the pain hits. He has not tried anything to improve his symptoms. He denies any other fevers, chronic steroid use, personal history of cancer, numbness or weakness, denies saddle paresthesias, loss of bowel or bladder function.  Past Medical History  Diagnosis Date  . Anxiety   . Depression   . Hypertension   . Pneumonia     aspiration  . Non-ST elevation MI (NSTEMI) 05/2011  . Seizures 2010  . Shock     History of  . Respiratory failure     History of  . Rhabdomyolysis     History of  . Overdose     unintentional  . History of hyperkalemia    Past Surgical History  Procedure Laterality Date  . Mandible fracture surgery      age 71   No family history on file. History  Substance Use Topics  . Smoking status: Current Every Day Smoker -- 1.00 packs/day for 9 years    Types: Cigarettes  . Smokeless tobacco: Never Used  . Alcohol Use: 0.6 oz/week    1 Cans of beer per week    Review of Systems  Constitutional: Negative for fever and chills.  Respiratory: Positive for shortness of breath.    Cardiovascular: Negative for chest pain.  Musculoskeletal: Positive for back pain. Negative for neck pain and neck stiffness.  Neurological: Negative for weakness and numbness.      Allergies  Seroquel  Home Medications   Prior to Admission medications   Medication Sig Start Date End Date Taking? Authorizing Provider  HYDROcodone-acetaminophen (NORCO/VICODIN) 5-325 MG per tablet Take 1 tablet by mouth every 6 (six) hours as needed. 05/17/14   Derwood Kaplan, MD  ibuprofen (ADVIL,MOTRIN) 600 MG tablet Take 1 tablet (600 mg total) by mouth every 6 (six) hours as needed. 05/17/14   Derwood Kaplan, MD  methadone (DOLOPHINE) 10 MG/ML solution Take 80 mg by mouth daily.    Historical Provider, MD  methocarbamol (ROBAXIN) 500 MG tablet Take 1 tablet (500 mg total) by mouth 2 (two) times daily. 11/22/14   Earle Gell Charnee Turnipseed, PA-C  naproxen (NAPROSYN) 500 MG tablet Take 1 tablet (500 mg total) by mouth 2 (two) times daily. 11/22/14   Earle Gell Tennyson Wacha, PA-C   BP 113/66 mmHg  Pulse 71  Temp(Src) 98.7 F (37.1 C) (Oral)  Resp 16  SpO2 97% Physical Exam  Constitutional:  Awake, alert, nontoxic appearance with baseline speech.  HENT:  Head: Atraumatic.  Eyes: Pupils are equal, round, and reactive to light. Right eye exhibits no discharge. Left eye exhibits no discharge.  Neck: Neck supple.  Cardiovascular: Normal rate and regular rhythm.   No murmur heard. Pulmonary/Chest: Effort normal and breath sounds normal. No respiratory distress. He has no wheezes. He has no rales. He exhibits no tenderness.  Abdominal: Soft. Bowel sounds are normal. He exhibits no mass. There is no tenderness. There is no rebound.  Musculoskeletal:       Thoracic back: He exhibits no tenderness.       Lumbar back: He exhibits no tenderness.  Diffuse tenderness throughout the paraspinal thoracic muscles. Minimal tenderness to midline thoracic spine without focal tenderness. No obvious lesions, step-offs or crepitus.  No erythema or warmth. Maintains full active range of motion of cervical, thoracic and lumbar spine  Neurological:  Reflex Scores:      Patellar reflexes are 2+ on the right side and 2+ on the left side. Mental status baseline for patient.  Motor and sensation 5/5 in all 4 extremities. Gait is baseline without ataxia.  Skin: No rash noted.  Psychiatric: He has a normal mood and affect.  Nursing note and vitals reviewed.   ED Course  Procedures (including critical care time) Labs Review Labs Reviewed - No data to display  Imaging Review Dg Chest 2 View (if Patient Has Fever And/or Copd)  11/22/2014   CLINICAL DATA:  24 year old male with mid back pain since yesterday and shortness of Breath after twisting injury or. Initial encounter.  EXAM: CHEST  2 VIEW  COMPARISON:  09/02/2011 and earlier.  FINDINGS: Normal cardiac size and mediastinal contours. Visualized tracheal air column is within normal limits. Lung volumes are stable and within normal limits. The lungs are clear. No pneumothorax or effusion. No osseous abnormality identified.  IMPRESSION: Negative, no acute cardiopulmonary abnormality.   Electronically Signed   By: Odessa FlemingH  Hall M.D.   On: 11/22/2014 10:10     EKG Interpretation None     Meds given in ED:  Medications  ketorolac (TORADOL) injection 60 mg (60 mg Intramuscular Given 11/22/14 1036)    Discharge Medication List as of 11/22/2014 10:52 AM    START taking these medications   Details  methocarbamol (ROBAXIN) 500 MG tablet Take 1 tablet (500 mg total) by mouth 2 (two) times daily., Starting 11/22/2014, Until Discontinued, Print    naproxen (NAPROSYN) 500 MG tablet Take 1 tablet (500 mg total) by mouth 2 (two) times daily., Starting 11/22/2014, Until Discontinued, Print       Filed Vitals:   11/22/14 0945 11/22/14 1109  BP: 133/83 113/66  Pulse: 68 71  Temp: 98.7 F (37.1 C)   TempSrc: Oral   Resp: 20 16  SpO2: 100% 97%    MDM  Vitals stable - WNL  -afebrile Pt resting comfortably in ED. PE--normal neuro exam. Normal lung exam. not concerning for acute or emergent process. Imaging-chest x-ray shows no acute cardiopulmonary pathology.  DDX--low concern for epidural abscess at this time. Patient exhibits no systemic symptoms of infection. Normal neuro exam. No evidence of cauda equina or conus medullaris syndrome. Patient likely suffering from musculoskeletal pain. Discussed return for prompt evaluation if he begins to experience fevers, chills, night sweats, numbness or weakness, loss of bowel or bladder functions. Shortness of breath likely pain related and does not appear related to any acute or emergent process at this time.  Will DC with NSAIDs, muscle relaxers. Discussed will not be able to drive or operate machinery while using muscle relaxers. I discussed all relevant lab findings and imaging results with pt and they verbalized  understanding. Discussed f/u with PCP within 48 hrs and return precautions, pt very amenable to plan. Patient stable, in good condition and is appropriate for discharge. Ambulates out of the ED without difficulty. Prior to patient discharge, I discussed and reviewed this case with Dr. Littie Deeds    Final diagnoses:  Bilateral thoracic back pain        Sharlene Motts, PA-C 11/22/14 1118  Mirian Mo, MD 11/23/14 [phone]

## 2014-11-22 NOTE — ED Notes (Signed)
Pt reporting mid to low back pain since yesterday after twisting and states "it sucks all the air out of me". Lungs sound clear.

## 2018-10-12 ENCOUNTER — Encounter (INDEPENDENT_AMBULATORY_CARE_PROVIDER_SITE_OTHER): Payer: Self-pay | Admitting: Adult Health

## 2018-10-12 ENCOUNTER — Ambulatory Visit (INDEPENDENT_AMBULATORY_CARE_PROVIDER_SITE_OTHER): Payer: 59 | Admitting: Adult Health

## 2018-10-12 VITALS — BP 130/80 | HR 71 | Temp 98.4°F | Resp 16 | Ht 72.0 in | Wt 222.8 lb

## 2018-10-12 DIAGNOSIS — L309 Dermatitis, unspecified: Secondary | ICD-10-CM

## 2018-10-12 DIAGNOSIS — F32A Depression, unspecified: Secondary | ICD-10-CM

## 2018-10-12 DIAGNOSIS — F320 Major depressive disorder, single episode, mild: Secondary | ICD-10-CM

## 2018-10-12 DIAGNOSIS — R0681 Apnea, not elsewhere classified: Secondary | ICD-10-CM

## 2018-10-12 DIAGNOSIS — G4700 Insomnia, unspecified: Secondary | ICD-10-CM

## 2018-10-12 MED ORDER — TRIAMCINOLONE ACETONIDE 0.025 % EX CREA
TOPICAL_CREAM | CUTANEOUS | 1 refills | Status: AC
Start: 2018-10-12 — End: 2019-10-12

## 2018-10-12 MED ORDER — BUPROPION HCL ER (XL) 150 MG PO TB24
150.00 mg | ORAL_TABLET | Freq: Every day | ORAL | 1 refills | Status: DC
Start: 2018-10-12 — End: 2018-12-02

## 2018-10-12 NOTE — Progress Notes (Signed)
Subjective:    Patient ID: Clayton Davis is a 28 y.o. male.    HPI   Here today for depression and insomnia.    Depression: Has had in the past. Was dx with depression when he was 28 y/o. Isolating, decreased motivation, moodiness were his symptoms. Feels many of these symptoms resurfacing and feeling anxious, excessive worry with depression s/sx. Admits to affecting his sleep. He states he has a "wonderful life" and is frustrated the depression is affecting him again. Denies SI/HI. He also notes his sex drive is diminished and has no desire.    Insomnia: Difficulty falling asleep - "mind racing and worrying". Once he is able to fall asleep, fiance notes him gasping for air 4-5 times a night. He does not feel rested in am d/t this. She states he snores at night as well.     Eczema: Winter months or when working outside. Notes rash on dorsal aspect of hands. Mild itchiness, has tried lotion to no avail.     The following portions of the patient's history were reviewed and updated as appropriate: allergies, current medications, past family history, past medical history, past social history, past surgical history and problem list.    Review of Systems   Constitutional: Positive for fatigue. Negative for appetite change.   HENT: Negative for congestion, facial swelling, postnasal drip, rhinorrhea and sinus pain.    Eyes: Negative for discharge.   Respiratory: Positive for apnea. Negative for cough, chest tightness and shortness of breath.    Cardiovascular: Negative for chest pain and palpitations.   Gastrointestinal: Negative for abdominal distention, constipation and diarrhea.   Endocrine: Positive for heat intolerance. Negative for cold intolerance, polydipsia, polyphagia and polyuria.   Genitourinary: Negative for difficulty urinating, discharge and genital sores.   Musculoskeletal: Negative for arthralgias and joint swelling.   Skin: Negative for color change.   Neurological: Negative for dizziness, seizures and  headaches.   Psychiatric/Behavioral: Positive for sleep disturbance. Negative for suicidal ideas. The patient is nervous/anxious.            Objective:    Physical Exam  Constitutional:       Appearance: Normal appearance. He is normal weight.   HENT:      Head: Normocephalic and atraumatic.      Nose:      Comments: Deviated septum   Eyes:      Extraocular Movements: Extraocular movements intact.      Pupils: Pupils are equal, round, and reactive to light.   Neck:      Musculoskeletal: Normal range of motion.   Cardiovascular:      Rate and Rhythm: Normal rate and regular rhythm.      Pulses: Normal pulses.      Heart sounds: Normal heart sounds.   Pulmonary:      Effort: Pulmonary effort is normal.   Abdominal:      General: Abdomen is flat.   Musculoskeletal: Normal range of motion.   Skin:     General: Skin is warm and dry.      Capillary Refill: Capillary refill takes less than 2 seconds.      Comments: Right knuckles lesion 1 cm erythematous, crusting and scaling    Neurological:      Mental Status: He is alert.             Assessment:       1. Fatigue due to depression    2. 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 Apnea    5. Eczema of both hands          Plan:       1. Start with Wellbutrin - less side effects for decreased sexual drive and can help with energy level. May continue to affect sleep. We will readdress at your next appointment in one month.   2. ENT referral for snoring and apnea. May need sleep study.   3. Use Triamcinolone as directed. Apply Eucerine two times daily as well.

## 2018-10-29 LAB — CBC AND DIFFERENTIAL
Baso(Absolute): 0.1 10*3/uL (ref 0.0–0.2)
Basos: 1 %
Eos: 4 %
Eosinophils Absolute: 0.4 10*3/uL (ref 0.0–0.4)
Hematocrit: 40.8 % (ref 37.5–51.0)
Hemoglobin: 13.6 g/dL (ref 13.0–17.7)
Immature Granulocytes Absolute: 0.1 10*3/uL (ref 0.0–0.1)
Immature Granulocytes: 1 %
Lymphocytes Absolute: 2.5 10*3/uL (ref 0.7–3.1)
Lymphocytes: 26 %
MCH: 25.9 pg — ABNORMAL LOW (ref 26.6–33.0)
MCHC: 33.3 g/dL (ref 31.5–35.7)
MCV: 78 fL — ABNORMAL LOW (ref 79–97)
Monocytes Absolute: 0.9 10*3/uL (ref 0.1–0.9)
Monocytes: 10 %
Neutrophils Absolute: 5.6 10*3/uL (ref 1.4–7.0)
Neutrophils: 58 %
Platelets: 358 10*3/uL (ref 150–450)
RBC: 5.25 x10E6/uL (ref 4.14–5.80)
RDW: 13.1 % (ref 11.6–15.4)
WBC: 9.5 10*3/uL (ref 3.4–10.8)

## 2018-10-29 LAB — COMPREHENSIVE METABOLIC PANEL
ALT: 16 IU/L (ref 0–44)
AST (SGOT): 14 IU/L (ref 0–40)
Albumin/Globulin Ratio: 1.7 (ref 1.2–2.2)
Albumin: 4.7 g/dL (ref 4.1–5.2)
Alkaline Phosphatase: 51 IU/L (ref 39–117)
BUN / Creatinine Ratio: 21 — ABNORMAL HIGH (ref 9–20)
BUN: 20 mg/dL (ref 6–20)
Bilirubin, Total: 0.6 mg/dL (ref 0.0–1.2)
CO2: 21 mmol/L (ref 20–29)
Calcium: 9.7 mg/dL (ref 8.7–10.2)
Chloride: 100 mmol/L (ref 96–106)
Creatinine: 0.96 mg/dL (ref 0.76–1.27)
EGFR: 107 mL/min/{1.73_m2} (ref >59)
EGFR: 124 mL/min/{1.73_m2} (ref >59)
Globulin, Total: 2.8 g/dL (ref 1.5–4.5)
Glucose: 105 mg/dL — ABNORMAL HIGH (ref 65–99)
Potassium: 4.4 mmol/L (ref 3.5–5.2)
Protein, Total: 7.5 g/dL (ref 6.0–8.5)
Sodium: 139 mmol/L (ref 134–144)

## 2018-10-29 LAB — TSH WITH REFLEX TO FREE T4: TSH: 4.11 u[IU]/mL (ref 0.450–4.500)

## 2018-11-04 ENCOUNTER — Other Ambulatory Visit
Admission: RE | Admit: 2018-11-04 | Discharge: 2018-11-04 | Disposition: A | Discharge status: Home / Self Care | Payer: 59 | Source: Ambulatory Visit | Attending: Physician Assistant | Admitting: Physician Assistant

## 2018-11-04 ENCOUNTER — Ambulatory Visit (INDEPENDENT_AMBULATORY_CARE_PROVIDER_SITE_OTHER): Payer: 59 | Admitting: Physician Assistant

## 2018-11-04 ENCOUNTER — Encounter (INDEPENDENT_AMBULATORY_CARE_PROVIDER_SITE_OTHER): Payer: Self-pay | Admitting: Physician Assistant

## 2018-11-04 VITALS — BP 146/88 | HR 79 | Temp 98.2°F | Resp 18 | Ht 71.0 in | Wt 222.0 lb

## 2018-11-04 DIAGNOSIS — J029 Acute pharyngitis, unspecified: Secondary | ICD-10-CM

## 2018-11-04 LAB — POCT RAPID STREP A: Rapid Strep A Screen POCT: NEGATIVE

## 2018-11-04 MED ORDER — VH MAGIC MOUTHWASH ORAL SUSP (W/O NYSTATIN)
15.00 mL | Freq: Four times a day (QID) | ORAL | 0 refills | Status: DC | PRN
Start: 2018-11-04 — End: 2021-05-26

## 2018-11-04 NOTE — Progress Notes (Signed)
Subjective:    Patient ID: Clayton Davis is a 28 y.o. male.    Patient admits to having a sore throat the past week. He said he lost his voice during the week and just got it back. He said sore throat worse at night, very painful to swallow and talk. No known fevers, chills, or body aches.    Sore Throat    This is a new problem. The current episode started in the past 7 days. The problem has been gradually worsening. Neither side of throat is experiencing more pain than the other. There has been no fever. The pain is at a severity of 7/10. The pain is moderate. Associated symptoms include a hoarse voice and swollen glands. Pertinent negatives include no congestion, coughing, diarrhea, drooling, ear discharge, ear pain, headaches, plugged ear sensation, neck pain, shortness of breath, stridor, trouble swallowing or vomiting. He has tried gargles (throat losenges) for the symptoms.       The following portions of the patient's history were reviewed and updated as appropriate: allergies, current medications, past family history, past medical history, past social history, past surgical history and problem list.    Review of Systems   Constitutional: Negative.  Negative for chills and fever.   HENT: Positive for hoarse voice, sore throat and voice change. Negative for congestion, drooling, ear discharge, ear pain, mouth sores, postnasal drip, sinus pressure, sinus pain and trouble swallowing.    Respiratory: Negative.  Negative for cough, shortness of breath and stridor.    Cardiovascular: Negative.    Gastrointestinal: Negative for diarrhea and vomiting.   Musculoskeletal: Negative for neck pain.   Neurological: Negative for headaches.         Objective:    BP 146/88    Pulse 79    Temp 98.2 F (36.8 C) (Oral)    Resp 18    Ht 1.803 m (5\' 11" )    Wt 100.7 kg (222 lb)    BMI 30.96 kg/m     Physical Exam  Vitals signs and nursing note reviewed.   Constitutional:       General: He is not in acute distress.      Appearance: Normal appearance. He is not ill-appearing, toxic-appearing or diaphoretic.   HENT:      Right Ear: Tympanic membrane normal.      Left Ear: Tympanic membrane normal.      Nose: Nose normal.      Mouth/Throat:      Lips: Pink.      Mouth: Mucous membranes are moist.      Pharynx: Oropharynx is clear. Uvula midline. Posterior oropharyngeal erythema present. No pharyngeal swelling, oropharyngeal exudate or uvula swelling.      Tonsils: No tonsillar exudate or tonsillar abscesses. 2+ on the right. 2+ on the left.   Neck:      Musculoskeletal: Normal range of motion and neck supple. Muscular tenderness (anterior cervical bilateral) present. No neck rigidity.   Cardiovascular:      Rate and Rhythm: Normal rate and regular rhythm.   Pulmonary:      Effort: Pulmonary effort is normal. No respiratory distress.      Breath sounds: Normal breath sounds. No stridor. No wheezing, rhonchi or rales.   Lymphadenopathy:      Cervical: No cervical adenopathy.   Skin:     General: Skin is warm and dry.   Neurological:      General: No focal deficit present.      Mental  Status: He is alert and oriented to person, place, and time.   Psychiatric:         Mood and Affect: Mood normal.         Behavior: Behavior normal.           Assessment and Plan:       Lab Results from today's visit:  Recent Results (from the past 4 hour(s))   POCT Rapid Group A Strep    Collection Time: 11/04/18  1:53 PM   Result Value Ref Range    POCT QC Pass     Rapid Strep A Screen POCT Negative  Negative    Comment       Negative Results should be confirmed by throat Cx to confirm absence of Strep A inf.       Radiology Results from today's visit:  No results found.    Assessment/Plan for today's visit:  1. Sore throat  POCT Rapid Group A Strep    Magic Mouthwash (w/o nystatin)    [facility] ONLY - Throat Culture       Leanord Asal, [place]  [facility]  11/04/2018 2:31 PM        Patient Instructions     Pharyngitis (Sore Throat), Report  Pending    Pharyngitis (sore throat) is often due to a virus. It can also be caused by streptococcus (strep), bacteria. This is often called strep throat. Both viral and strep infectionscan cause throat pain that is worse when swallowing, aching all over, headache,and fever. Both types of infections are contagious. They may be spread by coughing, kissing,or touching others after touching your mouth or nose.  A test has been done to find out ifyou or your child have strep throat. Call this facility or your healthcare provider if you were not given your test results. If the test ispositivefor strep infection, you will need to takeantibiotic medicines. A prescription can be called into your pharmacy at that time. If the test isnegative,you probably have a viral pharyngitis. Thisdoes notneed to be treated with antibiotics.Until you receive the results of the strep test, you should stay home from work. If your child is being tested, he or she should stay home from school.  Home care   Rest at home. Drink plenty of fluids so you won't get dehydrated.   If the test is positive for strep, you or your child should not go towork or school for the first 2 days of taking the antibiotics. After this time, you or your child will not be contagious. You or your child can then return to work or school when feeling better.   Use the antibiotic medicinefor the full 10 days. Do not stop the medicine even if you or your child feel better. This is very important to make sure the infection is fully treated. It is also important to prevent medicine-resistant germs from growing.If you or your child were given an antibiotic shot, nomore antibiotics are needed.   Use throat lozenges or numbing throat sprays to help reduce pain. Gargling with warm salt water will also help reduce throat pain. Dissolve 1/2 teaspoon of salt in 1 glass of warm water. Children can sip on juice or a popsicle. Children 5 years and older can also  suck on a lollipop or hard candy.   Don't eat salty or spicy foods or give them to your child. These can irritate the throat.  Other medicine for a child: You can give your child  acetaminophen for fever, fussiness, or discomfort. In babies over 35 months of age, you may use ibuprofen instead of acetaminophen. If your child has chronic liver or kidney disease or ever had a stomach ulcer or GI bleeding, talk with your childs healthcare provider before giving these medicines. Aspirin should never be used by any child under 54 years of age who has a fever. It may cause severe liver damage.  Other medicine for an adult: You may use acetaminophen or ibuprofen to control pain or fever, unless another medicine was prescribed for this. If you have chronic liver or kidney disease or ever had a stomach ulcer or GI bleeding, talk with your healthcare provider before using these medicines.  Follow-up care  Follow up with your healthcare provider or our staff if you or your child don't get better over the next week.  When to seek medical advice  Call your healthcare provider right away if any of these occur:   Feveras directed by your healthcare provider. For children, seek care if:  ? Your child is of any age and has repeated fevers above 1010F (40C).  ? Your child is younger than 4 years of age and has a fever of 100.10F (38C) for more than 1 day.  ? Your child is 58 years old or older and has a fever of 100.10F (38C) for more than 3 days.   New or worsening ear pain, sinus pain, or headache   Painful lumps in the back of neck   Stiff neck   Lymph nodes are getting larger   Cant swallow liquids, a lot of drooling, or cant open mouth wide due to throat pain   Signs of dehydration, such as very dark urine or no urine, sunken eyes, dizziness   Trouble breathing or noisy breathing   Muffled voice   New rash   Other symptoms getting worse  Prevention  Here are steps you can take to help prevent an infection:    Keep good hand washing habits.   Dont have close contact with people who have sore throats, colds, or other upper respiratory infections.   Dont smoke, and stay away from secondhand smoke.   Stay up to date with of your vaccines.  [employer] last reviewed this educational content on 07/30/2016   2000-2019 [employer]. [address] [place], [place] [address]. All rights reserved. This information is not intended as a substitute for professional medical care. Always follow your healthcare professional's instructions.          THROAT CULTURE PENDING, CAN CONTINUE WITH SYMPTOMATIC RELIEF AT THIS TIME, MAGIC MOUTH WASH PRN FOR SORE THROAT. CAN CALL WITH QUESTIONS, NEW OR WORRISOME SYMPTOMS OF CONCERN PLEASE PROCEED TO ER.              Leanord Asal, [place]  [facility]  11/04/2018  2:31 PM

## 2018-11-04 NOTE — Patient Instructions (Signed)
Pharyngitis (Sore Throat), Report Pending    Pharyngitis (sore throat) is often due to a virus. It can also be caused by streptococcus (strep), bacteria. This is often called strep throat. Both viral and strep infectionscan cause throat pain that is worse when swallowing, aching all over, headache,and fever. Both types of infections are contagious. They may be spread by coughing, kissing,or touching others after touching your mouth or nose.  A test has been done to find out ifyou or your child have strep throat. Call this facility or your healthcare provider if you were not given your test results. If the test ispositivefor strep infection, you will need to takeantibiotic medicines. A prescription can be called into your pharmacy at that time. If the test isnegative,you probably have a viral pharyngitis. Thisdoes notneed to be treated with antibiotics.Until you receive the results of the strep test, you should stay home from work. If your child is being tested, he or she should stay home from school.  Home care   Rest at home. Drink plenty of fluids so you won't get dehydrated.   If the test is positive for strep, you or your child should not go towork or school for the first 2 days of taking the antibiotics. After this time, you or your child will not be contagious. You or your child can then return to work or school when feeling better.   Use the antibiotic medicinefor the full 10 days. Do not stop the medicine even if you or your child feel better. This is very important to make sure the infection is fully treated. It is also important to prevent medicine-resistant germs from growing.If you or your child were given an antibiotic shot, nomore antibiotics are needed.   Use throat lozenges or numbing throat sprays to help reduce pain. Gargling with warm salt water will also help reduce throat pain. Dissolve 1/2 teaspoon of salt in 1 glass of warm water. Children can sip on juice or a popsicle.  Children 5 years and older can also suck on a lollipop or hard candy.   Don't eat salty or spicy foods or give them to your child. These can irritate the throat.  Other medicine for a child: You can give your child acetaminophen for fever, fussiness, or discomfort. In babies over 6 months of age, you may use ibuprofen instead of acetaminophen. If your child has chronic liver or kidney disease or ever had a stomach ulcer or GI bleeding, talk with your child's healthcare provider before giving these medicines. Aspirin should never be used by any child under 18 years of age who has a fever. It may cause severe liver damage.  Other medicine for an adult: You may use acetaminophen or ibuprofen to control pain or fever, unless another medicine was prescribed for this. If you have chronic liver or kidney disease or ever had a stomach ulcer or GI bleeding, talk with your healthcare provider before using these medicines.  Follow-up care  Follow up with your healthcare provider or our staff if you or your child don't get better over the next week.  When to seek medical advice  Call your healthcare provider right away if any of these occur:   Feveras directed by your healthcare provider. For children, seek care if:  ? Your child is of any age and has repeated fevers above 104F (40C).  ? Your child is younger than 2 years of age and has a fever of 100.4F (38C) for more than 1 day.  ?   Your child is 2 years old or older and has a fever of 100.4F (38C) for more than 3 days.   New or worsening ear pain, sinus pain, or headache   Painful lumps in the back of neck   Stiff neck   Lymph nodes are getting larger   .Can't swallow liquids, a lot of drooling, or can't open mouth wide due to throat pain   Signs of dehydration, such as very dark urine or no urine, sunken eyes, dizziness   Trouble breathing or noisy breathing   Muffled voice   New rash   Other symptoms getting worse  Prevention  Here are steps you can take  to help prevent an infection:   Keep good hand washing habits.   Don't have close contact with people who have sore throats, colds, or other upper respiratory infections.   Don't smoke, and stay away from secondhand smoke.   Stay up to date with of your vaccines.  [employer] last reviewed this educational content on 07/30/2016   2000-2019 [employer] [address], [place] [address]. All rights reserved. This information is not intended as a substitute for professional medical care. Always follow your healthcare professional's instructions.

## 2018-11-04 NOTE — Progress Notes (Signed)
Throat Culture Sent.    [facility] Yes

## 2018-11-08 LAB — UPPER RESPIRATORY CULTURE

## 2018-11-09 ENCOUNTER — Ambulatory Visit (INDEPENDENT_AMBULATORY_CARE_PROVIDER_SITE_OTHER): Payer: 59 | Admitting: Adult Health

## 2018-11-10 ENCOUNTER — Encounter (INDEPENDENT_AMBULATORY_CARE_PROVIDER_SITE_OTHER): Payer: Self-pay | Admitting: Adult Health

## 2018-11-10 ENCOUNTER — Ambulatory Visit (INDEPENDENT_AMBULATORY_CARE_PROVIDER_SITE_OTHER): Payer: 59 | Admitting: Adult Health

## 2018-11-10 VITALS — BP 134/80 | HR 80 | Temp 98.1°F | Resp 18 | Wt 224.6 lb

## 2018-11-10 DIAGNOSIS — F320 Major depressive disorder, single episode, mild: Secondary | ICD-10-CM

## 2018-11-10 DIAGNOSIS — R5383 Other fatigue: Secondary | ICD-10-CM

## 2018-11-10 DIAGNOSIS — R0683 Snoring: Secondary | ICD-10-CM

## 2018-11-10 NOTE — Progress Notes (Signed)
Subjective:    Patient ID: Clayton Davis is a 28 y.o. male.    HPI   Was seen for depression was started on Wellbutrin 150 mg XL daily. Feels imrpovement with energy and motivation. Denies SI/HI. Is not wanting to increase dose at this time  ENT referral placed last visit for snoring and daytime fatigue and has appointment in March on the 2nd.      The following portions of the patient's history were reviewed and updated as appropriate: allergies, current medications, past family history, past medical history, past social history, past surgical history and problem list.    Review of Systems   Constitutional: Negative for activity change and fatigue.   HENT: Negative for congestion, nosebleeds and trouble swallowing.    Eyes: Negative for pain.   Respiratory: Negative for apnea, cough and shortness of breath.         Night time snoring   Cardiovascular: Negative for chest pain.   Endocrine: Negative for cold intolerance and polydipsia.   Neurological: Negative for dizziness and headaches.   Psychiatric/Behavioral: Negative for self-injury and suicidal ideas.           Objective:    Physical Exam  Constitutional:       Appearance: Normal appearance.   HENT:      Head: Normocephalic and atraumatic.      Nose:      Comments: Narrow and deviated septum.      Mouth/Throat:      Mouth: Mucous membranes are moist.   Eyes:      Pupils: Pupils are equal, round, and reactive to light.   Cardiovascular:      Rate and Rhythm: Normal rate and regular rhythm.   Pulmonary:      Effort: Pulmonary effort is normal.   Abdominal:      General: Abdomen is flat.      Palpations: Abdomen is soft.   Musculoskeletal:         General: No swelling.   Skin:     General: Skin is warm.   Neurological:      General: No focal deficit present.      Mental Status: He is alert and oriented to person, place, and time.   Psychiatric:         Mood and Affect: Mood normal.         Behavior: Behavior normal.         Thought Content: Thought content  normal.             Assessment:       1. Current mild episode of major depressive disorder, unspecified whether recurrent    2. Snoring    3. Other fatigue          Plan:       1. Continue current dose of Wellbutrin. Gave ER precautions if SI/HI thoughts. F/U six months to discuss if he wants to continue current dose.  2. Has appointment scheduled with ENT for night time snoring. Assess in anatomical - appears to have mild deviated septum. No congestion noted.  3. Daytime fatigue most likely 2/2 to snoring and or depression. Wellbutrin has improved his daytime fatigue some and labs are normal.

## 2018-11-29 ENCOUNTER — Ambulatory Visit: Payer: 59 | Attending: Otolaryngology | Admitting: Otolaryngology

## 2018-11-29 ENCOUNTER — Encounter (RURAL_HEALTH_CENTER): Payer: Self-pay | Admitting: Otolaryngology

## 2018-11-29 VITALS — Ht 72.0 in | Wt 215.0 lb

## 2018-11-29 DIAGNOSIS — G4733 Obstructive sleep apnea (adult) (pediatric): Secondary | ICD-10-CM

## 2018-11-29 NOTE — Progress Notes (Signed)
HPI:  Clayton Davis is a 28 y.o. gentleman who has had a substantial increase in snoring and apneic pauses with gasping and choking witnessed at night by his fiance.  He has gained about 40 to 60 pounds over the last few years, and has noticed an increase in hypersomnolence as well.  He has been evaluated by an endocrinologist before he moved to this area because of increasing fatigue and decreased libido.  Apparently everything seemed pretty normal from a blood work perspective, but they were getting an MRI scan of his pituitary which he failed to complete because of a panic attack while in the machine.  He subsequently moved, but has not seen an endocrinologist since arriving here.  He did have thyroid function studies drawn just a few months ago which were normal.    He does have some mild symptoms of nasal congestion but no nasal discharge.  He has sore throat on occasion in the morning.  He does not drink alcohol.     PMH:  Past Medical History:   Diagnosis Date    No known health problems        PSH:  Past Surgical History:   Procedure Laterality Date    NO PAST SURGERIES         Meds:    Current Outpatient Medications:     buPROPion XL (WELLBUTRIN XL) 150 MG 24 hr tablet, Take 1 tablet (150 mg total) by mouth daily, Disp: 30 tablet, Rfl: 1    Magic Mouthwash (w/o nystatin), Take 15 mLs by mouth 4 (four) times daily as needed (sore throat) Maalox, liquid benadryl, viscous lidocaine 1:1:1 ratio, gargle/spit, Disp: 120 mL, Rfl: 0    triamcinolone (KENALOG) 0.025 % cream, Apply to affected area two times daily for two weeks and then one time daily for two weeks, Disp: 80 g, Rfl: 1    Allergies:  Allergies   Allergen Reactions    Quetiapine Hives       FH:  Theres no family history of bleeding disorders or problems with anesthesia    SH:  Social History     Socioeconomic History    Marital status: Unknown     Spouse name: Not on file    Number of children: Not on file    Years of education: Not on  file    Highest education level: Not on file   Occupational History    Not on file   Social Needs    Financial resource strain: Not on file    Food insecurity:     Worry: Not on file     Inability: Not on file    Transportation needs:     Medical: Not on file     Non-medical: Not on file   Tobacco Use    Smoking status: Former Smoker     Packs/day: 1.00     Years: 10.00     Pack years: 10.00     Types: Cigarettes     Last attempt to quit: 10/12/2016     Years since quitting: 2.1    Smokeless tobacco: Never Used   Substance and Sexual Activity    Alcohol use: Yes     Comment: Socially x once a month    Drug use: Never    Sexual activity: Yes   Lifestyle    Physical activity:     Days per week: Not on file     Minutes per session: Not on file  Stress: Not on file   Relationships    Social connections:     Talks on phone: Not on file     Gets together: Not on file     Attends religious service: Not on file     Active member of club or organization: Not on file     Attends meetings of clubs or organizations: Not on file     Relationship status: Not on file    Intimate partner violence:     Fear of current or ex partner: Not on file     Emotionally abused: Not on file     Physically abused: Not on file     Forced sexual activity: Not on file   Other Topics Concern    Not on file   Social History Narrative    Not on file       ROS:  ENT system as noted above.  Neuro, GI, cardiac and respiratory systems reviewed and pertinent positives are noted above    EXAM:    General:  Well developed, well nourished.  Comfortable in no apparent distress.  Verbal communication is normal    Head:  Normocephalic, atraumatic.    Eyes:  Pupils equal, round and reactive to light.  No nystagmus    Ears:  External ears without deformity  External ear canal clear without mass.    TM intact without perforation  No fluid identified    Nose:  External nose is unremarkable.  He has a narrow nose with very mild external valve  collapse improved with alar flaring  Anterior rhinoscopy reveals moist pink mucosa.  Inferior turbinates are pink.  Nasal septum is midline.  There is no internal valve collapse.  Cottle maneuver is negative.  No mucupurulence seen.    Oral Cavity/ Oropharynx:  Mucous membranes moist and pink.  Hard and soft palate intact without masses.  Gingiva pink.  Dentition intact and in good repair.  He is Mallampati class III, but his palate is not elongated and his tonsils are nonenlarged. Posterior pharyngeal wall without exudate or discharge.      Neck:  No parotid or submandibular enlargement.  Supple without masses, asymmetry or crepitus.  Trachea is midline.  Thyroid not enlarged.  There is no cervical lymphadenopathy    Neuro:  Cranial nerves 2-12 intact, symmetric bilaterally    Respiratory:  Breathing comfortably without stridor or sterter.  No retractions or cyanosis.    ASSESSMENT/PLAN:  [name] is a very pleasant 28 year old gentleman with fairly substantial signs and symptoms of obstructive sleep apnea.  I talked to him about the impact of weight gain and obstructive sleep apnea.  I suspect that that will be his best long-term solution.  We talked about different strategies to help him decrease his caloric intake and I suspect that a consultation with dietitian could be very helpful.  He does have more substantial symptoms however on a daily basis with hypersomnolence and I recommended consultation with our sleep lab, and he may be best treated with a split-night study to get him fit with a CPAP machine as soon as possible.  Proper treatment of his sleep apnea may make further weight reduction easier.  It is entirely possible that his fatigue and decreased libido are also a result of his untreated sleep apnea, but if they persist he may want to consider reevaluation with our endocrinologist.    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  I do not think this will make a dramatic impact on his day-to-day symptoms.        This note was completed using [employer], medical. Grammatical errors, random word insertions, pronoun errors and incomplete sentences are occasional consequences of this technology due to software limitations. If there are questions or concerns about the content of this note or information contained within the body of this dictation they should be addressed with the provider for clarification.    Kendal Hymen, MD  [facility]  [address], [address]  [place], [place]  [address]  [phone]

## 2018-12-02 ENCOUNTER — Other Ambulatory Visit (INDEPENDENT_AMBULATORY_CARE_PROVIDER_SITE_OTHER): Payer: Self-pay | Admitting: Adult Health

## 2019-03-11 ENCOUNTER — Inpatient Hospital Stay: Admission: RE | Admit: 2019-03-11 | Payer: 59 | Source: Ambulatory Visit

## 2019-03-14 ENCOUNTER — Inpatient Hospital Stay: Admission: RE | Admit: 2019-03-14 | Payer: 59 | Source: Ambulatory Visit

## 2019-03-14 DIAGNOSIS — G4710 Hypersomnia, unspecified: Secondary | ICD-10-CM

## 2019-03-14 DIAGNOSIS — G4733 Obstructive sleep apnea (adult) (pediatric): Secondary | ICD-10-CM

## 2019-03-14 DIAGNOSIS — R0683 Snoring: Secondary | ICD-10-CM

## 2019-03-15 NOTE — Progress Notes (Signed)
Date: 03/14/2019  [facility] NEW PATIENT CONSULTATION    CHIEF COMPLAINT: "I am tired all the time."    HISTORY OF PRESENT ILLNESS: This gentleman is 28 years old.  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    The patient states that for the past several months likely  longer, he is having difficulty with daytime fatigue and  sleepiness.  The patient states that he awakens in the morning,  feeling poorly rested.    The patient's Epworth Sleepiness Scale is 20.    The patient feels that he is unable to get good quality sleep.    The patient also notes that he is up several times at night to  urinate.    The patient does have snoring according to his spouse.  Spouse  also notices recurrent apneic events throughout the night.  The  patient does have gasping arousals and awakenings at nighttime.    The patient denies sleepwalking or sleep talking.  He does  however have periodic limb movements at night.    He has been referred by Dr. Raynald Kemp in [facility].    MEDICINES: Noted.    ALLERGIES: Noted.    PHYSICAL EXAMINATION:  GENERAL: Delightful gentleman.  Looks well.  He is somewhat  overweight.  VITAL SIGNS:  Noted.  HEENT:  Normocephalic, atraumatic head.  Pupils are equal and  round.  Eyelids and conjunctivae are unremarkable.  Trachea  midline and mobile.  There is no stridor.  Oropharynx, redundant  tissue, soft palate and uvula.  Mallampati score is III.  LUNGS:  Clear without wheeze or rales.  CARDIAC: Regular rhythm.  No gallop.  EXTREMITIES:  No cyanosis or clubbing.  MENTAL STATUS:  Oriented x3 with adequate memory and mood.  NEUROLOGIC: Gait and station are normal.    ASSESSMENT: A very pleasant gentleman with what appears to be  obstructive sleep apnea.  The patient has daytime fatigue.  He  has loud snoring, witnessed apneic events throughout the night.    Pathophysiology of the obstructive sleep apnea syndrome was  reviewed with the patient.  Usual testing environment  and  treatment modalities were discussed.    At this time, we will set the patient up for a home sleep study.  We will follow up with him after that sleep study has been  performed.        [id number]  DD: 03/14/2019 15:41:05  DT: 03/14/2019 18:11:12  JOB: [id number]

## 2019-04-27 DIAGNOSIS — G4719 Other hypersomnia: Secondary | ICD-10-CM

## 2019-04-27 DIAGNOSIS — G4733 Obstructive sleep apnea (adult) (pediatric): Secondary | ICD-10-CM

## 2019-04-27 DIAGNOSIS — R0683 Snoring: Secondary | ICD-10-CM

## 2019-05-19 ENCOUNTER — Telehealth: Payer: Self-pay | Admitting: Pulmonary Disease

## 2019-05-25 ENCOUNTER — Other Ambulatory Visit: Payer: Self-pay

## 2019-06-01 ENCOUNTER — Other Ambulatory Visit: Payer: Self-pay

## 2019-06-08 ENCOUNTER — Ambulatory Visit
Admission: RE | Admit: 2019-06-08 | Discharge: 2019-06-08 | Disposition: A | Discharge status: Home / Self Care | Payer: 59 | Source: Ambulatory Visit | Attending: Pulmonary Disease | Admitting: Pulmonary Disease

## 2019-06-08 DIAGNOSIS — G4710 Hypersomnia, unspecified: Secondary | ICD-10-CM | POA: Insufficient documentation

## 2019-06-08 DIAGNOSIS — R0683 Snoring: Secondary | ICD-10-CM

## 2019-06-08 DIAGNOSIS — G4733 Obstructive sleep apnea (adult) (pediatric): Secondary | ICD-10-CM | POA: Insufficient documentation

## 2019-06-08 DIAGNOSIS — G4719 Other hypersomnia: Secondary | ICD-10-CM

## 2019-06-08 NOTE — Progress Notes (Signed)
Pt had a [facility]PAT-CLINIC.  E. Kelcy Baeten, RPSGT

## 2019-06-09 ENCOUNTER — Other Ambulatory Visit (INDEPENDENT_AMBULATORY_CARE_PROVIDER_SITE_OTHER): Payer: Self-pay | Admitting: Adult Health

## 2019-06-13 NOTE — Procedures (Signed)
Date: 06/08/2019  TYPE OF STUDY: Overnight polysomnography.    REQUESTING PHYSICIAN: Fayrene Helper, M.D.    INTERPRETING PHYSICIAN: Fayrene Helper, M.D.    INDICATION: Evaluate for sleep apnea.    FINDINGS: Body mass index is 29.    The patient was studied for 3 hours and 36 minutes.  During this  time, the patient demonstrated 51 episodes of oxygen  desaturation.  Lowest oxygen saturation score was 88%.    Snoring was noted in the early part of the recording.    The patient demonstrated 86 sleep-related breathing events.  This achieves an apnea-hypopnea index of 24.    ASSESSMENT: This overnight polysomnography does demonstrate  obstructive sleep apnea with an elevated apnea-hypopnea index  and episodes of oxygen desaturation.  We would recommend  returning the patient sleep lab for follow up overnight  polysomnography with continuous positive airway pressure  titration.        [id number]  DD: 06/13/2019 17:14:22  DT: 06/13/2019 19:10:17  JOB: [id number]

## 2019-06-21 ENCOUNTER — Telehealth: Payer: Self-pay | Admitting: Pulmonary Disease

## 2019-06-21 NOTE — Telephone Encounter (Signed)
Lm for pt to cb and review sleep results.a s

## 2019-07-14 ENCOUNTER — Encounter: Payer: Self-pay | Admitting: Pulmonary Disease

## 2019-07-14 NOTE — Progress Notes (Signed)
apap rx has been sent to [facility] for for set up. Pt is aware he is to call as soon as device is received so a fu apptmnt can be made. as

## 2019-11-08 ENCOUNTER — Encounter (INDEPENDENT_AMBULATORY_CARE_PROVIDER_SITE_OTHER): Payer: Self-pay | Admitting: Physician Assistant

## 2019-11-08 ENCOUNTER — Ambulatory Visit
Admission: RE | Admit: 2019-11-08 | Discharge: 2019-11-08 | Disposition: A | Discharge status: Home / Self Care | Payer: Medicaid HMO | Source: Ambulatory Visit | Attending: Physician Assistant | Admitting: Physician Assistant

## 2019-11-08 ENCOUNTER — Telehealth (INDEPENDENT_AMBULATORY_CARE_PROVIDER_SITE_OTHER): Payer: Self-pay

## 2019-11-08 ENCOUNTER — Ambulatory Visit (INDEPENDENT_AMBULATORY_CARE_PROVIDER_SITE_OTHER): Payer: Medicaid HMO | Admitting: Physician Assistant

## 2019-11-08 VITALS — BP 131/81 | HR 81 | Temp 98.0°F | Resp 18 | Ht 72.0 in | Wt 220.0 lb

## 2019-11-08 DIAGNOSIS — M79662 Pain in left lower leg: Secondary | ICD-10-CM

## 2019-11-08 DIAGNOSIS — I82812 Embolism and thrombosis of superficial veins of left lower extremities: Secondary | ICD-10-CM

## 2019-11-08 DIAGNOSIS — I82409 Acute embolism and thrombosis of unspecified deep veins of unspecified lower extremity: Secondary | ICD-10-CM | POA: Insufficient documentation

## 2019-11-08 DIAGNOSIS — M7989 Other specified soft tissue disorders: Secondary | ICD-10-CM

## 2019-11-08 DIAGNOSIS — R609 Edema, unspecified: Secondary | ICD-10-CM | POA: Insufficient documentation

## 2019-11-08 MED ORDER — CEPHALEXIN 500 MG PO CAPS
500.00 mg | ORAL_CAPSULE | Freq: Four times a day (QID) | ORAL | 0 refills | Status: AC
Start: 2019-11-08 — End: 2019-11-15

## 2019-11-08 NOTE — Progress Notes (Signed)
Subjective:    Patient ID: Clayton Davis is a 29 y.o. male.    The patient and clinic team members used loop mask, eye protection and gloves for PPE.     Pt reports pain and itching couple days ago.  Woke up with swelling, warmth, and redness behind L knee yesterday morning.  Pain improved with walking.  Pt reports affects gait - favors L leg.  Pain worse in the morning.  Pt denies fevers, recent illnesses, attempting medication/therapies.  Pt denies history of MRSA or MRSA exposure.  Denies known injuries to leg or skin.  Denies history of blood clots, recent travel, or hormone use.      The following portions of the patient's history were reviewed and updated as appropriate: allergies, current medications, past family history, past medical history, past social history, past surgical history and problem list.    Review of Systems   Constitutional: Negative for chills, fatigue and fever.   Respiratory: Negative for cough, chest tightness and shortness of breath.    Cardiovascular: Negative for chest pain and palpitations.   Gastrointestinal: Negative for nausea and vomiting.   Musculoskeletal: Negative for arthralgias and myalgias.   Skin: Positive for color change and rash. Negative for pallor and wound.   Allergic/Immunologic: Negative for environmental allergies and food allergies.   Neurological: Positive for headaches (Chronic). Negative for dizziness and syncope.         Objective:    BP 131/81    Pulse 81    Temp 98 F (36.7 C)    Resp 18    Ht 1.829 m (6')    Wt 99.8 kg (220 lb)    BMI 29.84 kg/m     Physical Exam  Vitals signs and nursing note reviewed.   Constitutional:       General: He is not in acute distress.     Appearance: He is well-developed. He is not diaphoretic.   HENT:      Head: Normocephalic.   Eyes:      Conjunctiva/sclera: Conjunctivae normal.   Cardiovascular:      Rate and Rhythm: Normal rate.      Pulses: Normal pulses.           Radial pulses are 2+ on the right side and 2+ on the  left side.        Dorsalis pedis pulses are 2+ on the right side and 2+ on the left side.        Posterior tibial pulses are 2+ on the right side and 2+ on the left side.      Heart sounds: Normal heart sounds. Heart sounds not distant. No murmur. No friction rub. No gallop.       Comments: Calf measurement 42cm bilaterally  Pulmonary:      Effort: Pulmonary effort is normal. No tachypnea or respiratory distress.      Breath sounds: Normal breath sounds and air entry. No decreased breath sounds, wheezing, rhonchi or rales.   Musculoskeletal: Normal range of motion.         General: Swelling and tenderness present. No signs of injury.      Right lower leg: No edema.      Left lower leg: 1+ Pitting Edema present.   Skin:     General: Skin is warm and dry.      Findings: Erythema present. No abrasion, abscess, bruising, ecchymosis, signs of injury, lesion, rash or wound.  Comments: 17.0x3.0cm linear, warm, non-indurated, poorly demarcated patch of erythema on skin overlying posterior-medial aspect of knee. No purulent drainage observed. Erythema follows along pathway of saphenous vein.  Skin integrity intact.  Tender to palpation.  Leg strength (5/5) bilaterally in all movements.  Sensation intact.  DP/PT pulses 2+ bilaterally.   Neurological:      Mental Status: He is alert and oriented to person, place, and time.      Sensory: Sensation is intact. No sensory deficit.      Motor: No weakness.           Assessment and Plan:       Konner was seen today for leg injury.    Diagnoses and all orders for this visit:    Pain and swelling of left lower leg  -     US Venous Low Extrem Duplx Dopp Uni Left; Future  -     cephalexin (KEFLEX) 500 MG capsule; Take 1 capsule (500 mg total) by mouth 4 (four) times daily for 7 days    Thrombosis of left saphenous vein    US Venous Low Extrem Duplx Dopp Uni Left    Result Date: 11/08/2019  1.  NO LEFT LEG DVT FROM COMMON FEMORAL VEIN THROUGH CALF. 2.  DISTAL GREATER SAPHENOUS  VEIN THROMBOSIS. ReadingStation:[id number]    Wells score 1 (Moderate DVT risk).  STAT venous US of L leg indicated to rule out DVT.      Discussed patient case and ultrasound results with Dr. Joseph Art, discussion entailed emergency department evaluation versus primary care follow-up in the next 48 to 72 hours.  Dr. Joseph Art advised coverage with Keflex 4 times daily as erythema, pain and swelling have developed so quickly over the past 2 days, cover patient for cellulitis.    Contacted patient, as he has no primary care listed in his EMR.  Patient stated that he sees a physician at [facility].  Contacted [facility], gave diagnosis and concern that patient may possibly be at intermediate risk for DVT development as he has a thrombosis in the distal greater saphenous vein.  There is a possibility patient may require starting prophylactic anticoagulant therapy, as there is no distance given from the location of the thrombosis to the junction with the saphenous vein.    Nurse at [facility] took down patient's information, and stated that she would review this with the patient's physician and attempt to work the patient in over the next 48 to 72 hours.  They will contact the patient with scheduled appointment time.  Advised nursing staff that if patient was unable to be evaluated within this time window, it would be our advise that he go directly to the emergency department for evaluation for potential anticoagulation therapy.  Clinical staff stated understanding    Contacted patient at 1300 hrs. advised that [facility] is aware of his diagnosis and they will be contacting him to schedule an appointment.  Patient was again advised that if he is unable to be seen in the 48 to 72-hour time window that he is to go to the emergency department for potential anticoagulation therapy.  Reviewed signs and symptoms of DVT complications that require prompt evaluation in the  emergency department.  Patient states understanding and states that he will go directly to the ED if any of these symptoms develop.    Follow up with PCP or RTC if there are any new or worsening symptoms or if the symptoms  are lasting longer than expected.  Patient/guardian expressed understanding and agreement with plan of care at time of discharge.        Patient Instructions     Understanding Venous Thromboembolism  Venous thromboembolism is when a blood clot forms in a vein. The term refers to two linked conditions: deep vein thrombosis (DVT) and pulmonary embolism (PE). If you have DVT, a blood clot has formed in a deep vein. It often happens in a leg. Sometimes this clot can break free and travel to your lungs. Once there, it can block blood flow. This is called a pulmonary embolism or PE. It's a health emergency.     How to say it  VEE-nuhs thrawm-boh-EHM-boh-lih-zuhm   What causes venous thromboembolism?  A blood clot can form in a vein for many reasons. The cause may be partly your genes. For example, you may have a protein deficiency or a blood-clotting disorder. Your family history may also make you more prone to the problem.   Other things can also raise your chance for a blood clot in a vein. These include:   Major surgery, such as a joint replacement   Injury, such as a broken leg or damaged spinal cord   Cancer   Heart failure   Older age   Obesity   Smoking   Some medicines, such as birth control pills or hormone replacement therapy   Pregnancy   Long periods of not moving enough, such as after a surgery or on a long airline flight  Symptoms of venous thromboembolism  You may have no symptoms. But if you do, they can start quickly. Symptoms of DVT are:   Redness near the vein   Swelling   Pain   Changes in the color of the skin  A pulmonary embolism is when the blood clot travels to the lungs. It is a health emergency. It can cause trouble breathing, chest pain, coughing up of blood,  and an irregular heartbeat. Symptoms may look like those of a heart attack. It can cause sudden death.   Treatment for venous thromboembolism  The goal of treatment is to break up any blood clots and stop new ones from forming. A pulmonary embolism is then less likely to happen. Treatment includes:    Blood thinners. These medicines may be given as a shot, through an IV (intravenous), or as a pill. You may need to take them for a few months or longer. It depends on the cause of the blood clot.   Walking. Once your symptoms are under control, your healthcare provider may recommend that you walk. You may not be able to walk too far at first, such as after a surgery. But it will stop more blood clots from forming and help you feel better.   Compression socks. These socks or other similar devices can lower your risk for blood clots. They gently put pressure on the lower leg. This may help ease pain and swelling.   Inferior vena cava filter. This small metal device is put into your inferior vena cava. Thats the main vein that runs from your legs to your heart and lungs. It catches large clots before they reach these organs. It can stop a pulmonary embolism. You may need this treatment if you arent able to take blood thinners.   Surgery. In some cases, you may need surgery to remove a clot, especially if it has already reached the lungs.  Possible complications of venous thromboembolism   New  clots   High blood pressure in the arteries to the lungs (pulmonary hypertension)   Weak valves in a vein (post-thrombotic syndrome). This can cause cramping, pain, and swelling.   Bleeding   Death    When to call your healthcare provider  Call your healthcare provider right away if you have any of these:   Fever of 100.76F (38C) or higher, or as directed by your healthcare provider   Redness, swelling, or fluid leaking from your incision that gets worse   Pain that gets worse   Symptoms that dont get better, or  get worse   New symptoms  StayWell last reviewed this educational content on 04/30/2019   2000-2020 [employer], [place]. [address], [place], [place] [address]. All rights reserved. This information is not intended as a substitute for professional medical care. Always follow your healthcare professional's instructions.            Grier Mitts, PA  [facility]  11/08/2019  1:25 PM

## 2019-11-08 NOTE — Patient Instructions (Signed)
Understanding Venous Thromboembolism  Venous thromboembolism is when a blood clot forms in a vein. The term refers to two linked conditions: deep vein thrombosis (DVT) and pulmonary embolism (PE). If you have DVT, a blood clot has formed in a deep vein. It often happens in a leg. Sometimes this clot can break free and travel to your lungs. Once there, it can block blood flow. This is called a pulmonary embolism or PE. It's a health emergency.     How to say it  VEE-nuhs thrawm-boh-EHM-boh-lih-zuhm   What causes venous thromboembolism?  A blood clot can form in a vein for many reasons. The cause may be partly your genes. For example, you may have a protein deficiency or a blood-clotting disorder. Your family history may also make you more prone to the problem.   Other things can also raise your chance for a blood clot in a vein. These include:   Major surgery, such as a joint replacement   Injury, such as a broken leg or damaged spinal cord   Cancer   Heart failure   Older age   Obesity   Smoking   Some medicines, such as birth control pills or hormone replacement therapy   Pregnancy   Long periods of not moving enough, such as after a surgery or on a long airline flight  Symptoms of venous thromboembolism  You may have no symptoms. But if you do, they can start quickly. Symptoms of DVT are:   Redness near the vein   Swelling   Pain   Changes in the color of the skin  A pulmonary embolism is when the blood clot travels to the lungs. It is a health emergency. It can cause trouble breathing, chest pain, coughing up of blood, and an irregular heartbeat. Symptoms may look like those of a heart attack. It can cause sudden death.   Treatment for venous thromboembolism  The goal of treatment is to break up any blood clots and stop new ones from forming. A pulmonary embolism is then less likely to happen. Treatment includes:    Blood thinners. These medicines may be given as a shot, through an IV  (intravenous), or as a pill. You may need to take them for a few months or longer. It depends on the cause of the blood clot.   Walking. Once your symptoms are under control, your healthcare provider may recommend that you walk. You may not be able to walk too far at first, such as after a surgery. But it will stop more blood clots from forming and help you feel better.   Compression socks. These socks or other similar devices can lower your risk for blood clots. They gently put pressure on the lower leg. This may help ease pain and swelling.   Inferior vena cava filter. This small metal device is put into your inferior vena cava. That's the main vein that runs from your legs to your heart and lungs. It catches large clots before they reach these organs. It can stop a pulmonary embolism. You may need this treatment if you aren't able to take blood thinners.   Surgery. In some cases, you may need surgery to remove a clot, especially if it has already reached the lungs.  Possible complications of venous thromboembolism   New clots   High blood pressure in the arteries to the lungs (pulmonary hypertension)   Weak valves in a vein (post-thrombotic syndrome). This can cause cramping, pain, and swelling.     Bleeding   Death    When to call your healthcare provider  Call your healthcare provider right away if you have any of these:   Fever of 100.4F (38C) or higher, or as directed by your healthcare provider   Redness, swelling, or fluid leaking from your incision that gets worse   Pain that gets worse   Symptoms that don't get better, or get worse   New symptoms  StayWell last reviewed this educational content on 04/30/2019   2000-2020 [employer] [address], [place] [address]. All rights reserved. This information is not intended as a substitute for professional medical care. Always follow your healthcare professional's instructions.

## 2019-11-08 NOTE — Telephone Encounter (Signed)
[facility] called and wanted to let you know pt has distal greater saphenous thrombosis of L leg. They are unable to do anticoagulation therapy there. Per Trinda Pascal pt should go to ER as we have no available appts. Called pt to notify him and advised him to go ER asap.   JZ

## 2019-11-09 ENCOUNTER — Emergency Department
Admission: EM | Admit: 2019-11-09 | Discharge: 2019-11-09 | Disposition: A | Discharge status: Home / Self Care | Payer: Medicaid HMO | Attending: Emergency Medicine | Admitting: Emergency Medicine

## 2019-11-09 DIAGNOSIS — I8002 Phlebitis and thrombophlebitis of superficial vessels of left lower extremity: Secondary | ICD-10-CM

## 2019-11-09 MED ORDER — IBUPROFEN 800 MG PO TABS
800.0000 mg | ORAL_TABLET | Freq: Three times a day (TID) | ORAL | 0 refills | Status: DC | PRN
Start: 2019-11-09 — End: 2021-07-09

## 2019-11-09 NOTE — ED Provider Notes (Signed)
History     Chief Complaint   Patient presents with    Blood Clots     Past two mornings awoke with pain inner right thigh and medial ankle - no injury    Had venous ultrasound yesterday - no DVT but clot in superficial saphenous vein    He tried to get an appointment with his primary care and was referred to the emergency department.  No chest pain or shortness of breath.        Past Medical History:   Diagnosis Date    No known health problems        Past Surgical History:   Procedure Laterality Date    NO PAST SURGERIES         Family History   Problem Relation Age of Onset    No known problems Mother     No known problems Father     No known problems Sister     No known problems Brother     No known problems Maternal Aunt     No known problems Maternal Uncle     No known problems Paternal Aunt     No known problems Paternal Uncle     Cancer Maternal Grandmother     Diabetes Maternal Grandfather     No known problems Paternal Grandmother     No known problems Paternal Grandfather        Social  Social History     Tobacco Use    Smoking status: Current Some Day Smoker     Packs/day: 1.00     Years: 10.00     Pack years: 10.00     Types: Cigarettes     Last attempt to quit: 10/12/2016     Years since quitting: 3.0    Smokeless tobacco: Never Used    Tobacco comment: social   Substance Use Topics    Alcohol use: Yes     Comment: Socially x once a month    Drug use: Never       .     Allergies   Allergen Reactions    Quetiapine Hives       Home Medications             buPROPion XL (WELLBUTRIN XL) 150 MG 24 hr tablet     TAKE 1 TABLET BY MOUTH EVERY DAY     busPIRone (BUSPAR) 5 MG tablet     Take 5 mg by mouth 3 (three) times daily     cephalexin (KEFLEX) 500 MG capsule     Take 1 capsule (500 mg total) by mouth 4 (four) times daily for 7 days     FLUoxetine (PROzac) 10 MG capsule     Take 10 mg by mouth every morning     Magic Mouthwash (w/o nystatin)     Take 15 mLs by mouth 4 (four) times  daily as needed (sore throat) Maalox, liquid benadryl, viscous lidocaine 1:1:1 ratio, gargle/spit           Review of Systems   Constitutional: Negative for fever.   Respiratory: Negative for shortness of breath.    Cardiovascular: Positive for leg swelling. Negative for chest pain.   All other systems reviewed and are negative.      Physical Exam    BP: 131/81, Heart Rate: 88, Temp: 98.4 F (36.9 C), Resp Rate: 17, SpO2: 98 %, Weight: 99.8 kg     Physical Exam  Vitals signs and nursing note  reviewed.   Constitutional:       Appearance: Normal appearance.   Cardiovascular:      Rate and Rhythm: Normal rate and regular rhythm.   Pulmonary:      Effort: Pulmonary effort is normal.   Musculoskeletal:      Comments: There is soft tissue swelling erythema and tenderness along the medial thigh distally.  He also has some mild tenderness over the saphenous vein at the medial malleolus of the ankle.  There is no redness or discoloration of the lower leg.  There is no swelling in the foot.  Dorsalis pedis pulse of the left foot is normal motor and sensory exam normal.  No calf tenderness.  No compartment syndrome findings.       Neurological:      Mental Status: He is alert.           MDM and ED Course     ED Medication Orders (From admission, onward)    None         US Venous Low Extrem Duplx Dopp Uni Left [VAS12] (Order [id number])  Status: Final result   Study Result    Clinical History:  Leg DVT suspected  left leg swelling, pain, pitting edema    Examination:  US VENOUS LOW EXTREM DUPLX DOPP UNI LEFT    COMPARISON:  None    TECHNIQUE:   Grayscale, color, duplex/spectral Doppler sonography left leg and right CFV    FINDINGS:  Left leg common femoral, femoral, popliteal, and calf veins compressible and color Doppler patent.  Normal augmentation with distal compression.   No internal echoes.  Distal greater saphenous vein thrombosis.  Contralateral right leg common femoral vein patent and compressible.    IMPRESSION:    1.  NO LEFT LEG DVT FROM COMMON FEMORAL VEIN THROUGH CALF.  2.  DISTAL GREATER SAPHENOUS VEIN THROMBOSIS.    ReadingStation:[id number]   Imaging    US Venous Low Extrem Duplx Dopp Uni Left (Order: [id number]) - 11/08/2019    MDM             I discussed this with Dr. Selena Batten vascular surgery on call.  She recommended Motrin and warm compresses.  She did not feel that the patient would need a follow-up ultrasound.  Did not feel anticoagulation was indicated.  Offered to follow-up the patient in the office.    I have relayed all this to the patient.  I discussed signs and symptoms for which to return.    Procedures    Clinical Impression & Disposition     Clinical Impression  Final diagnoses:   Thrombophlebitis of superficial veins of left lower extremity        ED Disposition     ED Disposition Condition Date/Time Comment    Discharge  Wed Nov 09, 2019 10:10 AM Roselyn Bering discharge to home/self care.    Condition at disposition: Stable           Discharge Medication List as of 11/09/2019 10:11 AM      START taking these medications    Details   ibuprofen (ADVIL) 800 MG tablet Take 1 tablet (800 mg total) by mouth every 8 (eight) hours as needed for Pain, Starting Wed 11/09/2019, E-Rx            This note was completed using dragon medical speech recognition software. Grammatical errors, random word insertions, pronoun errors, incorrect word insertion, misspellings  and incomplete sentences are occasional consequences of this technology due  to software limitations. If there are questions or concerns about the content of this note or information contained within the body of this dictation they should be addressed with the provider for clarification.         Manning Charity, MD  11/09/19 1945

## 2020-01-05 ENCOUNTER — Ambulatory Visit (INDEPENDENT_AMBULATORY_CARE_PROVIDER_SITE_OTHER): Payer: Medicaid HMO | Admitting: Adult Health

## 2020-02-07 ENCOUNTER — Encounter (INDEPENDENT_AMBULATORY_CARE_PROVIDER_SITE_OTHER): Payer: Self-pay

## 2020-02-07 ENCOUNTER — Telehealth (INDEPENDENT_AMBULATORY_CARE_PROVIDER_SITE_OTHER): Payer: Medicaid HMO | Admitting: Family Medicine

## 2020-02-07 VITALS — Ht 71.0 in | Wt 230.0 lb

## 2020-02-07 DIAGNOSIS — R21 Rash and other nonspecific skin eruption: Secondary | ICD-10-CM

## 2020-02-07 MED ORDER — TRIAMCINOLONE ACETONIDE 0.1 % EX CREA
TOPICAL_CREAM | Freq: Two times a day (BID) | CUTANEOUS | 0 refills | Status: AC
Start: 2020-02-07 — End: 2020-02-22

## 2020-02-07 MED ORDER — PREDNISONE 20 MG PO TABS
40.0000 mg | ORAL_TABLET | Freq: Every day | ORAL | 0 refills | Status: DC
Start: 2020-02-07 — End: 2020-06-29

## 2020-02-07 NOTE — Progress Notes (Signed)
Subjective:    Patient ID: Clayton Davis is a 29 y.o. male.    This is a pleasant 29 year old Caucasian male calling in today because he has had skin rash and some welts developing on his stomach abdominal area and down into his groin area.  Has been very itchy and keeping him awake at night.  He denies any new foods, drinks, medications.  No new skin products shampoos soaps.  No new detergents that he knows of.  No new close.  No new jewelry or anything like that.  He has not been in the outside environment and anything that could cause some itching like that.  He does not know about poison ivy or poison sumac.  Or poison oak.  He denies any difficulty breathing at any time.  No other complaints at this time      The following portions of the patient's history were reviewed and updated as appropriate: allergies, current medications, past family history, past medical history, past social history, past surgical history and problem list.    Review of Systems   Constitutional: Negative for activity change, chills, fatigue and fever.   HENT: Negative for congestion, ear pain, postnasal drip, rhinorrhea, sinus pressure, sinus pain, sore throat and voice change.    Respiratory: Negative for cough, chest tightness, shortness of breath and wheezing.    Cardiovascular: Negative for chest pain.   Gastrointestinal: Negative for abdominal pain, diarrhea, nausea and vomiting.   Musculoskeletal: Negative for arthralgias and myalgias.   Skin: Positive for rash.        Lower abdomen and down to groin   Neurological: Negative for dizziness, seizures, syncope, weakness, light-headedness and headaches.   Psychiatric/Behavioral: Negative for agitation and behavioral problems.         Objective:    Ht 1.803 m (5\' 11" )    Wt 104.3 kg (230 lb)    BMI 32.08 kg/m     Physical Exam  Vitals and nursing note reviewed.   Constitutional:       General: He is not in acute distress.     Appearance: Normal appearance. He is not ill-appearing.    HENT:      Head: Normocephalic and atraumatic.   Pulmonary:      Effort: Pulmonary effort is normal. No respiratory distress.   Skin:     Coloration: Skin is not pale.      Findings: No erythema.   Neurological:      General: No focal deficit present.      Mental Status: He is alert and oriented to person, place, and time.      Cranial Nerves: No cranial nerve deficit.   Psychiatric:         Mood and Affect: Mood normal.         Behavior: Behavior normal.         Thought Content: Thought content normal.         Judgment: Judgment normal.           Assessment and Plan:       [place] was seen today for rash.    Diagnoses and all orders for this visit:    Rash and nonspecific skin eruption  -     triamcinolone (KENALOG) 0.1 % cream; Apply topically 2 (two) times daily for 15 days  -     predniSONE (DELTASONE) 20 MG tablet; Take 2 tablets (40 mg total) by mouth daily    He is to let us know  in 3 or 4 days if this is not clearing up.  Take prednisone and apply triamcinolone as directed.  He further denies staying in any hotels sleeping in someone else's home or having someone sleep in his home.  His wife has not had the same symptoms.  No new medications, foods, drinks, skin products, laundry detergents, new close, jewelry.  He does not know of any new allergic contacts.  He will watch closely    He is to keep tight Covid precautions of wearing a mask, washing hands religiously, social distancing of 6 feet but 10 feet is better, and limiting conversation to 5 minutes or less.  Keep rest and fluids going.  If there is any respiratory distress get to the hospital immediately.  Keep rest and fluids going.      The patient was evaluated using telehealth platform. Exam findings are visual observation and/or having patient perform various physical exam tasks under my guidance. Verbal consent for evaluation was obtained.         Anson [place], DO  [facility]  02/07/2020  5:30 PM

## 2020-02-07 NOTE — Patient Instructions (Signed)
Triamcinolone skin cream, ointment, lotion, or aerosol  Brand Names: Aristocort, Aristocort A, Aristocort HP, Cinalog, Cinolar, Flutex, Kenalog, Pediaderm TA, Sila III, Triacet, Trianex, Triderm  What is this medicine?  TRIAMCINOLONE (trye am SIN oh lone) is a corticosteroid. It is used on the skin to reduce swelling, redness, itching, and allergic reactions.  How should I use this medicine?  This medicine is for external use only. Do not take by mouth. Follow the directions on the prescription label. Wash your hands before and after use. Apply a thin film of medicine to the affected area. Do not cover with a bandage or dressing unless your doctor or health care professional tells you to. Do not use on healthy skin or over large areas of skin. Do not get this medicine in your eyes. If you do, rinse out with plenty of cool tap water. It is important not to use more medicine than prescribed. Do not use your medicine more often than directed.  Talk to your pediatrician regarding the use of this medicine in children. Special care may be needed.  Elderly patients are more likely to have damaged skin through aging, and this may increase side effects. This medicine should only be used for brief periods and infrequently in older patients.  What side effects may I notice from receiving this medicine?  Side effects that you should report to your doctor or health care professional as soon as possible:   burning or itching of the skin   dark red spots on the skin   infection   painful, red, pus filled blisters in hair follicles   thinning of the skin, sunburn more likely especially on the face  Side effects that usually do not require medical attention (report to your doctor or health care professional if they continue or are bothersome):   dry skin, irritation   unusual increased growth of hair on the face or body  What may interact with this medicine?  Interactions are not expected.  What if I miss a dose?  If you  miss a dose, use it as soon as you can. If it is almost time for your next dose, use only that dose. Do not use double or extra doses.  Where should I keep my medicine?  Keep out of the reach of children.  Store at room temperature between 15 and 30 degrees C (59 and 86 degrees F). Do not freeze. Throw away any unused medicine after the expiration date.  What should I tell my health care provider before I take this medicine?  They need to know if you have any of these conditions:   diabetes   infection, like tuberculosis, herpes, or fungal infection   large areas of burned or damaged skin   skin wasting or thinning   an unusual or allergic reaction to triamcinolone, corticosteroids, other medicines, foods, dyes, or preservatives   pregnant or trying to get pregnant   breast-feeding  What should I watch for while using this medicine?  Tell your doctor or health care professional if your symptoms do not start to get better within one week. Do not use for more than 14 days. Do not use on healthy skin or over large areas of skin. Tell your doctor or health care professional if you are exposed to anyone with measles or chickenpox, or if you develop sores or blisters that do not heal properly.  Do not use an airtight bandage to cover the affected area unless your doctor or  health care professional tells you to. If you are to cover the area, follow the instructions carefully. Covering the area where the medicine is applied can increase the amount that passes through the skin and increases the risk of side effects.  If treating the diaper area of a child, avoid covering the treated area with tight-fitting diapers or plastic pants. This may increase the amount of medicine that passes through the skin and increase the risk of serious side effects.  NOTE:This sheet is a summary. It may not cover all possible information. If you have questions about this medicine, talk to your doctor, pharmacist, or health care provider.  Copyright 2020 Elsevier        Prednisone tablets  Brand Names: Deltasone, Predone, Sterapred, Sterapred DS  Qu es este medicamento?  La PREDNISONA es un corticosteroide. Se utiliza para tratar la inflamacin de la piel, articulaciones, pulmones y otros rganos. Condiciones comunes tratadas incluyen asma, alergias y artritis. Tambin se [place] para [employer], tales como trastornos sanguneos o enfermedades de la glndula suprarrenal.  Cmo debo utilizar este medicamento?  Tome este medicamento por [place] oral con un vaso de agua. Siga las instrucciones de la etiqueta del [place]. Tome este medicamento con alimentos. Si est tomando este medicamento una vez por da, [employer]. No tome ms medicamento que la cantidad indicada. No deje de usar su medicamento repentinamente porque puede desarrollar una reaccin grave. Su mdico le dir cunto Risk manager. Si su mdico desea que deje de Astronomer, es probable que la dosis se vaya disminuyendo lentamente durante un tiempo para Printmaker secundario.  Hable con su pediatra para informarse acerca del uso de este medicamento en nios. Puede requerir atencin especial.  Qu efectos secundarios puedo tener al [employer] este medicamento?  Efectos secundarios que debe informar a su mdico o a Producer, television/film/video de la salud tan pronto como sea posible:  Therapist, art, como erupcin cutnea, comezn/picazn o urticaria, e hinchazn de la cara, los labios o la lengua cambios en las emociones o el estado de nimo cambios en la visin estado de nimo deprimido dolor ocular fiebre o escalofros, tos, dolor de garganta, dolor o dificultad para orinar signos y sntomas de niveles altos de azcar en la sangre, como tener ms sed o apetito, o tener que orinar con mayor frecuencia que lo habitual. Tambin puede sentirse muy cansado o tener visin borrosa. hinchazn de tobillos, pies  Efectos secundarios que generalmente no requieren  atencin mdica (infrmelos a su mdico o a su profesional de la salud si persisten o si son molestos):  confusin, excitacin, inquietud dolor de cabeza nuseas, vmito problemas de piel, acn, piel delgada y brillante dificultad para dormir aumento de peso  Qu puede interactuar con este medicamento?  No tome esta medicina con ninguno de los siguientes medicamentos:   metirapona   mifepristona  Esta medicina tambin puede interactuar con los siguientes medicamentos:   aminoglutetimida   anfotericina B   aspirina o medicamentos tipo aspirina   barbitricos   ciertos medicamentos para la diabetes, tales como glipizida o gliburida   colestiramina   inhibidores de colinesterasa   ciclosporina   digoxina   diurticos   efedrina   hormonas femeninas, como estrgenos, progestinas o pldoras anticonceptivas   isoniazida   quetoconazol   los [place], medicamentos para Chief Technology Officer o inflamacin, como ibuprofeno o naproxeno   fenitona   rifampicina   toxoide   vacunas   warfarina  Qu sucede  si me olvido de una dosis?  Si olvida una dosis, tmela tan pronto como sea posible. Si es casi la hora de su dosis siguiente, consulte a su mdico o a su profesional de Radiographer, therapeutic. Usted puede necesitar saltar una dosis o tomar una dosis adicional. No tome dosis dobles o adicionales sin asesoramiento.  Dnde debo guardar mi medicina?  Mantngala fuera del alcance de los nios.  Gurdela a [employer], entre 15 y 30 grados C (61 y 20 grados F). Protejla de la luz. Mantenga el envase bien cerrado. Deseche los medicamentos que no haya utilizado, despus de la fecha de vencimiento.  Qu le debo informar a mi profesional de la salud antes de tomar este medicamento?  Necesita saber si usted presenta alguno de los siguientes problemas o situaciones:   Sndrome de [place]   diabetes   glaucoma   enfermedad cardiaca   alta presin sangunea   infeccin (especialmente infecciones virales, como varicela o herpes)    enfermedad renal   enfermedad heptica   problemas mentales   miastenia gravis   osteoporosis   convulsiones   problemas estomacales o intestinales   enfermedad tiroidea   una reaccin alrgica o inusual a la lactosa, a la prednisona, a otros medicamentos, alimentos, colorantes o conservantes   si est embarazada o buscando quedar embarazada   si est amamantando a un beb  A qu debo estar atento al usar [employer]?  Visite a su mdico o a su profesional de la salud para que revise regularmente su evolucin. Si est [employer] por mucho tiempo, lleve una tarjeta de identificacin con su nombre y direccin, el tipo y la dosis del [place], y Administrator, Civil Service y la direccin de su mdico.  [place] medicamento puede aumentar su riesgo de contraer una infeccin. Informe a su mdico o a su profesional de la salud si est expuesto a cualquier persona con sarampin o varicela, o si desarrolla llagas o ampollas que no sanan correctamente.  Si le Zenaida Niece a Actor, informe a su mdico o profesional de la salud que ha tomado este [employer] ltimos doce meses.  Consulte a su mdico o a su profesional de la salud acerca de su dieta. Es posible que deba disminuir la cantidad de sal que come.  Este medicamento puede aumentar los niveles de Banker. Pregntele a su profesional de la salud si es Passenger transport manager cambios en la dieta o en los medicamentos si usted tiene diabetes.  NOTE:This sheet is a summary. It may not cover all possible information. If you have questions about this medicine, talk to your doctor, pharmacist, or health care provider. Copyright 2020 Elsevier        Contact Dermatitis  Contact dermatitis is a skin rash caused by something that touches the skin and makes it irritated and inflamed. Your skin may be red, swollen, dry, and may be cracked. Blisters may form and ooze. The rash will itch.   Contact dermatitis often forms on the face and neck, backs  of hands, forearms, genitals, and lower legs. But it can affect any area.   People can get contact dermatitis from lots of sources. These include:   Plants such as poison ivy, oak, or sumac   Chemicals in hair dyes and rinses, soaps, solvents, waxes, fingernail polish, and deodorants   Jewelry or watchbands made of nickel or cobalt  Contact dermatitis is not passed from person to person.  Talk with your healthcare  provider about what may have caused the rash. A type of allergy testing called "patch testing" may be used to discover what you are allergic to. You will need to stay away from the source of the rash in the future to prevent it from coming back.   Treatment is done to ease itching and prevent the rash from coming back. The rash should go away in a few days to a few weeks.   Home care  Your healthcare provider may prescribe medicine to ease swelling and itching. Follow all instructions when using these medicines.   General care   Stay away from anything that heats up your skin, such as hot showers or baths, or direct sunlight. This can make itching worse.   Apply cold compresses to soothe your sores to help ease your symptoms. Do this for 30 minutes 3 to 4 times a day. You can make a cold compress by soaking a cloth in cold water. Squeeze out excess water. You can add colloidal oatmeal to the water to help reduce itching. For severe itching in a small area, apply an ice pack wrapped in a thin towel. Do this for 20 minutes 3 to 4 times a day.   You can also try wet dressings. One way to do this is to wear a wet piece of clothing under a dry one. Wear a damp shirt under a dry shirt if your upper body is affected. This can relieve itching and prevent you from scratching the affected area.   You can also help ease large areas of itching by taking a lukewarm bath with colloidal oatmeal added to the water.   Use hydrocortisone cream for redness and irritation, unless another medicine was prescribed.  Calamine lotion can also relieve mild symptoms.   Use oral diphenhydramine to help reduce itching. You can buy this antihistamine at drugstores and grocery stores. It can make you sleepy, so use lower doses during the daytime. Don't use diphenhydramine if you have glaucoma or have trouble urinating because of an enlarged prostate.   If a plant causes your rash, make sure to wash your skin and the clothes you were wearing when you came into contact with the plant. This is to wash away the plant oils that gave you the rash and prevent more or worse symptoms. If you have a pet that's been outdoors, its fur may also have oil from the plant. Bathe your pet with soap or shampoo.   Stay away from the substance or object that causes your symptoms. If you cant stay away from it, wear gloves or some other type of protection    Follow-up care  Follow up with your healthcare provider, or as advised.  When to seek medical advice  Call your healthcare provider or seek medical attention right away if any of these occur:    Spreading of the rash to other parts of your body   Severe swelling of your face, eyelids, mouth, throat or tongue   Trouble urinating due to swelling in the genital area   Fever of 100.4F (38C) or higher, or as advised by your provider   Redness or swelling that gets worse   Pain that gets worse   Foul-smelling fluid leaking from the skin   Yellow-brown crusts on the open blisters  StayWell last reviewed this educational content on 04/29/2018   2000-2020 [employer], [place]. [address], [place], [place] [address]. All rights reserved. This information is not intended as a  substitute for professional medical care. Always follow your healthcare professional's instructions.

## 2020-06-20 ENCOUNTER — Encounter (INDEPENDENT_AMBULATORY_CARE_PROVIDER_SITE_OTHER): Payer: Self-pay

## 2020-06-20 ENCOUNTER — Ambulatory Visit (INDEPENDENT_AMBULATORY_CARE_PROVIDER_SITE_OTHER): Payer: Medicaid HMO | Admitting: Internal Medicine

## 2020-06-20 VITALS — HR 64 | Ht 72.0 in | Wt 230.0 lb

## 2020-06-20 DIAGNOSIS — U071 COVID-19: Secondary | ICD-10-CM

## 2020-06-20 DIAGNOSIS — Z20822 Contact with and (suspected) exposure to covid-19: Secondary | ICD-10-CM

## 2020-06-20 MED ORDER — GUAIFENESIN ER 600 MG PO TB12
1200.00 mg | ORAL_TABLET | Freq: Two times a day (BID) | ORAL | 0 refills | Status: DC
Start: 2020-06-20 — End: 2021-07-09

## 2020-06-20 MED ORDER — BUTALBITAL-APAP-CAFFEINE 50-325-40 MG PO TABS
1.0000 | ORAL_TABLET | ORAL | 0 refills | Status: DC | PRN
Start: 2020-06-20 — End: 2021-07-09

## 2020-06-20 NOTE — Progress Notes (Signed)
Subjective:    Patient ID: Clayton Davis is a 29 y.o. male. The patient was evaluated using telehealth platform. Exam findings are visual observation and/or having patient perform various physical exam tasks under my guidance. Verbal consent for evaluation was obtained.       HPI      Patient is a 29 year old gentleman who presents with sore throat cough. Patient has had some nausea and headache as well. His symptoms started about a day ago and he tested for Covid at home with home kit and found to be positive.  Patient did have a productive cough and some sensory or headaches.  He denies any shortness of breath or wheezing.  He feels feverish but has not taken his temperature.      The following portions of the patient's history were reviewed and updated as appropriate: allergies, current medications, past family history, past medical history, past social history, past surgical history and problem list.    Review of Systems   Constitutional: Positive for chills. Negative for fever.   HENT: Positive for congestion and sore throat.    Respiratory: Positive for cough.    Musculoskeletal: Positive for myalgias.   Neurological: Positive for headaches.         Objective:    Pulse 64    Ht 1.829 m (6')    Wt 104.3 kg (230 lb)    BMI 31.19 kg/m     Physical Exam  Vitals reviewed.   Constitutional:       Appearance: He is ill-appearing.   Eyes:      Conjunctiva/sclera: Conjunctivae normal.   Pulmonary:      Effort: Pulmonary effort is normal.   Neurological:      Mental Status: He is alert and oriented to person, place, and time.           Assessment and Plan:       Clayton Davis was seen today for sore throat, cough, nausea and headache.    Diagnoses and all orders for this visit:    COVID-19 virus infection    Other orders  -     butalbital-acetaminophen-caffeine (Esgic) 50-325-40 MG per tablet; Take 1 tablet by mouth every 4 (four) hours as needed for Headaches  -     guaiFENesin (Mucinex) 600 MG 12 hr tablet; Take 2 tablets  (1,200 mg total) by mouth 2 (two) times daily    Patient found to positive with home kit for Covid.  Given his symptoms do not doubt this and do not recommend him to be tested in-house.  Recommend quarantine for 10 days from onset of symptoms.  He may take some over-the-counter medications to help with his symptoms such as decongestants, antitussives and acetaminophen as needed.  Patient having severe headaches and can take some Esgic to help with his symptoms.  Recommend drinking plenty of fluids.  If his shortness of breath worsen and does not improve then would recommend going to the ER for further assessment.        Kyla Balzarine, MD  [facility]  06/20/2020  6:43 PM

## 2020-06-28 ENCOUNTER — Emergency Department
Admission: EM | Admit: 2020-06-28 | Discharge: 2020-06-29 | Disposition: A | Discharge status: Home / Self Care | Payer: Medicaid HMO | Attending: Emergency Medicine | Admitting: Emergency Medicine

## 2020-06-28 ENCOUNTER — Emergency Department: Payer: Medicaid HMO

## 2020-06-28 DIAGNOSIS — R112 Nausea with vomiting, unspecified: Secondary | ICD-10-CM

## 2020-06-28 DIAGNOSIS — R0981 Nasal congestion: Secondary | ICD-10-CM | POA: Insufficient documentation

## 2020-06-28 DIAGNOSIS — Z7189 Other specified counseling: Secondary | ICD-10-CM | POA: Insufficient documentation

## 2020-06-28 DIAGNOSIS — B349 Viral infection, unspecified: Secondary | ICD-10-CM | POA: Insufficient documentation

## 2020-06-28 LAB — CBC AND DIFFERENTIAL
Basophils %: 0.1 % (ref 0.0–3.0)
Basophils Absolute: 0 10*3/uL (ref 0.0–0.3)
Eosinophils %: 0.6 % (ref 0.0–7.0)
Eosinophils Absolute: 0 10*3/uL (ref 0.0–0.8)
Hematocrit: 44.3 % (ref 39.0–52.5)
Hemoglobin: 14.9 gm/dL (ref 13.0–17.5)
Lymphocytes Absolute: 0.9 10*3/uL (ref 0.6–5.1)
Lymphocytes: 18.6 % (ref 15.0–46.0)
MCH: 27 pg — ABNORMAL LOW (ref 28–35)
MCHC: 34 gm/dL (ref 32–36)
MCV: 81 fL (ref 80–100)
MPV: 8.4 fL (ref 6.0–10.0)
Monocytes Absolute: 0.5 10*3/uL (ref 0.1–1.7)
Monocytes: 10.3 % (ref 3.0–15.0)
Neutrophils %: 70.4 % (ref 42.0–78.0)
Neutrophils Absolute: 3.6 10*3/uL (ref 1.7–8.6)
PLT CT: 215 10*3/uL (ref 130–440)
RBC: 5.47 10*6/uL (ref 4.00–5.70)
RDW: 12 % (ref 11.0–14.0)
WBC: 5.1 10*3/uL (ref 4.0–11.0)

## 2020-06-28 LAB — BASIC METABOLIC PANEL
Anion Gap: 16.1 mMol/L (ref 7.0–18.0)
BUN / Creatinine Ratio: 15.4 Ratio (ref 10.0–30.0)
BUN: 12 mg/dL (ref 7–22)
CO2: 22 mMol/L (ref 20–30)
Calcium: 9.6 mg/dL (ref 8.5–10.5)
Chloride: 105 mMol/L (ref 98–110)
Creatinine: 0.78 mg/dL — ABNORMAL LOW (ref 0.80–1.30)
EGFR: 122 mL/min/{1.73_m2} (ref 60–150)
Glucose: 107 mg/dL — ABNORMAL HIGH (ref 71–99)
Osmolality Calculated: 278 mOsm/kg (ref 275–300)
Potassium: 4.1 mMol/L (ref 3.5–5.3)
Sodium: 139 mMol/L (ref 136–147)

## 2020-06-28 MED ORDER — ALPRAZOLAM 0.25 MG PO TABS
ORAL_TABLET | ORAL | Status: AC
Start: 2020-06-28 — End: ?
  Filled 2020-06-28: qty 1

## 2020-06-28 MED ORDER — SODIUM CHLORIDE 0.9 % IV BOLUS
1000.00 mL | Freq: Once | INTRAVENOUS | Status: AC
Start: 2020-06-28 — End: 2020-06-29
  Administered 2020-06-28: 1000 mL via INTRAVENOUS

## 2020-06-28 MED ORDER — ONDANSETRON HCL 4 MG/2ML IJ SOLN
INTRAMUSCULAR | Status: AC
Start: 2020-06-28 — End: ?
  Filled 2020-06-28: qty 2

## 2020-06-28 MED ORDER — KETOROLAC TROMETHAMINE 30 MG/ML IJ SOLN
30.00 mg | Freq: Once | INTRAMUSCULAR | Status: AC
Start: 2020-06-28 — End: 2020-06-28
  Administered 2020-06-28: 30 mg via INTRAVENOUS

## 2020-06-28 MED ORDER — ALPRAZOLAM 0.25 MG PO TABS
0.25 mg | ORAL_TABLET | Freq: Once | ORAL | Status: AC
Start: 2020-06-28 — End: 2020-06-28
  Administered 2020-06-28: 0.25 mg via ORAL

## 2020-06-28 MED ORDER — ONDANSETRON HCL 4 MG/2ML IJ SOLN
4.00 mg | Freq: Once | INTRAMUSCULAR | Status: AC
Start: 2020-06-28 — End: 2020-06-28
  Administered 2020-06-28: 4 mg via INTRAVENOUS

## 2020-06-28 MED ORDER — KETOROLAC TROMETHAMINE 30 MG/ML IJ SOLN
INTRAMUSCULAR | Status: AC
Start: 2020-06-28 — End: ?
  Filled 2020-06-28: qty 1

## 2020-06-28 NOTE — ED Provider Notes (Addendum)
EMERGENCY DEPARTMENT HISTORY AND PHYSICAL EXAM      Date Time: 06/29/20 11:26 PM  Patient Name: Clayton Davis  Attending Physician: Marvene Staff, MD    Assessment/Plan:     Viral syndrome  Shortness of breath  Known COVID-19 infection  Anxiety  Given IV NS, Xanax 0.25 mg, Decadron, Zofran  Rx Zofran, Decadron, Xanax 0.25 mg qhs prn        MDM     The patient's presentation is suggestive of a viral syndrome c/w with his known COVID infection. The patient is clinically well appearing. Bacterial or other serious etiologies like strep throat, pneumonia, PE were considered unlikely based upon the history and exam. There are no sepsis indicators. The patient appears stable for discharge and fever precautions were given.     Results of lab/radiology tests were reviewed and discussed with the patient and his wife. All questions were answered and concerns addressed. The patient appears appropriate for outpatient management with symptomatic treatment. Advised to return immediately for worsening symptoms or any concerns.      History of Presenting Illness:   History provided by:  Patient and review of record    Clayton Davis is a 29 y.o. male who is COVID-19 positive (tested using an at home test ~10 days ago). He presents today with chief complaints of breathing difficulty, "unbearable restlessness," hot flashes during inspiration/expiration, and anxiety. He reports that he is able to get a few seconds of relief from these symptoms when he closes his eyes and meditates.  He also complains of nasal congestion, sore throat, fatigue, myalgias, nausea, vomiting, and diarrhea. He states that he first began feeling sick over a week ago. He reports that his initial symptom was a migraine. He denies any known fever.      Past Medical History:     Past Medical History:   Diagnosis Date    Anxiety     Depression     No known health problems        Past Surgical History:     Past Surgical History:   Procedure  Laterality Date    NO PAST SURGERIES         Family History:     Family History   Problem Relation Age of Onset    No known problems Mother     No known problems Father     No known problems Sister     No known problems Brother     No known problems Maternal Aunt     No known problems Maternal Uncle     No known problems Paternal Aunt     No known problems Paternal Uncle     Cancer Maternal Grandmother     Diabetes Maternal Grandfather     No known problems Paternal Grandmother     No known problems Paternal Grandfather        Social History:     Social History     Socioeconomic History    Marital status: Married     Spouse name: Not on file    Number of children: Not on file    Years of education: Not on file    Highest education level: Not on file   Occupational History    Not on file   Tobacco Use    Smoking status: Former Smoker     Packs/day: 1.00     Years: 10.00     Pack years: 10.00     Types: Cigarettes  Quit date: 10/12/2016     Years since quitting: 3.7    Smokeless tobacco: Never Used    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   Substance and Sexual Activity    Alcohol use: Yes     Comment: Socially x once a month    Drug use: Never    Sexual activity: Yes   Other Topics Concern    Not on file   Social History Narrative    Not on file     Social Determinants of Health     Financial Resource Strain:     Difficulty of Paying Living Expenses:    Food Insecurity:     Worried About Programme researcher, broadcasting/film/video in the Last Year:     Barista in the Last Year:    Transportation Needs:     Freight forwarder (Medical):     Lack of Transportation (Non-Medical):    Physical Activity:     Days of Exercise per Week:     Minutes of Exercise per Session:    Stress:     Feeling of Stress :    Social Connections:     Frequency of Communication with Friends and Family:     Frequency of Social Gatherings with Friends and Family:     Attends Religious  Services:     Active Member of Clubs or Organizations:     Attends Banker Meetings:     Marital Status:    Intimate Partner Violence:     Fear of Current or Ex-Partner:     Emotionally Abused:     Physically Abused:     Sexually Abused:        Allergies:     Allergies   Allergen Reactions    Quetiapine Hives       Medications:     Previous Medications    BUPROPION XL (WELLBUTRIN XL) 150 MG 24 HR TABLET    TAKE 1 TABLET BY MOUTH EVERY DAY    BUSPIRONE (BUSPAR) 5 MG TABLET    Take 15 mg by mouth 2 (two) times daily       BUTALBITAL-ACETAMINOPHEN-CAFFEINE (ESGIC) 50-325-40 MG PER TABLET    Take 1 tablet by mouth every 4 (four) hours as needed for Headaches    FLUOXETINE (PROZAC) 10 MG CAPSULE    Take 20 mg by mouth every morning       GUAIFENESIN (MUCINEX) 600 MG 12 HR TABLET    Take 2 tablets (1,200 mg total) by mouth 2 (two) times daily    IBUPROFEN (ADVIL) 800 MG TABLET    Take 1 tablet (800 mg total) by mouth every 8 (eight) hours as needed for Pain    MAGIC MOUTHWASH (W/O NYSTATIN)    Take 15 mLs by mouth 4 (four) times daily as needed (sore throat) Maalox, liquid benadryl, viscous lidocaine 1:1:1 ratio, gargle/spit        Review of Systems:   Constitutional:  No known fever. Fatigue.  "Unbearable restlessness." Hot flashes during inspiration/expiration.    Nose: Congestion.      Throat:  Sore throat.      Respiratory: Breathing difficulty.    GI:  Nausea.  Vomiting.  Diarrhea.    Neurological:  Food tastes "awful"    Musculoskeletal:  Myalgias.    Psychiatric:  Anxiety.      All other systems reviewed and negative except as above, pertinent findings in HPI.  Physical Exam:   Blood pressure 131/82, pulse 73, temperature 98.4 F (36.9 C), temperature source Tympanic, resp. rate 20, height 1.803 m, weight 102.1 kg, SpO2 98 %.     Constitutional:  Vitals signs reviewed. Well-appearing.  Obese. No acute distress.    Head:  Atraumatic, normocephalic.    Eyes:  Pupils equal, round, and reactive  to light.  Conjunctiva no injection or erythema.    Ears: TMs normal.  Ear canals patent.      Nose:  No discharge.     Mouth & Throat:  Mucous membranes moist. No erythema.  No exudates.     Neck:  Supple, non tender. No cervical lymphadenopathy.    Respiratory:  Minimal bibasilar crackles.  No distress.    Chest:  Non-tender.    Cardiovascular:  Heart regular rate and rhythm.  No murmurs/gallops/rubs.  Pulses +2 bilaterally.    Abdomen:  Soft. Non-tender. No distension.     Back:  No CVA tenderness bilaterally.    Extremities:  Full range of motion.  No edema.  No cyanosis.  No deformity.    Skin:  Warm.  Dry.  No pallor.  No rashes.  No lesions.  No bruises.    Neurological:  Alert. Oriented to person, place, time.  GCS 15.  No focal motor deficits.  No sensory deficits.  Cranial Nerves II-XII intact.     Psychiatric:  Normal affect.  Anxious.  No depression.  No agitation.    Lab Results     Labs Reviewed   CBC AND DIFFERENTIAL - Abnormal; Notable for the following components:       Result Value    MCH 27 (*)     All other components within normal limits   BASIC METABOLIC PANEL - Abnormal; Notable for the following components:    Glucose 107 (*)     Creatinine 0.78 (*)     All other components within normal limits       Radiology Results     XR Chest AP Portable   Final Result   Mild interstitial densities in the right midlung and left base may represent early infiltrate. Otherwise no acute changes.      ReadingStation:[email or web address]          Labs and Radiological Studies Reviewed    Final Impression     Final diagnoses:   Acute viral syndrome   Advice given about COVID-19 virus infection       Disposition     ED Disposition     ED Disposition Condition Date/Time Comment    Discharge  Fri Jun 29, 2020  1:02 AM Clayton Davis discharge to home/self care.    Condition at disposition: Stable          Follow-Up Provider   [facility] Emergency Department  [address]  [place] [address]  [phone]    If  symptoms worsen          Marvene Staff, MD        ATTESTATIONS    The documentation recorded by my scribe, Adrian Prince, accurately reflects the services I personally performed and the decisions made by me.  Zakira Ressel Ames Dura, MD       Marvene Staff, MD  06/29/20 0102       Marvene Staff, MD  06/29/20 [phone]

## 2020-06-28 NOTE — Discharge Instructions (Signed)
Coronavirus Disease 2019 (COVID-19): Caring for Yourself or Others   If you or a household member have symptoms of COVID-19, follow these guidelines for preventing spread of the virus, and managing symptoms.   If you think you have COVID-19 symptoms   Stay home. Call your healthcare provider and tell them you have symptoms of COVID-19. Do this before going to any hospital or clinic. Follow your provider's instructions. You may be advised to isolate yourself at home. This is called self-isolation. You may also be told to stay at least 6 feet from others to prevent the spread of COVID-19. This is called "social distancing."   Stay away from work, school, and public places. Limit physical contact with family members. Limit visitors. Don't kiss anyone or share eating or drinking utensils. Clean surfaces you touch with disinfectant. This is to help prevent the virus from spreading.   If you need to cough or sneeze, do it into a tissue. Then throw the tissue into the trash. If you don't have tissues, cough or sneeze into the bend of your elbow.   Wear a cloth face mask with two or more layers of washable, breathable fabric while in public or when indoors with people who don't live with you. Or you can wear a disposable paper mask with a cloth mask on top. You can make a cloth face mask of your own. The CDC has instructions on how to make a face mask. Wear the mask so that it covers both your nose and mouth.   Don't share food or personal items with people in your household. This includes items like eating and drinking utensils, towels, and bedding.   If you need to go to a hospital or clinic, expect that the healthcare staff will wear protective equipment such as masks, gowns, gloves, and eye protection. You may be advised to wait in or enter through a separate area. This is to prevent the possible virus from spreading.   Tell the healthcare staff about recent travel. This includes local travel on public  transport. Staff may need to find other people you have been in contact with.   Follow all instructions the healthcare staff give you.    If you have been diagnosed with COVID-19   Stay home and start self-isolation. Don't leave your home unless you need to get medical care. Don't go to work, school, or public areas. Don't use public transportation or taxis.   Follow all instructions from your healthcare provider. Call your healthcare provider's office before going. They can prepare and give you instructions. This will help prevent the virus from spreading.   If you need to go to a hospital or clinic, expect that the healthcare staff will wear protective equipment such as masks, gowns, gloves, and eye protection. You may be advised to wait in or enter through a separate area. This is to prevent the possible virus from spreading.   Wear a face mask with 2 or more layers. Use either a cloth mask with layers of tightly woven, breathable fabric or a disposable paper mask with a cloth mask on top. This is to protect other people from your germs. If you are not able to wear a mask, your caregivers should. You can make a cloth face mask of your own. The CDC has instructions on how to make a face mask. Wear the mask so that it covers both your nose and mouth.   Stay away from other people in your home.     Avoid contact with pets and animals.   Don't share food or personal items with people in your household. This includes items like eating and drinking utensils, towels, and bedding.   If you need to cough or sneeze, do it into a tissue. Then throw the tissue into the trash. If you don't have tissues, cough or sneeze into the bend of your elbow.   Wash your hands often.    Self-care at home  The FDA has approved vaccines to prevent COVID-19 in people older than 18 (one vaccine has been approved for people as young as 16). Pregnant or breastfeeding people may choose to be vaccinated. Expert groups, including ACOG  and the CDC, advise pregnant or breastfeeding people to talk with their healthcare provider about being vaccinated.   The vaccines are being rolled out to the public in phases. Check your local health department about your community's roll-out plans. The vaccines are given as a shot (injection) in the arm muscle. A 1-dose or 2-dose vaccine may be given. If you get the 2-dose vaccine, the second dose is given several weeks after the first.   Current treatment is mainly aimed at helping your body while it fights the virus. This is known as supportive care. For serious COVID-19, you may need to stay in the hospital. Supportive care includes:    Getting rest. This helps your body fight the illness.   Staying hydrated.  Drinking liquids is the best way to prevent dehydration. Try to drink 6 to 8 glasses of liquids every day, or as advised by your provider. Also check with your provider about which fluids are best for you. Don't drink fluids that contain caffeine or alcohol.   Taking over-the-counter (OTC) pain medicine. These are used to help ease pain and reduce fever. Follow your healthcare provider's instructions for which OTC medicine to use.  If you've been treated for suspected or confirmed COVID-19 , follow all of your healthcare team's instructions. This will include when it's OK to stop self-isolation. You may also get instructions on position changes to help your breathing, such as lying on your belly (prone positioning). If you were treated at a hospital and discharged, you may be sent home with a pulse oximeter. This is a small electronic device that you clip on your fingertip. It measures the amount of oxygen in your body. Follow your healthcare team's instructions on its use, how they will be in touch with you, and when to call them.   The FDA recently approved monoclonal antibody therapy for emergency use in certain people who have a positive COVID-19 viral test and have mild to moderate symptoms but  are not in the hospital. It's not widely available and is still being investigated. It's approved for people 12 years and older who weigh about 88 pounds (40 kgs) and are at high risk for severe COVID-19 and a hospital stay. This includes people who are 65 years and older and people with certain chronic conditions. Monoclonal antibody therapy is not approved for people who:    Are in the hospital with COVID-19, or   Need oxygen therapy for COVID-19, or   Need oxygen therapy for a chronic condition and need to have oxygen flow increased because of COVID-19  If you've had confirmed COVID-19, your healthcare team may ask you to consider donating your plasma. This is called COVID-19 convalescent plasma donation. Plasma from people fully recovered from COVID-19 may contain antibodies to help fight COVID-19 in people who are   currently seriously ill with the disease. Experts don't know the safety of COVID-19 convalescent plasma or how well it works. Research continues. The FDA has approved it for emergency use in certain people with serious or life-threatening COVID-19.   Home care for a sick person   Follow all instructions from healthcare staff.   Wash your hands often.   Wear protective clothing as advised.   Make sure the sick person wears a mask. If they can't wear a mask, don't stay in the same room with the person. If you must be in the same room, wear a face mask. When wearing a mask, make sure that it covers both the nose and mouth.   Keep track of the sick person's symptoms.   Clean home surfaces often with disinfectant. This includes phones, kitchen counters, fridge door handle, bathroom surfaces, and others.   Don't let anyone share household items with the sick person. This includes eating and drinking tools, towels, sheets, or blankets.   Clean fabrics and laundry thoroughly.   Keep other people and pets away from the sick person.    When you can stop self-isolation  When you are sick with  COVID-19, you should stay away from other people. This is called self-isolation.   Your limits are different if you've had COVID-19 in the last 3 months but are fully recovered without symptoms and you have been exposed to someone with COVID-19. If you are symptom-free, you don't need to stay home away from others or be retested. The CDC doesn't recommend retesting unless you have symptoms of COVID-19 and your new symptoms can't be linked to another illness. Contact your healthcare provider if you have any questions. If you develop symptoms, stay home. If you had COVID-19 over 3 months ago and have been exposed again, treat it like you've never had COVID-19 and stay home, limit your contact with others, call your provider, and monitor for symptoms.   If you are normally healthy, the CDC does not advise retesting for COVID-19 with nose-throat swabs. You can stop self-isolation when all 3 of these are true:   1. You have had no fever for at least 24 hours. This means no fever without medicine that reduces fever, such as acetaminophen, for at least 24 hours.  2. Your symptoms such as cough or trouble breathing have improved.  3. It has been at least 10 days since your first symptoms started.  Talk with your healthcare provider before you leave home. Tell them if the 3 things above are true for you. They may tell you it's OK to leave home. In some cases, your state or local area may have specific advice. Your healthcare provider will tell you more.   If you have a weak immune system and COVID-19, or if you've had severe COVID-19,  your instructions on when to stop isolation will be somewhat different. Some conditions and treatments can cause a weak immune system. These include cancer treatment, bone marrow or organ transplants, and conditions such as HIV or other immune system disorders. You may be advised to stay home from 10 days to 20 days after your symptoms first started. Your healthcare provider may want to  retest you for COVID-19. Follow your provider's instructions.   When you return to public settings  When you are well enough to go outside your home, consider the CDC's guidance on cloth face masks:      The CDC advises all people over age 2 to wear cloth   face masks with 2 or more layers in public settings or at home when around people outside of their household, especially when it's hard to socially distance. For example, wear a face mask in populated places such as public transit, public protests and marches, and crowded stores, bars, and restaurants. Or when you are gathering indoors with people who don't live with you.   Cloth masks may help prevent people who have COVID-19 form spreading the virus to others.   Cloth masks are most likely to reduce COVID-19 spread when masks are widely used by people who are out in the public.    Certain people should not wear a face covering. This includes:    Children younger than 2 years old   Anyone with a health, developmental, or mental health condition that can be made worse by wearing a mask   Anyone who is unconscious or unable to remove the face covering without help. See the CDC's guidance on who should not wear a face mask.    When to call your healthcare provider  Call your healthcare provider right away if a sick person has any of these:    Trouble breathing   Pain or pressure in chest  If a sick person has any of these, call 911:   Trouble breathing that gets worse   Pain or pressure in chest that gets worse   Blue tint to lips or face   Fast or irregular heartbeat   Confusion or trouble waking   Fainting or loss of consciousness   Coughing up blood  Going home from the hospital   If you were diagnosed with COVID-19 and were recently discharged from the hospital:    Follow the instructions above for self-care and isolation.   Follow the hospital healthcare team's specific instructions.   Ask questions if anything is unclear to you. Write down  answers so you remember them.  Date last modified: 12/05/2019  [employer] last reviewed this educational content on 12/29/2018   2000-2021 [employer]. All rights reserved. This information is not intended as a substitute for professional medical care. Always follow your healthcare professional's instructions.

## 2020-06-29 MED ORDER — DEXAMETHASONE 6 MG PO TABS
3.0000 mg | ORAL_TABLET | Freq: Two times a day (BID) | ORAL | 0 refills | Status: DC
Start: 2020-06-29 — End: 2021-05-26

## 2020-06-29 MED ORDER — ONDANSETRON 8 MG PO TBDP
8.00 mg | ORAL_TABLET | Freq: Two times a day (BID) | ORAL | 0 refills | Status: DC | PRN
Start: 2020-06-29 — End: 2021-07-09

## 2020-06-29 MED ORDER — DEXAMETHASONE 4 MG PO TABS
ORAL_TABLET | ORAL | Status: AC
Start: 2020-06-29 — End: ?
  Filled 2020-06-29: qty 2

## 2020-06-29 MED ORDER — ALPRAZOLAM 0.25 MG PO TABS
0.2500 mg | ORAL_TABLET | Freq: Two times a day (BID) | ORAL | 0 refills | Status: DC | PRN
Start: 2020-06-29 — End: 2021-05-26

## 2020-06-29 MED ORDER — DEXAMETHASONE 4 MG PO TABS
6.00 mg | ORAL_TABLET | Freq: Once | ORAL | Status: AC
Start: 2020-06-29 — End: 2020-06-29
  Administered 2020-06-29: 01:00:00 6 mg via ORAL

## 2020-07-02 LAB — ECG 12-LEAD
P Wave Axis: 62 deg
P-R Interval: 126 ms
Patient Age: 28 years
Q-T Interval(Corrected): 442 ms
Q-T Interval: 390 ms
QRS Axis: 63 deg
QRS Duration: 91 ms
T Axis: 33 years
Ventricular Rate: 77 //min

## 2020-08-16 ENCOUNTER — Emergency Department
Admission: EM | Admit: 2020-08-16 | Discharge: 2020-08-16 | Disposition: A | Discharge status: Home / Self Care | Payer: Medicaid HMO | Attending: Emergency Medicine | Admitting: Emergency Medicine

## 2020-08-16 DIAGNOSIS — Z86711 Personal history of pulmonary embolism: Secondary | ICD-10-CM | POA: Insufficient documentation

## 2020-08-16 DIAGNOSIS — Z760 Encounter for issue of repeat prescription: Secondary | ICD-10-CM | POA: Insufficient documentation

## 2020-08-16 MED ORDER — APIXABAN 5 MG PO TABS
5.00 mg | ORAL_TABLET | Freq: Two times a day (BID) | ORAL | 0 refills | Status: AC
Start: 2020-08-16 — End: 2020-09-15

## 2020-08-16 NOTE — ED Notes (Signed)
Patient seen and assessed by Dr. Shea Evans.

## 2020-08-16 NOTE — Discharge Instructions (Signed)
Take Eliquis as prescribed  Follow-up with the [facility] transition center next week  Return to emergency department immediately as needed for chest pain, shortness of breath or new or worsening concerns

## 2020-08-16 NOTE — ED Provider Notes (Signed)
[facility]  EMERGENCY DEPARTMENT  History and Physical Exam       Patient Name: Clayton Davis, Clayton Davis  Encounter Date:  08/16/2020  Attending Physician: [place] Donnivan Villena, MD  PCP: Marisa Sprinkles, MD  Patient DOB:  1991/04/21  MRN:  [id number]  Room:  EH1/EH1-A      History of Presenting Illness     Chief complaint: Medication Refill    HPI/ROS is limited by: none  HPI/ROS given by: Patient    Clayton Davis is a 29 y.o. male who presents with a request for Eliquis refill. Pt reports that in the second week of October he had a pulmonary embolism and was prescribed eliquis. Pt recently moved to the area and does not have a PCP yet, so he was told to come into the ED for the medication refill. Pt has one day left on the eliquis. He has not missed any doses. Denies chest pain, abdominal pain, fever, SOB, cough, vomiting, diarrhea, nausea, sore throat, urinary sx, or headache.     Review of Systems     Review of Systems   Constitutional: Negative for fever.   HENT: Negative for sore throat.    Respiratory: Negative for cough and shortness of breath.    Cardiovascular: Negative for chest pain.   Gastrointestinal: Negative for abdominal pain, diarrhea, nausea and vomiting.   Genitourinary: Negative for dysuria and frequency.   Musculoskeletal: Negative for back pain.   Skin: Negative for rash.   Neurological: Negative for headaches.   Psychiatric/Behavioral: Negative for behavioral problems.        Allergies & Medications     Pt is allergic to quetiapine.    Home Meds: EMR link not correct; nurses' notes reviewed for meds and dosages     Past Medical History     Pt has a past medical history of Anxiety, Depression, and No known health problems.     Past Surgical History     Pt  has a past surgical history that includes No past surgeries.     Family History     The family history includes Cancer in his maternal grandmother; Diabetes in his maternal grandfather; No known problems in his brother, father, maternal aunt,  maternal uncle, mother, paternal aunt, paternal grandfather, paternal grandmother, paternal uncle, and sister.     Social History     Pt reports that he quit smoking about 3 years ago. His smoking use included cigarettes. He has a 10.00 pack-year smoking history. He has never used smokeless tobacco. He reports current alcohol use. He reports that he does not use drugs.     Physical Exam     Blood pressure 126/86, pulse (!) 104, temperature 97.5 F (36.4 C), temperature source Skin, resp. rate 20, height 1.8 m, weight 106 kg, SpO2 95 %.    Physical Exam  Vitals and nursing note reviewed.   Constitutional:       General: He is not in acute distress.     Appearance: He is well-developed. He is not ill-appearing, toxic-appearing or diaphoretic.   HENT:      Head: Normocephalic and atraumatic.   Eyes:      Pupils: Pupils are equal, round, and reactive to light.   Cardiovascular:      Rate and Rhythm: Normal rate and regular rhythm.      Heart sounds: Normal heart sounds. No murmur heard.      Pulmonary:      Effort: Pulmonary effort is normal. No respiratory  distress.      Breath sounds: Normal breath sounds. No wheezing or rales.   Abdominal:      General: Bowel sounds are normal. There is no distension.      Palpations: Abdomen is soft.      Tenderness: There is no abdominal tenderness.   Musculoskeletal:      Cervical back: Normal range of motion.   Skin:     General: Skin is warm and dry.      Capillary Refill: Capillary refill takes less than 2 seconds.   Neurological:      General: No focal deficit present.      Mental Status: He is alert and oriented to person, place, and time.   Psychiatric:         Mood and Affect: Mood normal.            Diagnostic Results     The results of the diagnostic studies below have been reviewed by myself:    Labs  Labs Reviewed - No data to display    Radiologic Studies  No results found.    EKG: none     ED Course & Treatment     Previous records reviewed.  D/dx, workup,  anticipated clinical course discussed with patient/family.  Patient appreciative of care.  All questions answered.  Patient/family comfortable with treatment plan.     1418 - Results explained.  All questions answered.  Pt is comfortable w/ disposition.    29 year old male with history of recent pulmonary embolus presents requesting refill of Eliquis.  He recently moved to our area.  He has not missed any doses, but only has 1 dose left.  He denies any symptoms.  He was discharged home with prescription for Eliquis.  Recommend outpatient follow-up with the [facility] center.       Medical Decision Making     DDx: Medication refill, history of pulmonary bolus    This chart was generated by an EMR, and parts may have been dictated through an electronic transcription program, and so this chart may contain errors or omissions not intended by the user.     Procedures / Critical Care     None     Diagnosis / Disposition     Clinical Impression  1. Medication refill    2. History of pulmonary embolus (PE)        Disposition  ED Disposition     ED Disposition Condition Date/Time Comment    Discharge  Thu Aug 16, 2020  2:17 PM Roselyn Bering discharge to home/self care.    Condition at disposition: Stable          Follow up for Discharged Patients  [facility]  [address]  [place] [address]  [phone]  Call today  to arrange follow up appointment for next week    [facility]  [address]  [place] [address]  [phone]    As needed, If symptoms worsen      Prescriptions for Discharged Patients  Discharge Medication List as of 08/16/2020  2:17 PM      START taking these medications    Details   apixaban (ELIQUIS) 5 MG Take 1 tablet (5 mg total) by mouth every 12 (twelve) hours, Starting Thu 08/16/2020, Until Sat 09/15/2020, E-Rx             Eliot Ford was my scribe today and assisted me with documentation only. She  was present during the  questioning and physical examination of this patient and documented what he observed and was instructed to document. This note accurately reflects work and decisions made by me.              Tajon Moring, [place] B, MD  08/16/20 [phone]

## 2021-01-14 ENCOUNTER — Ambulatory Visit (INDEPENDENT_AMBULATORY_CARE_PROVIDER_SITE_OTHER): Payer: 59 | Admitting: Physician Assistant

## 2021-01-14 ENCOUNTER — Encounter (INDEPENDENT_AMBULATORY_CARE_PROVIDER_SITE_OTHER): Payer: Self-pay | Admitting: Physician Assistant

## 2021-01-14 VITALS — BP 122/80 | HR 92 | Temp 97.2°F | Resp 16 | Ht 71.0 in | Wt 232.2 lb

## 2021-01-14 DIAGNOSIS — R5383 Other fatigue: Secondary | ICD-10-CM

## 2021-01-14 DIAGNOSIS — Z86711 Personal history of pulmonary embolism: Secondary | ICD-10-CM

## 2021-01-14 DIAGNOSIS — Z0000 Encounter for general adult medical examination without abnormal findings: Secondary | ICD-10-CM

## 2021-01-14 DIAGNOSIS — R6882 Decreased libido: Secondary | ICD-10-CM

## 2021-01-14 DIAGNOSIS — F418 Other specified anxiety disorders: Secondary | ICD-10-CM

## 2021-01-14 DIAGNOSIS — E669 Obesity, unspecified: Secondary | ICD-10-CM

## 2021-01-14 DIAGNOSIS — F32A Depression, unspecified: Secondary | ICD-10-CM | POA: Insufficient documentation

## 2021-01-14 DIAGNOSIS — F419 Anxiety disorder, unspecified: Secondary | ICD-10-CM | POA: Insufficient documentation

## 2021-01-14 DIAGNOSIS — Z1329 Encounter for screening for other suspected endocrine disorder: Secondary | ICD-10-CM

## 2021-01-14 DIAGNOSIS — Z13228 Encounter for screening for other metabolic disorders: Secondary | ICD-10-CM

## 2021-01-14 DIAGNOSIS — Z6832 Body mass index (BMI) 32.0-32.9, adult: Secondary | ICD-10-CM

## 2021-01-14 DIAGNOSIS — Z130 Encounter for screening for diseases of the blood and blood-forming organs and certain disorders involving the immune mechanism: Secondary | ICD-10-CM

## 2021-01-14 DIAGNOSIS — Z8616 Personal history of COVID-19: Secondary | ICD-10-CM

## 2021-01-14 NOTE — Progress Notes (Signed)
[facility]  [address]  [place], [place], [address]  [phone]  01/14/2021       Patient:   Clayton Davis                                                  CSN:        [id number]                                          DOB:       02-10-1991                                                    MRN:        [id number]       SUBJECTIVE     HPI: Patient is a 30 y.o. male who comes in to establish care.  Due to lack of documented annual physical in chart, need to evaluate patient's acute concerns/chronic conditions in addition to addressing health maintenance/preventative care gaps at today's visit.  Previous PCP: Jenel Lucks, NP  Reason for transferring: Provider leaving practice  Other medical providers/specialists: Psychiatrist  Lives with: Wife  Occupation: Landscape architect    Current Concerns / Chronic Conditions:   H/o Pulmonary Embolism  Oct 2021 - After recent hospitalization for Covid19, no h/o blood clots prior to this, no FHx bleeding/clotting disorder.  Was on Eliquis (apixaban) 5mg  PO Q12hrs, ran out in Jan/Feb 2022.    Weight gain / Fatigue / Decreased Libido  Gradual over the past couple of years    Depression / Anxiety / ADHD  Pt follows with Psychiatry.  Current Medications: Xanax, Adderall, Wellbutrin, BuSpar, Prozac.    Health Maintenance:  Last labs: Oct 2021  Last HIV/Syphilis/Hepatitis C screenings: Negative in the past    Tdap: Up-to-date (adm. 2017)  Flu:  Declined  Covid19 Vaccine: Up-to-date    Diet: Meat, Seafood, Vegetable, Starch, Tries to limit sugar intake  Drinks a lot of milk, but is cutting back to reduce gassiness  Water: Good and bad days  Caffeine: Tries to limit, only zero-calorie sodas.  Alcohol: None  Tobacco: Vapes daily   Drugs: No      Exercise: Stays active, "not a gym rat"  Sleep: does not have trouble falling asleep  or staying asleep.  Sexually Active: Yes (monogamous with male partner)    Past Surgical History:   Procedure Laterality Date    NO PAST SURGERIES       Family History   Problem Relation Age of Onset    No known problems Mother     No known problems Father     No known problems Sister     No known problems Brother     No known problems Maternal Aunt     No known problems Maternal Uncle     No known problems Paternal Aunt     No known problems Paternal Uncle     Cancer Maternal Grandmother     Diabetes Maternal Grandfather     No known problems  Paternal Grandmother     No known problems Paternal Grandfather      Social History     Social History Narrative    Not on file     Outpatient Medications Marked as Taking for the 01/14/21 encounter (Office Visit) with Hillery Jacks, PA   Medication Sig Dispense Refill    ALPRAZolam (XANAX) 0.25 MG tablet Take 1 tablet (0.25 mg total) by mouth 2 (two) times daily as needed for Sleep or Anxiety 8 tablet 0    buPROPion XL (WELLBUTRIN XL) 150 MG 24 hr tablet TAKE 1 TABLET BY MOUTH EVERY DAY 90 tablet 1    busPIRone (BUSPAR) 5 MG tablet Take 15 mg by mouth 2 (two) times daily         butalbital-acetaminophen-caffeine (Esgic) 50-325-40 MG per tablet Take 1 tablet by mouth every 4 (four) hours as needed for Headaches 30 tablet 0    dexAMETHasone (DECADRON) 6 MG tablet Take 0.5 tablets (3 mg total) by mouth every 12 (twelve) hours 5 tablet 0    FLUoxetine (PROzac) 10 MG capsule Take 20 mg by mouth every morning         ibuprofen (ADVIL) 800 MG tablet Take 1 tablet (800 mg total) by mouth every 8 (eight) hours as needed for Pain 21 tablet 0     Allergies   Allergen Reactions    Quetiapine Hives     Review of Systems   Constitutional: Negative for appetite change, chills, fatigue and fever.   HENT: Negative for congestion, ear pain, sinus pressure, sneezing, sore throat and trouble swallowing.    Eyes: Negative for pain.   Respiratory: Negative for cough  and shortness of breath.    Cardiovascular: Negative for chest pain, palpitations and leg swelling.   Gastrointestinal: Negative for abdominal pain, diarrhea, nausea and vomiting.   Genitourinary: Negative for difficulty urinating and dysuria.   Musculoskeletal: Negative for arthralgias and myalgias.   Skin: Negative for rash.   Neurological: Negative for seizures, syncope and headaches.       PHYSICAL EXAM     BP 122/80 (BP Site: Left arm, Patient Position: Sitting, Cuff Size: Large)    Pulse 92    Temp 97.2 F (36.2 C) (Temporal)    Resp 16    Ht 1.803 m (5\' 11" )    Wt 105.3 kg (232 lb 3.2 oz)    SpO2 98%    BMI 32.39 kg/m   Physical Exam  Vitals and nursing note reviewed.   Constitutional:       General: He is not in acute distress.     Appearance: Normal appearance. He is obese. He is not ill-appearing, toxic-appearing or diaphoretic.   HENT:      Head: Normocephalic and atraumatic.      Right Ear: External ear normal.      Left Ear: External ear normal.      Nose: No congestion.      Mouth/Throat:      Mouth: Mucous membranes are moist.   Eyes:      Conjunctiva/sclera: Conjunctivae normal.   Cardiovascular:      Rate and Rhythm: Normal rate and regular rhythm.      Pulses: Normal pulses.      Heart sounds: Normal heart sounds. No murmur heard.    No friction rub. No gallop.   Pulmonary:      Effort: Pulmonary effort is normal. No respiratory distress.      Breath sounds: Normal breath sounds. No  wheezing, rhonchi or rales.   Abdominal:      General: Bowel sounds are normal.      Palpations: Abdomen is soft.      Tenderness: There is no abdominal tenderness. There is no right CVA tenderness or left CVA tenderness.   Musculoskeletal:      Cervical back: Normal range of motion and neck supple. No rigidity or tenderness.      Right lower leg: No edema.      Left lower leg: No edema.      Comments: Patient ambulates independently without assistive device, without obvious deformity/deficit of extremities x4.    Lymphadenopathy:      Cervical: No cervical adenopathy.   Skin:     General: Skin is warm and dry.      Findings: No rash.   Neurological:      Mental Status: He is alert and oriented to person, place, and time.      Sensory: No sensory deficit.      Motor: No weakness.      Coordination: Coordination normal.      Gait: Gait normal.   Psychiatric:         Mood and Affect: Mood normal.         Behavior: Behavior normal.         Thought Content: Thought content normal.         Judgment: Judgment normal.       ASSESSMENT and PLAN     1. Annual physical exam     2. History of pulmonary embolism  CBC and differential   3. History of COVID-19     4. Depression, unspecified depression type  TSH, Abn Reflex to Free T4, Serum   5. Anxiety     6. Fatigue, unspecified type  CBC and differential    Comprehensive metabolic panel    TSH, Abn Reflex to Free T4, Serum   7. Decreased libido  Comprehensive metabolic panel    Hemoglobin A1C    Lipid panel   8. Class 1 obesity with body mass index (BMI) of 32.0 to 32.9 in adult, unspecified obesity type, unspecified whether serious comorbidity present  Comprehensive metabolic panel    Hemoglobin A1C    Lipid panel   9. Screening for blood disease  CBC and differential   10. Screening for metabolic disorder  Comprehensive metabolic panel    Hemoglobin A1C    Lipid panel   11. Screening for thyroid disorder  TSH, Abn Reflex to Free T4, Serum     1. Previous Records reviewed as available.  2. Lifestyle recommendations: Continue to decrease lactose intake, due to direct association with gassiness/GI upset; discussed using Lactaid when patient is unable to avoid lactose consumption.  3. Medication changes:   1. START n/a   2. CONTINUE Xanax, Wellbutrin, Adderall, BuSpar, Prozac as prescribed by psychiatrist   3. STOP n/a  4. Labs/Tests ordered: As above  5. Referrals: None today, continue regular follow-up with psychiatrist.  6. Counseled on likelihood that weight gain, decreased libido,  fatigue could be side effects of one or multiple mental health medications, and patient should discuss this possibility with his psychiatrist.  7. Follow up in 1 year(s), sooner if needed.    Hillery Jacks, PA-C    This note was completed using dragon medical speech recognition software. Grammatical errors, random word insertions, pronoun errors, incorrect word insertion, misspellings  and incomplete sentences are occasional consequences of this technology due to software  limitations. If there are questions or concerns about the content of this note or information contained within the body of this dictation they should be addressed with the provider for clarification.

## 2021-01-23 ENCOUNTER — Other Ambulatory Visit
Admission: RE | Admit: 2021-01-23 | Discharge: 2021-01-23 | Disposition: A | Discharge status: Home / Self Care | Payer: 59 | Source: Ambulatory Visit | Attending: Physician Assistant | Admitting: Physician Assistant

## 2021-01-23 ENCOUNTER — Other Ambulatory Visit (INDEPENDENT_AMBULATORY_CARE_PROVIDER_SITE_OTHER): Payer: 59

## 2021-01-23 DIAGNOSIS — Z86711 Personal history of pulmonary embolism: Secondary | ICD-10-CM

## 2021-01-23 DIAGNOSIS — R6882 Decreased libido: Secondary | ICD-10-CM

## 2021-01-23 DIAGNOSIS — Z13228 Encounter for screening for other metabolic disorders: Secondary | ICD-10-CM

## 2021-01-23 DIAGNOSIS — Z1329 Encounter for screening for other suspected endocrine disorder: Secondary | ICD-10-CM

## 2021-01-23 DIAGNOSIS — Z6832 Body mass index (BMI) 32.0-32.9, adult: Secondary | ICD-10-CM

## 2021-01-23 DIAGNOSIS — R5383 Other fatigue: Secondary | ICD-10-CM

## 2021-01-23 DIAGNOSIS — Z130 Encounter for screening for diseases of the blood and blood-forming organs and certain disorders involving the immune mechanism: Secondary | ICD-10-CM

## 2021-01-23 DIAGNOSIS — E669 Obesity, unspecified: Secondary | ICD-10-CM

## 2021-01-23 DIAGNOSIS — F32A Depression, unspecified: Secondary | ICD-10-CM

## 2021-01-23 LAB — COMPREHENSIVE METABOLIC PANEL
ALT: 14 U/L (ref 0–55)
AST (SGOT): 25 U/L (ref 10–42)
Albumin/Globulin Ratio: 1.21 Ratio (ref 0.80–2.00)
Albumin: 4.1 gm/dL (ref 3.5–5.0)
Alkaline Phosphatase: 45 U/L (ref 40–145)
Anion Gap: 18.3 mMol/L — ABNORMAL HIGH (ref 7.0–18.0)
BUN / Creatinine Ratio: 17.3 Ratio (ref 10.0–30.0)
BUN: 17 mg/dL (ref 7–22)
Bilirubin, Total: 0.9 mg/dL (ref 0.1–1.2)
CO2: 25 mMol/L (ref 20–30)
Calcium: 9.8 mg/dL (ref 8.5–10.5)
Chloride: 101 mMol/L (ref 98–110)
Creatinine: 0.98 mg/dL (ref 0.80–1.30)
EGFR: 103 mL/min/{1.73_m2} (ref 60–150)
Globulin: 3.4 gm/dL (ref 2.0–4.0)
Glucose: 103 mg/dL — ABNORMAL HIGH (ref 71–99)
Osmolality Calculated: 281 mOsm/kg (ref 275–300)
Potassium: 4.3 mMol/L (ref 3.5–5.3)
Protein, Total: 7.5 gm/dL (ref 6.0–8.3)
Sodium: 140 mMol/L (ref 136–147)

## 2021-01-23 LAB — CBC AND DIFFERENTIAL
Basophils %: 0.5 % (ref 0.0–3.0)
Basophils Absolute: 0 10*3/uL (ref 0.0–0.3)
Eosinophils %: 4.6 % (ref 0.0–7.0)
Eosinophils Absolute: 0.4 10*3/uL (ref 0.0–0.8)
Hematocrit: 46.8 % (ref 39.0–52.5)
Hemoglobin: 14.6 gm/dL (ref 13.0–17.5)
Lymphocytes Absolute: 3.9 10*3/uL (ref 0.6–5.1)
Lymphocytes: 41.4 % (ref 15.0–46.0)
MCH: 26 pg — ABNORMAL LOW (ref 28–35)
MCHC: 31 gm/dL — ABNORMAL LOW (ref 32–36)
MCV: 83 fL (ref 80–100)
MPV: 8.5 fL (ref 6.0–10.0)
Monocytes Absolute: 0.8 10*3/uL (ref 0.1–1.7)
Monocytes: 8.7 % (ref 3.0–15.0)
Neutrophils %: 44.9 % (ref 42.0–78.0)
Neutrophils Absolute: 4.2 10*3/uL (ref 1.7–8.6)
PLT CT: 309 10*3/uL (ref 130–440)
RBC: 5.62 10*6/uL (ref 4.00–5.70)
RDW: 12 % (ref 11.0–14.0)
WBC: 9.4 10*3/uL (ref 4.0–11.0)

## 2021-01-23 LAB — LIPID PANEL
Cholesterol: 217 mg/dL — ABNORMAL HIGH (ref 75–199)
Coronary Heart Disease Risk: 6.58
HDL: 33 mg/dL — ABNORMAL LOW (ref 40–55)
LDL Calculated: 148 mg/dL
Triglycerides: 181 mg/dL — ABNORMAL HIGH (ref 10–150)
VLDL: 36 (ref 0–40)

## 2021-01-23 LAB — HEMOGLOBIN A1C: Hgb A1C, %: 5.3 %

## 2021-01-23 LAB — THYROID STIMULATING HORMONE (TSH), REFLEX ON ABNORMAL TO FREE T4, SERUM: TSH: 3.34 u[IU]/mL (ref 0.40–4.20)

## 2021-01-24 ENCOUNTER — Other Ambulatory Visit (INDEPENDENT_AMBULATORY_CARE_PROVIDER_SITE_OTHER): Payer: Self-pay | Admitting: Physician Assistant

## 2021-01-24 MED ORDER — ATORVASTATIN CALCIUM 40 MG PO TABS
40.0000 mg | ORAL_TABLET | Freq: Every day | ORAL | 3 refills | Status: AC
Start: 2021-01-24 — End: 2022-01-24

## 2021-05-17 ENCOUNTER — Emergency Department
Admission: EM | Admit: 2021-05-17 | Discharge: 2021-05-18 | Discharge status: Against Medical Advice | Payer: PRIVATE HEALTH INSURANCE | Attending: Emergency Medicine | Admitting: Emergency Medicine

## 2021-05-17 ENCOUNTER — Emergency Department: Payer: PRIVATE HEALTH INSURANCE

## 2021-05-17 DIAGNOSIS — Z5329 Procedure and treatment not carried out because of patient's decision for other reasons: Secondary | ICD-10-CM | POA: Insufficient documentation

## 2021-05-17 DIAGNOSIS — R69 Illness, unspecified: Secondary | ICD-10-CM | POA: Insufficient documentation

## 2021-05-18 ENCOUNTER — Emergency Department
Admission: EM | Admit: 2021-05-18 | Discharge: 2021-05-18 | Disposition: A | Discharge status: Home / Self Care | Payer: PRIVATE HEALTH INSURANCE | Attending: Emergency Medicine | Admitting: Emergency Medicine

## 2021-05-18 ENCOUNTER — Emergency Department: Payer: PRIVATE HEALTH INSURANCE

## 2021-05-18 DIAGNOSIS — W541XXA Struck by dog, initial encounter: Secondary | ICD-10-CM | POA: Insufficient documentation

## 2021-05-18 DIAGNOSIS — S39012A Strain of muscle, fascia and tendon of lower back, initial encounter: Secondary | ICD-10-CM | POA: Insufficient documentation

## 2021-05-18 DIAGNOSIS — W19XXXA Unspecified fall, initial encounter: Secondary | ICD-10-CM

## 2021-05-18 DIAGNOSIS — S46911A Strain of unspecified muscle, fascia and tendon at shoulder and upper arm level, right arm, initial encounter: Secondary | ICD-10-CM | POA: Insufficient documentation

## 2021-05-18 DIAGNOSIS — S40011A Contusion of right shoulder, initial encounter: Secondary | ICD-10-CM | POA: Insufficient documentation

## 2021-05-18 DIAGNOSIS — S300XXA Contusion of lower back and pelvis, initial encounter: Secondary | ICD-10-CM | POA: Insufficient documentation

## 2021-05-18 DIAGNOSIS — Y93K1 Activity, walking an animal: Secondary | ICD-10-CM | POA: Insufficient documentation

## 2021-05-18 MED ORDER — IBUPROFEN 600 MG PO TABS
ORAL_TABLET | ORAL | Status: AC
Start: 2021-05-18 — End: ?
  Filled 2021-05-18: qty 1

## 2021-05-18 MED ORDER — LIDOCAINE 5 % EX PTCH
1.0000 | MEDICATED_PATCH | CUTANEOUS | 0 refills | Status: AC
Start: 2021-05-18 — End: ?

## 2021-05-18 MED ORDER — LIDOCAINE 5 % EX PTCH
MEDICATED_PATCH | CUTANEOUS | Status: AC
Start: 2021-05-18 — End: ?
  Filled 2021-05-18: qty 2

## 2021-05-18 MED ORDER — IBUPROFEN 600 MG PO TABS
600.0000 mg | ORAL_TABLET | Freq: Four times a day (QID) | ORAL | 0 refills | Status: DC | PRN
Start: 2021-05-18 — End: 2021-07-09

## 2021-05-18 MED ORDER — HYDROCODONE-ACETAMINOPHEN 5-325 MG PO TABS
1.0000 | ORAL_TABLET | Freq: Once | ORAL | Status: AC
Start: 2021-05-18 — End: 2021-05-18
  Administered 2021-05-18: 17:00:00 1 via ORAL

## 2021-05-18 MED ORDER — LIDOCAINE 5 % EX PTCH
2.0000 | MEDICATED_PATCH | Freq: Once | CUTANEOUS | Status: DC
Start: 2021-05-18 — End: 2021-05-18
  Administered 2021-05-18: 17:00:00 2 via TRANSDERMAL

## 2021-05-18 MED ORDER — HYDROCODONE-ACETAMINOPHEN 5-325 MG PO TABS
ORAL_TABLET | ORAL | Status: AC
Start: 2021-05-18 — End: ?
  Filled 2021-05-18: qty 1

## 2021-05-18 MED ORDER — ACETAMINOPHEN 325 MG PO TABS
650.0000 mg | ORAL_TABLET | Freq: Four times a day (QID) | ORAL | 0 refills | Status: DC | PRN
Start: 2021-05-18 — End: 2021-07-18

## 2021-05-18 MED ORDER — IBUPROFEN 600 MG PO TABS
600.0000 mg | ORAL_TABLET | Freq: Once | ORAL | Status: AC
Start: 2021-05-18 — End: 2021-05-18
  Administered 2021-05-18: 17:00:00 600 mg via ORAL

## 2021-05-18 NOTE — ED Notes (Signed)
Called multiple times

## 2021-05-18 NOTE — ED Provider Notes (Signed)
History     Chief Complaint   Patient presents with    Fall     Patient is here as states 2 days ago he was walking his new dog.  Patient states that over the top of stairs and the dog saw a deer and took off pulling him down with it.  Patient states since then he has had essentially whole body aches but mostly in his right shoulder and his left lower back.    The history is provided by the patient. No language interpreter was used.   Fall  The accident occurred 2 days ago. The fall occurred while standing. He fell from a height of 6 to 10 ft. He landed on A hard floor. The point of impact was the right shoulder. The pain is at a severity of 5/10. The pain is mild. He was Ambulatory at the scene. There was No entrapment after the fall. There was No drug use involved in the accident. There was No alcohol use involved in the accident. Pertinent negatives include no visual change, no fever, no numbness, no abdominal pain, no nausea, no vomiting, no headaches and no tingling. The symptoms are aggravated by activity. He has tried nothing for the symptoms. The treatment provided no relief.      Past Medical History:   Diagnosis Date    Anxiety     Depression     No known health problems        Past Surgical History:   Procedure Laterality Date    NO PAST SURGERIES         Family History   Problem Relation Age of Onset    No known problems Mother     No known problems Father     No known problems Sister     No known problems Brother     No known problems Maternal Aunt     No known problems Maternal Uncle     No known problems Paternal Aunt     No known problems Paternal Uncle     Cancer Maternal Grandmother     Diabetes Maternal Grandfather     No known problems Paternal Grandmother     No known problems Paternal Grandfather        Social  Social History     Tobacco Use    Smoking status: Former     Packs/day: 1.00     Years: 10.00     Pack years: 10.00     Types: Cigarettes     Quit date: 10/12/2016     Years since  quitting: 4.6    Smokeless tobacco: Never    Tobacco comments:     social   Vaping Use    Vaping Use: Every day   Substance Use Topics    Alcohol use: Yes     Comment: Socially x once a month    Drug use: Never       .     Allergies   Allergen Reactions    Quetiapine Hives       Home Medications               ALPRAZolam (XANAX) 0.25 MG tablet     Take 1 tablet (0.25 mg total) by mouth 2 (two) times daily as needed for Sleep or Anxiety     amphetamine-dextroamphetamine (ADDERALL) 15 MG tablet     Take 15 mg by mouth daily     atorvastatin (Lipitor) 40 MG tablet  Take 1 tablet (40 mg total) by mouth daily     buPROPion XL (WELLBUTRIN XL) 150 MG 24 hr tablet     TAKE 1 TABLET BY MOUTH EVERY DAY     busPIRone (BUSPAR) 5 MG tablet     Take 15 mg by mouth 2 (two) times daily        butalbital-acetaminophen-caffeine (Esgic) 50-325-40 MG per tablet     Take 1 tablet by mouth every 4 (four) hours as needed for Headaches     dexAMETHasone (DECADRON) 6 MG tablet     Take 0.5 tablets (3 mg total) by mouth every 12 (twelve) hours     FLUoxetine (PROzac) 10 MG capsule     Take 20 mg by mouth every morning        guaiFENesin (Mucinex) 600 MG 12 hr tablet     Take 2 tablets (1,200 mg total) by mouth 2 (two) times daily     ibuprofen (ADVIL) 800 MG tablet     Take 1 tablet (800 mg total) by mouth every 8 (eight) hours as needed for Pain     Magic Mouthwash (w/o nystatin)     Take 15 mLs by mouth 4 (four) times daily as needed (sore throat) Maalox, liquid benadryl, viscous lidocaine 1:1:1 ratio, gargle/spit     ondansetron (Zofran ODT) 8 MG disintegrating tablet     Take 1 tablet (8 mg total) by mouth every 12 (twelve) hours as needed for Nausea             Review of Systems   Constitutional:  Negative for activity change and fever.   HENT:  Negative for congestion, facial swelling and hearing loss.    Eyes:  Negative for photophobia, discharge and visual disturbance.   Respiratory:  Negative for apnea, chest tightness, shortness  of breath and wheezing.    Cardiovascular:  Negative for chest pain, palpitations and leg swelling.   Gastrointestinal:  Negative for abdominal distention, abdominal pain, nausea and vomiting.   Genitourinary:  Negative for flank pain and frequency.   Musculoskeletal:  Negative for arthralgias, joint swelling and neck stiffness.   Skin:  Negative for rash.   Neurological:  Negative for dizziness, tingling, speech difficulty, weakness, numbness and headaches.   Psychiatric/Behavioral:  Negative for agitation.      Physical Exam    BP: 101/67, Heart Rate: (!) 109, Temp: 97.7 F (36.5 C), Resp Rate: 20, SpO2: 95 %, Weight: 93.8 kg    Physical Exam  Vitals and nursing note reviewed.   Constitutional:       General: He is not in acute distress.     Appearance: Normal appearance.   HENT:      Head: Normocephalic and atraumatic.      Right Ear: External ear normal.      Left Ear: External ear normal.      Nose: Nose normal.      Mouth/Throat:      Mouth: Mucous membranes are moist.      Pharynx: Oropharynx is clear.   Eyes:      Extraocular Movements: Extraocular movements intact.      Conjunctiva/sclera: Conjunctivae normal.      Pupils: Pupils are equal, round, and reactive to light.   Cardiovascular:      Rate and Rhythm: Normal rate and regular rhythm.   Pulmonary:      Effort: Pulmonary effort is normal. No respiratory distress.   Abdominal:      General: Abdomen is  flat. There is no distension.      Palpations: Abdomen is soft.   Musculoskeletal:         General: No swelling or signs of injury. Normal range of motion.      Cervical back: Normal range of motion and neck supple.      Comments: Pain on ROM of R shoulder, no sign of external trauma, pain on palpation of L lower lateral lumbar area, no midline tenderness or signs of external trauma   Skin:     General: Skin is warm.      Capillary Refill: Capillary refill takes less than 2 seconds.      Findings: No rash.   Neurological:      General: No focal deficit  present.      Mental Status: He is alert and oriented to person, place, and time. Mental status is at baseline.   Psychiatric:         Mood and Affect: Mood normal.         Behavior: Behavior normal.         Thought Content: Thought content normal.         MDM and ED Course     ED Medication Orders (From admission, onward)      Start Ordered     Status Ordering Provider    05/18/21 1638 05/18/21 1637  lidocaine (LIDODERM) 5 % 2 patch  Once        Route: Transdermal  Ordered Dose: 2 patch     Last MAR action: Patch Applied Zanden Colver    05/18/21 1638 05/18/21 1637  ibuprofen (ADVIL) tablet 600 mg  Once in ED        Route: Oral  Ordered Dose: 600 mg     Last MAR action: Given Alajiah Dutkiewicz    05/18/21 1638 05/18/21 1637  HYDROcodone-acetaminophen (NORCO) 5-325 MG per tablet 1 tablet  Once in ED        Route: Oral  Ordered Dose: 1 tablet     Last MAR action: Given Kerianne Gurr               MDM  Number of Diagnoses or Management Options  Acute myofascial strain of lumbosacral region, initial encounter: new and requires workup  Contusion of right shoulder, initial encounter: new and requires workup  Fall, initial encounter: new and requires workup  Lumbar contusion, initial encounter: new and requires workup  Strain of right shoulder, initial encounter: new and requires workup  Diagnosis management comments: This patient presents with back/neck pain that seems musculoskeletal in origin. Based on my history and examination, several diagnoses including cauda equina syndrome, infection, AAA, kidney stones, have been considered and are unlikely. No life-threatening etiologies of back pain are discernible at this time, and the patient's symptoms will be managed with symptomatic care. I advised the patient to return to the emergency department immediately should they develop worsening pain, fever, weakness, bowel or bladder symptoms, or any acute concerns .  The patient was deemed well enough for discharge.  Diagnostic  impression and plan were discussed with the patient and/or family.  If ordered, results of lab/radiology tests were discussed with the patient and/or family. Questions were answered and concerns were addressed.  The patient was encouraged to follow-up with their primary care provider or specialist if not improved.        Amount and/or Complexity of Data Reviewed  Tests in the radiology section of CPT: ordered and reviewed  ED Course as of 05/18/21 1758   Sat May 18, 2021   1757 At time of [place]< pt ambulated to desk with no distress and normal gait and stated wanted something stronger for his back pain such as an opiate, explained that studies show tylenol + ibuprofen are just as potent as opiates in low back pain and that opiates are not indicated at this time [MK]      ED Course User Index  [MK] Maryjean Morn, DO             Procedures    Clinical Impression & Disposition     Clinical Impression  Final diagnoses:   Fall, initial encounter   Acute myofascial strain of lumbosacral region, initial encounter   Lumbar contusion, initial encounter   Strain of right shoulder, initial encounter   Contusion of right shoulder, initial encounter        ED Disposition       ED Disposition   Discharge    Condition   --    Date/Time   Sat May 18, 2021  5:24 PM    Comment   Ezequiel Ganser Kijowski discharge to home/self care.    Condition at disposition: Stable                  Discharge Medication List as of 05/18/2021  5:24 PM        START taking these medications    Details   acetaminophen (TYLENOL) 325 MG tablet Take 2 tablets (650 mg total) by mouth every 6 (six) hours as needed for Pain, Starting Sat 05/18/2021, OTC      !! ibuprofen (ADVIL) 600 MG tablet Take 1 tablet (600 mg total) by mouth every 6 (six) hours as needed for Pain, Starting Sat 05/18/2021, E-Rx      lidocaine (LIDODERM) 5 % Place 1 patch onto the skin every 24 hours Remove & Discard patch within 12 hours or as directed by MD, Starting Sat 05/18/2021, E-Rx        !! - Potential duplicate medications found. Please discuss with provider.                      Maryjean Morn, DO  05/18/21 1758

## 2021-05-18 NOTE — ED Notes (Signed)
Called 3 more times.

## 2021-05-18 NOTE — ED Triage Notes (Signed)
Pt to ED for CC of falling down approx 16 steps after being pulled by dog, pt stating soreness all over body. Pt in stretcher, nadn, call bell within reach.

## 2021-05-26 ENCOUNTER — Emergency Department: Payer: PRIVATE HEALTH INSURANCE

## 2021-05-26 ENCOUNTER — Emergency Department
Admission: EM | Admit: 2021-05-26 | Discharge: 2021-05-27 | Disposition: A | Discharge status: Home / Self Care | Payer: PRIVATE HEALTH INSURANCE | Attending: Emergency Medicine | Admitting: Emergency Medicine

## 2021-05-26 DIAGNOSIS — R1031 Right lower quadrant pain: Secondary | ICD-10-CM | POA: Insufficient documentation

## 2021-05-26 DIAGNOSIS — M5126 Other intervertebral disc displacement, lumbar region: Secondary | ICD-10-CM | POA: Insufficient documentation

## 2021-05-26 DIAGNOSIS — F418 Other specified anxiety disorders: Secondary | ICD-10-CM | POA: Insufficient documentation

## 2021-05-26 DIAGNOSIS — M5136 Other intervertebral disc degeneration, lumbar region: Secondary | ICD-10-CM

## 2021-05-26 DIAGNOSIS — R1033 Periumbilical pain: Secondary | ICD-10-CM | POA: Insufficient documentation

## 2021-05-26 DIAGNOSIS — R1032 Left lower quadrant pain: Secondary | ICD-10-CM | POA: Insufficient documentation

## 2021-05-26 DIAGNOSIS — F32A Depression, unspecified: Secondary | ICD-10-CM

## 2021-05-26 DIAGNOSIS — F419 Anxiety disorder, unspecified: Secondary | ICD-10-CM

## 2021-05-26 LAB — VH URINE DRUG SCREEN - NO CONFIRMATION
Propoxyphene: NEGATIVE
Urine Amphetamine: NEGATIVE
Urine Barbiturates: NEGATIVE
Urine Benzodiazepines: POSITIVE — AB
Urine Buprenorphine: NEGATIVE
Urine Cannabinoids: POSITIVE — AB
Urine Cocaine: NEGATIVE
Urine Creatinine Random: 311 mg/dL
Urine Ecstasy Screen: NEGATIVE
Urine Fentanyl Screen: NEGATIVE
Urine Methadone Screen: POSITIVE — AB
Urine Opiates: NEGATIVE
Urine Oxycodone: NEGATIVE
Urine Phencyclidine: NEGATIVE
Urine Specific Gravity: 1.021 (ref 1.001–1.040)
pH, Urine: 5.7 pH (ref 5.0–8.0)

## 2021-05-26 LAB — CBC AND DIFFERENTIAL
Basophils %: 0.5 % (ref 0.0–3.0)
Basophils Absolute: 0.1 10*3/uL (ref 0.0–0.3)
Eosinophils %: 2.8 % (ref 0.0–7.0)
Eosinophils Absolute: 0.3 10*3/uL (ref 0.0–0.8)
Hematocrit: 36.8 % — ABNORMAL LOW (ref 39.0–52.5)
Hemoglobin: 11.6 gm/dL — ABNORMAL LOW (ref 13.0–17.5)
Lymphocytes Absolute: 2.4 10*3/uL (ref 0.6–5.1)
Lymphocytes: 21.8 % (ref 15.0–46.0)
MCH: 26 pg — ABNORMAL LOW (ref 28–35)
MCHC: 32 gm/dL (ref 32–36)
MCV: 83 fL (ref 80–100)
MPV: 7.2 fL (ref 6.0–10.0)
Monocytes Absolute: 1.2 10*3/uL (ref 0.1–1.7)
Monocytes: 10.7 % (ref 3.0–15.0)
Neutrophils %: 64.2 % (ref 42.0–78.0)
Neutrophils Absolute: 7.1 10*3/uL (ref 1.7–8.6)
PLT CT: 494 10*3/uL — ABNORMAL HIGH (ref 130–440)
RBC: 4.44 10*6/uL (ref 4.00–5.70)
RDW: 11.3 % (ref 11.0–14.0)
WBC: 11.1 10*3/uL — ABNORMAL HIGH (ref 4.0–11.0)

## 2021-05-26 LAB — VH URINALYSIS WITH MICROSCOPIC
Blood, UA: NEGATIVE mg/dL
Glucose, UA: NEGATIVE mg/dL
Leukocyte Esterase, UA: NEGATIVE Leu/uL
Nitrite, UA: NEGATIVE
RBC, UA: 1 /hpf (ref 0–4)
Urine Specific Gravity: 1.035 — ABNORMAL HIGH (ref 1.005–1.030)
Urobilinogen, UA: NORMAL mg/dL — AB
WBC, UA: 6 /hpf — ABNORMAL HIGH (ref 0–4)
pH, Urine: 6 pH (ref 5.0–8.0)

## 2021-05-26 LAB — I-STAT CHEM 8 CARTRIDGE
Anion Gap I-Stat: 14 (ref 7.0–16.0)
BUN I-Stat: 13 mg/dL (ref 7–22)
Calcium Ionized I-Stat: 4.7 mg/dL (ref 4.35–5.10)
Chloride I-Stat: 99 mMol/L (ref 98–110)
Creatinine I-Stat: 0.9 mg/dL (ref 0.80–1.30)
EGFR: 118 mL/min/{1.73_m2} (ref 60–150)
Glucose I-Stat: 72 mg/dL (ref 71–99)
Hematocrit I-Stat: 36 % — ABNORMAL LOW (ref 39.0–52.5)
Hemoglobin I-Stat: 12.2 gm/dL — ABNORMAL LOW (ref 13.0–17.5)
Potassium I-Stat: 3.8 mMol/L (ref 3.5–5.3)
Sodium I-Stat: 135 mMol/L — ABNORMAL LOW (ref 136–147)
TCO2 I-Stat: 27 mMol/L (ref 24–29)

## 2021-05-26 LAB — SALICYLATE LEVEL: Salicylate Level: <5 mg/dL (ref 0.0–30.0)

## 2021-05-26 LAB — ACETAMINOPHEN LEVEL: Acetaminophen Level: <0.6 ug/mL (ref 0.0–30.0)

## 2021-05-26 LAB — TSH: TSH: 1.69 u[IU]/mL (ref 0.40–4.20)

## 2021-05-26 LAB — VH I-STAT CHEM 8 NOTIFICATION

## 2021-05-26 LAB — ETHANOL: Alcohol: <10 mg/dL (ref 0–9)

## 2021-05-26 MED ORDER — IOHEXOL 350 MG/ML IV SOLN
100.0000 mL | Freq: Once | INTRAVENOUS | Status: AC | PRN
Start: 2021-05-26 — End: 2021-05-26
  Administered 2021-05-26: 23:00:00 100 mL via INTRAVENOUS

## 2021-05-26 NOTE — ED Triage Notes (Signed)
Pt came to ED w/ c/o pain from a fall two weeks ago. Two weeks ago pt was walking dogs when "they seen a fox and pulled the leash and I fell down a flight of stairs." Since then pt has been taking 800mg  ibuprofen and using icy hot on lower back. Pain is now radiating into the pelvic region. Since the initial fall, pt states "has fallen multiple times" and "weakness in legs". The pain is leading to depression worsening "I cant get out of bed." "My mental health is because of the pain." Denies any SI.

## 2021-05-26 NOTE — ED Notes (Addendum)
Pt noted to be exceedingly anxious during triage process & perseverative regarding pain experience and his reported inability to move and perform ADLs.     Pt made the following several comments:   "I have got to figure out what's going on. 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 I went through a mental breakdown in 2020 when COVID hit."    When asked how he would rate his pain & educated on the pain scale being 0-10, pt stated "If a 10 is that I am going to blow my brains out, then I am second to that."    Pt never endorsed any SI/HI. Appearing very anxious and tearful. Reports "I have a really good support system. I just don't know if I can do this."

## 2021-05-26 NOTE — ED Notes (Signed)
Crisis care contacted by Dr. Shea Evans

## 2021-05-26 NOTE — ED Provider Notes (Addendum)
[facility]  EMERGENCY DEPARTMENT  History and Physical Exam       Patient Name: Clayton Davis, Clayton Davis  Encounter Date:  05/26/2021  Attending Physician: [place] Douglas Rooks, MD  PCP: Hillery Jacks, PA  Patient DOB:  21-Sep-1991  MRN:  [id number]  Room:  S29/S29-A      History of Presenting Illness     Chief complaint: Back Pain    HPI/ROS is limited by: none  HPI/ROS given by: Patient and Spouse    Jeven Topper is a 30 y.o. 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 Pt reports during the COVID lock down he became depressed and began taking Buspirone, Prozac, Adderall, and Klonopin which help him a lot. Pt notes that 3 weeks ago his dog died, which caused pt to become very depressed. A few days afterwards, pt was drinking (which he rarely does) and fell down the stairs while walking his new dog. Pt's wife wanted to call EMS, but pt was embarrassed that he had been drinking, so he asked wife to not call EMS. Pt notes he was seen approx 1 week after this for imaging, and the doctor told him he was very bruised. Pt states his pain increased, and he was self-medicating with motrin and lidocaine patches. He also bought a cane and a back brace along with heating pads. Pt states the the pain is making him extremely depressed because he is unable to do anything except lay down in pain. He also notes the pain is so severe that he is unable to sleep and he feels "delirious." He also says that in the past day he has been having pain in his abd as well. Pt denies n/v/d, GI/GU symptoms, CP, SOB, HA, fever, or other pain or symptoms.       Review of Systems     Review of Systems   Constitutional:  Positive for fatigue (due to lack of sleep). Negative for appetite change, chills and fever.   HENT:  Negative for ear pain, sinus pain and sore throat.    Eyes:  Negative for pain and visual disturbance.    Respiratory:  Negative for cough and shortness of breath.    Cardiovascular:  Negative for chest pain.   Gastrointestinal:  Positive for abdominal pain. Negative for diarrhea, nausea and vomiting.   Endocrine: Negative for polyuria.   Genitourinary:  Negative for difficulty urinating, dysuria and frequency.   Musculoskeletal:  Positive for back pain and gait problem. Negative for arthralgias, myalgias and neck pain.   Skin:  Negative for rash.   Allergic/Immunologic: Negative.    Neurological:  Negative for weakness and headaches.   Hematological: Negative.    Psychiatric/Behavioral:  Positive for dysphoric mood. Negative for confusion. The patient is nervous/anxious.    All other systems reviewed and are negative.     Allergies & Medications     Pt is allergic to quetiapine.    Home Meds: EMR link not correct; nurses' notes reviewed for meds and dosages     Past Medical History     Pt has a past medical history of ADHD, Anxiety, Depression, No known health problems, and Sleep apnea.     Past Surgical History     Pt  has a past surgical history that includes No past surgeries.     Family  History     The family history includes Cancer in his maternal grandmother; Diabetes in his maternal grandfather; No known problems in his brother, father, maternal aunt, maternal uncle, mother, paternal aunt, paternal grandfather, paternal grandmother, paternal uncle, and sister.     Social History     Pt reports that he has been smoking cigarettes. He has never used smokeless tobacco. He reports current alcohol use. He reports that he does not currently use drugs.     Physical Exam     Blood pressure 107/78, pulse 82, temperature 97.8 F (36.6 C), temperature source Oral, resp. rate 19, weight 92.2 kg, SpO2 100 %.    Physical Exam  Vitals and nursing note reviewed. Exam conducted with a chaperone present.   Constitutional:       General: He is in acute distress.      Appearance: He is well-developed. He is not ill-appearing,  toxic-appearing or diaphoretic.   HENT:      Head: Normocephalic and atraumatic.      Right Ear: External ear normal.      Left Ear: External ear normal.      Nose: Nose normal.      Mouth/Throat:      Mouth: Mucous membranes are moist.   Eyes:      Extraocular Movements: Extraocular movements intact.   Cardiovascular:      Rate and Rhythm: Normal rate and regular rhythm.      Heart sounds: Normal heart sounds. No murmur heard.    No friction rub. No gallop.   Pulmonary:      Effort: Pulmonary effort is normal. No respiratory distress.      Breath sounds: Normal breath sounds. No wheezing or rales.   Abdominal:      General: There is no distension.      Palpations: Abdomen is soft.      Tenderness: There is abdominal tenderness in the right lower quadrant, periumbilical area, suprapubic area and left lower quadrant. There is no guarding.   Genitourinary:     Rectum: Guaiac result negative (stool brown).   Musculoskeletal:      Cervical back: Normal range of motion. No tenderness.      Thoracic back: Tenderness present.      Lumbar back: Tenderness present.   Skin:     General: Skin is warm and dry.      Capillary Refill: Capillary refill takes less than 2 seconds.      Findings: No bruising.   Neurological:      General: No focal deficit present.      Mental Status: He is alert and oriented to person, place, and time.      Motor: Motor function is intact.      Comments: Normal strength and sensation bilateral lower extremities   Psychiatric:         Attention and Perception: Attention normal.         Mood and Affect: Mood is anxious. Affect is tearful.         Speech: Speech is tangential.         Behavior: Behavior is cooperative.         Thought Content: Thought content does not include homicidal or suicidal ideation.         Judgment: Judgment normal.          Diagnostic Results     The results of the diagnostic studies below have been reviewed by myself:    Labs  Labs  Reviewed   CBC AND DIFFERENTIAL - Abnormal;  Notable for the following components:       Result Value    WBC 11.1 (*)     Hemoglobin 11.6 (*)     Hematocrit 36.8 (*)     MCH 26 (*)     PLT CT 494 (*)     All other components within normal limits   VH URINALYSIS WITH MICROSCOPIC - Abnormal; Notable for the following components:    Urine Specific Gravity 1.035 (*)     Protein, UR Trace (*)     Ketones UA Trace (*)     Bilirubin, UA Small (*)     Urobilinogen, UA Normal (*)     WBC, UA 6 (*)     All other components within normal limits   VH URINE DRUG SCREEN - Abnormal; Notable for the following components:    Urine Benzodiazepines Presumptive Positive (*)     Urine Cannabinoids Presumptive Positive (*)     Urine Methadone Screen Presumptive Positive (*)     All other components within normal limits   I-STAT CHEM 8 CARTRIDGE - Abnormal; Notable for the following components:    Sodium I-Stat 135 (*)     Hematocrit I-Stat 36.0 (*)     Hemoglobin I-Stat 12.2 (*)     All other components within normal limits   VH I-STAT CHEM 8 NOTIFICATION   VH EXTRA SPECIMEN LABEL   ETHANOL   SALICYLATE LEVEL   ACETAMINOPHEN LEVEL   TSH       Radiologic Studies  CT Abdomen Pelvis with IV Cont    Result Date: 05/26/2021  No acute abdominal pelvic process. Broad-based disc bulge at L4-L5 causing moderate left neural foraminal narrowing. Recommendations: None. ReadingStation:[id number]        ED Course & Treatment     Previous records reviewed.  D/dx, workup, anticipated clinical course discussed with patient/family.  Results reviewed with patient/family.  Patient appreciative of care.  All questions answered.  Patient/family comfortable with treatment plan.  Case reviewed with consultant; history, physical exam, ancillary studies, and plan of care discussed.       23:15 - Sheena from crisis care will come evaluate pt    0035  Pt has been evaluated by crisis care and they are comfortable with discharge home on a safety plan    0036  Results explained.  All questions answered.  Pt is  comfortable w/ disposition.    30 year old male presents complaining of severe lower back pain after recent fall.  He reports secondary to the pain in his back and physical limitations resulting from the injury, he has been depressed and anxious.  He denies any suicidal ideations.  He was seen in our emergency department previously and had x-rays done.  He is tender to his abdomen as well.  CT scan shows a broad-based bulging lumbar disc.  He was also evaluated by crisis care, who felt he can be discharged home with a safety plan.  He was discharged home with prescriptions for Medrol Dosepak and Norco.  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  He assured me that he would not abuse the hydrocodone.  I did recommend PCP follow-up for incidental anemia.  His stool is guaiac negative.  Also recommended follow-up with neurosurgery for the bulging disc.     Medical Decision Making     DDx: The differential diagnosis includes, but is not limited to acute  myofascial strain, low back pain (acute, chronic), fracture, contusion, spondylolisthesis, aortic abdominal aneurysm, acute sciatica, acute herniated disc, degenerative disc disease, urinary tract infection/pyelonephritis, kidney stone,malignancy, metastatic disease, acute cord compression, cauda equina syndrome, infection (epidural abscess, osteomyelitis)    This chart was generated by an EMR, and parts may have been dictated through an electronic transcription program, and so this chart may contain errors or omissions not intended by the user.     Procedures / Critical Care     None     Diagnosis / Disposition     Clinical Impression  1. Bulging lumbar disc    2. Depression, unspecified depression type    3. Anxiety        Disposition  ED Disposition       ED Disposition   Discharge    Condition   --    Date/Time   Mon May 27, 2021 12:35 AM    Comment   Roselyn Bering discharge to home/self care.    Condition at disposition: Stable                  Follow up for Discharged Patients  [place], Patrick David, MD  [address]  [place] [address]  [phone]    Call in 1 day(s)  To arrange outpatient follow-up    Hillery Jacks, [place]  [address]  [place] [address]  [phone]    Call in 1 day(s)  To arrange outpatient follow-up    [facility] Emergency Department  [address]  [place] [address]  [phone]  Follow up  As needed, If symptoms worsen    Prescriptions for Discharged Patients  Discharge Medication List as of 05/27/2021 12:35 AM        START taking these medications    Details   HYDROcodone-acetaminophen (NORCO) 5-325 MG per tablet Take 1-2 tablets by mouth every 6 (six) hours as needed for Pain, Starting Mon 05/27/2021, E-Rx      methylPREDNISolone (MEDROL DOSEPAK) 4 MG tablet follow package directions, E-Rx      naloxone (NARCAN) 4 MG/0.1ML nasal spray 1 spray intranasally. If pt does not respond or relapses into respiratory depression call 911. Give additional doses every 2-3 min., E-Rx             Maureen Ralphs was my scribe today and assisted me with documentation only. He was present during the questioning and physical examination of this patient and documented what he observed and was instructed to document. This note accurately reflects work and decisions made by me.               Wardell Pokorski, [place] B, MD  05/27/21 1027       Idell Hissong, [place] B, MD  05/27/21 [phone]

## 2021-05-27 ENCOUNTER — Telehealth: Payer: Self-pay

## 2021-05-27 MED ORDER — HYDROCODONE-ACETAMINOPHEN 5-325 MG PO TABS
2.0000 | ORAL_TABLET | Freq: Once | ORAL | Status: AC
Start: 2021-05-27 — End: 2021-05-27
  Administered 2021-05-27: 01:00:00 2 via ORAL

## 2021-05-27 MED ORDER — PREDNISONE 20 MG PO TABS
60.0000 mg | ORAL_TABLET | Freq: Once | ORAL | Status: AC
Start: 2021-05-27 — End: 2021-05-27
  Administered 2021-05-27: 01:00:00 60 mg via ORAL

## 2021-05-27 MED ORDER — HYDROCODONE-ACETAMINOPHEN 5-325 MG PO TABS
1.0000 | ORAL_TABLET | Freq: Four times a day (QID) | ORAL | 0 refills | Status: DC | PRN
Start: 2021-05-27 — End: 2021-07-09

## 2021-05-27 MED ORDER — PREDNISONE 20 MG PO TABS
ORAL_TABLET | ORAL | Status: AC
Start: 2021-05-27 — End: ?
  Filled 2021-05-27: qty 3

## 2021-05-27 MED ORDER — NALOXONE HCL 4 MG/0.1ML NA LIQD
NASAL | 0 refills | Status: DC
Start: 2021-05-27 — End: 2021-07-09

## 2021-05-27 MED ORDER — HYDROCODONE-ACETAMINOPHEN 5-325 MG PO TABS
2.0000 | ORAL_TABLET | Freq: Once | ORAL | Status: AC
Start: 2021-05-27 — End: 2021-05-27
  Administered 2021-05-27: 01:00:00 2

## 2021-05-27 MED ORDER — HYDROCODONE-ACETAMINOPHEN 5-325 MG PO TABS
ORAL_TABLET | ORAL | Status: AC
Start: 2021-05-27 — End: ?
  Filled 2021-05-27: qty 4

## 2021-05-27 MED ORDER — METHYLPREDNISOLONE 4 MG PO TBPK
ORAL_TABLET | ORAL | 0 refills | Status: DC
Start: 2021-05-27 — End: 2021-07-09
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
## 2021-05-27 NOTE — Discharge Instructions (Signed)
Take Medrol Dosepak as prescribed  Take Norco as prescribed as needed for pain  Follow-up with Dr. [place] soon as possible for further evaluation  Follow-up with your primary care physician within 2 to 3 days for further evaluation of your anemia  Return to the emergency department immediately as needed for intractable pain, leg weakness, bowel or bladder incontinence or new or worsening concerns

## 2021-05-27 NOTE — Telephone Encounter (Signed)
This RN attempted to reach patient for a follow up to ER visit on 05/26/21, but was unable to reach patient.  Left voicemail for patient to call back to the Behavioral Health Navigator phone number.

## 2021-05-27 NOTE — ED Notes (Signed)
Crisis care at bedside.

## 2021-05-28 ENCOUNTER — Telehealth: Payer: Self-pay

## 2021-05-28 NOTE — Telephone Encounter (Signed)
This RN attempted to reach patient for a follow up to ER visit on 05/26/21, but was unable to reach patient.  Left voicemail for patient to call back to the Clayton Davis phone number.

## 2021-05-30 ENCOUNTER — Emergency Department
Admission: EM | Admit: 2021-05-30 | Discharge: 2021-05-30 | Disposition: A | Discharge status: Home / Self Care | Payer: PRIVATE HEALTH INSURANCE | Attending: Emergency Medicine | Admitting: Emergency Medicine

## 2021-05-30 DIAGNOSIS — R079 Chest pain, unspecified: Secondary | ICD-10-CM

## 2021-05-30 DIAGNOSIS — Z5321 Procedure and treatment not carried out due to patient leaving prior to being seen by health care provider: Secondary | ICD-10-CM

## 2021-05-30 LAB — CBC AND DIFFERENTIAL
Basophils %: 0.1 % (ref 0.0–3.0)
Basophils Absolute: 0 10*3/uL (ref 0.0–0.3)
Eosinophils %: 0.4 % (ref 0.0–7.0)
Eosinophils Absolute: 0.1 10*3/uL (ref 0.0–0.8)
Hematocrit: 42 % (ref 39.0–52.5)
Hemoglobin: 13.3 gm/dL (ref 13.0–17.5)
Lymphocytes Absolute: 1.8 10*3/uL (ref 0.6–5.1)
Lymphocytes: 10.6 % — ABNORMAL LOW (ref 15.0–46.0)
MCH: 26 pg — ABNORMAL LOW (ref 28–35)
MCHC: 32 gm/dL (ref 32–36)
MCV: 81 fL (ref 80–100)
MPV: 7.4 fL (ref 6.0–10.0)
Monocytes Absolute: 1.4 10*3/uL (ref 0.1–1.7)
Monocytes: 8 % (ref 3.0–15.0)
Neutrophils %: 80.9 % — ABNORMAL HIGH (ref 42.0–78.0)
Neutrophils Absolute: 13.9 10*3/uL — ABNORMAL HIGH (ref 1.7–8.6)
PLT CT: 710 10*3/uL — ABNORMAL HIGH (ref 130–440)
RBC: 5.21 10*6/uL (ref 4.00–5.70)
RDW: 11.7 % (ref 11.0–14.0)
WBC: 17.1 10*3/uL — ABNORMAL HIGH (ref 4.0–11.0)

## 2021-05-30 LAB — BASIC METABOLIC PANEL
Anion Gap: 18.8 mMol/L — ABNORMAL HIGH (ref 7.0–18.0)
BUN / Creatinine Ratio: 17.5 Ratio (ref 10.0–30.0)
BUN: 14 mg/dL (ref 7–22)
CO2: 22 mMol/L (ref 20–30)
Calcium: 10.1 mg/dL (ref 8.5–10.5)
Chloride: 102 mMol/L (ref 98–110)
Creatinine: 0.8 mg/dL (ref 0.80–1.30)
EGFR: 122 mL/min/{1.73_m2} (ref 60–150)
Glucose: 106 mg/dL — ABNORMAL HIGH (ref 71–99)
Osmolality Calculated: 278 mOsm/kg (ref 275–300)
Potassium: 3.8 mMol/L (ref 3.5–5.3)
Sodium: 139 mMol/L (ref 136–147)

## 2021-05-30 NOTE — ED Notes (Signed)
Pt was seen leaving the ER in a car. Pt had a PIV in place that was placed by EMS.  This RN called the pt @ [phone] and asked him to come back to the ER so that the PIV could be removed. The pt stated that he would come back. He also stated that he is with his wife and no longer wants to be evaluated in the ER.

## 2021-06-02 LAB — ECG 12-LEAD
P Wave Axis: 84 deg
P-R Interval: 122 ms
Patient Age: 29 years
Q-T Interval(Corrected): 589 ms
Q-T Interval: 538 ms
QRS Axis: 66 deg
QRS Duration: 101 ms
T Axis: 49 years
Ventricular Rate: 72 //min

## 2021-06-04 ENCOUNTER — Telehealth: Payer: Self-pay

## 2021-06-04 NOTE — Telephone Encounter (Signed)
This RN attempted to reach patient for a 1-week follow up to ER visit on 05/26/21, but was unable to reach patient.  Left voicemail for patient to call back to the [facility] phone number.

## 2021-06-21 ENCOUNTER — Encounter: Payer: Self-pay | Admitting: Family

## 2021-06-21 DIAGNOSIS — M5412 Radiculopathy, cervical region: Secondary | ICD-10-CM

## 2021-06-21 DIAGNOSIS — M5416 Radiculopathy, lumbar region: Secondary | ICD-10-CM

## 2021-06-27 ENCOUNTER — Other Ambulatory Visit: Payer: Self-pay | Admitting: Physician Assistant

## 2021-06-27 ENCOUNTER — Encounter: Payer: Self-pay | Admitting: Physician Assistant

## 2021-06-27 DIAGNOSIS — M5416 Radiculopathy, lumbar region: Secondary | ICD-10-CM

## 2021-06-27 DIAGNOSIS — M5412 Radiculopathy, cervical region: Secondary | ICD-10-CM

## 2021-06-28 ENCOUNTER — Emergency Department: Payer: PRIVATE HEALTH INSURANCE

## 2021-06-28 ENCOUNTER — Inpatient Hospital Stay
Admission: EM | Admit: 2021-06-28 | Discharge: 2021-07-09 | DRG: 540 | Disposition: A | Discharge status: Home / Self Care | Payer: PRIVATE HEALTH INSURANCE | Attending: Internal Medicine | Admitting: Internal Medicine

## 2021-06-28 ENCOUNTER — Inpatient Hospital Stay: Payer: PRIVATE HEALTH INSURANCE

## 2021-06-28 DIAGNOSIS — Z6379 Other stressful life events affecting family and household: Secondary | ICD-10-CM

## 2021-06-28 DIAGNOSIS — W109XXD Fall (on) (from) unspecified stairs and steps, subsequent encounter: Secondary | ICD-10-CM | POA: Diagnosis present

## 2021-06-28 DIAGNOSIS — Z79899 Other long term (current) drug therapy: Secondary | ICD-10-CM

## 2021-06-28 DIAGNOSIS — F909 Attention-deficit hyperactivity disorder, unspecified type: Secondary | ICD-10-CM | POA: Diagnosis present

## 2021-06-28 DIAGNOSIS — M4646 Discitis, unspecified, lumbar region: Secondary | ICD-10-CM

## 2021-06-28 DIAGNOSIS — G4730 Sleep apnea, unspecified: Secondary | ICD-10-CM | POA: Diagnosis present

## 2021-06-28 DIAGNOSIS — F17210 Nicotine dependence, cigarettes, uncomplicated: Secondary | ICD-10-CM | POA: Diagnosis present

## 2021-06-28 DIAGNOSIS — E785 Hyperlipidemia, unspecified: Secondary | ICD-10-CM | POA: Diagnosis present

## 2021-06-28 DIAGNOSIS — F419 Anxiety disorder, unspecified: Secondary | ICD-10-CM | POA: Diagnosis present

## 2021-06-28 DIAGNOSIS — M4626 Osteomyelitis of vertebra, lumbar region: Principal | ICD-10-CM

## 2021-06-28 DIAGNOSIS — Z6832 Body mass index (BMI) 32.0-32.9, adult: Secondary | ICD-10-CM

## 2021-06-28 DIAGNOSIS — R791 Abnormal coagulation profile: Secondary | ICD-10-CM

## 2021-06-28 DIAGNOSIS — Z809 Family history of malignant neoplasm, unspecified: Secondary | ICD-10-CM

## 2021-06-28 DIAGNOSIS — D6489 Other specified anemias: Secondary | ICD-10-CM | POA: Diagnosis present

## 2021-06-28 DIAGNOSIS — Z205 Contact with and (suspected) exposure to viral hepatitis: Secondary | ICD-10-CM | POA: Diagnosis present

## 2021-06-28 DIAGNOSIS — R109 Unspecified abdominal pain: Secondary | ICD-10-CM

## 2021-06-28 DIAGNOSIS — B965 Pseudomonas (aeruginosa) (mallei) (pseudomallei) as the cause of diseases classified elsewhere: Secondary | ICD-10-CM | POA: Diagnosis present

## 2021-06-28 DIAGNOSIS — K5903 Drug induced constipation: Secondary | ICD-10-CM | POA: Diagnosis present

## 2021-06-28 DIAGNOSIS — Z28310 Unvaccinated for covid-19: Secondary | ICD-10-CM

## 2021-06-28 DIAGNOSIS — F1120 Opioid dependence, uncomplicated: Secondary | ICD-10-CM | POA: Diagnosis present

## 2021-06-28 DIAGNOSIS — F17290 Nicotine dependence, other tobacco product, uncomplicated: Secondary | ICD-10-CM | POA: Diagnosis present

## 2021-06-28 DIAGNOSIS — Z888 Allergy status to other drugs, medicaments and biological substances status: Secondary | ICD-10-CM

## 2021-06-28 DIAGNOSIS — S32039D Unspecified fracture of third lumbar vertebra, subsequent encounter for fracture with routine healing: Secondary | ICD-10-CM

## 2021-06-28 DIAGNOSIS — F1990 Other psychoactive substance use, unspecified, uncomplicated: Secondary | ICD-10-CM

## 2021-06-28 DIAGNOSIS — M869 Osteomyelitis, unspecified: Secondary | ICD-10-CM | POA: Diagnosis present

## 2021-06-28 DIAGNOSIS — R9431 Abnormal electrocardiogram [ECG] [EKG]: Secondary | ICD-10-CM | POA: Diagnosis present

## 2021-06-28 DIAGNOSIS — E669 Obesity, unspecified: Secondary | ICD-10-CM

## 2021-06-28 DIAGNOSIS — S330XXD Traumatic rupture of lumbar intervertebral disc, subsequent encounter: Secondary | ICD-10-CM

## 2021-06-28 DIAGNOSIS — Z86711 Personal history of pulmonary embolism: Secondary | ICD-10-CM

## 2021-06-28 DIAGNOSIS — Z8616 Personal history of COVID-19: Secondary | ICD-10-CM

## 2021-06-28 DIAGNOSIS — Z833 Family history of diabetes mellitus: Secondary | ICD-10-CM

## 2021-06-28 DIAGNOSIS — F1910 Other psychoactive substance abuse, uncomplicated: Secondary | ICD-10-CM

## 2021-06-28 DIAGNOSIS — F32A Depression, unspecified: Secondary | ICD-10-CM | POA: Diagnosis present

## 2021-06-28 LAB — COMPREHENSIVE METABOLIC PANEL
ALT: 16 U/L (ref 0–55)
AST (SGOT): 18 U/L (ref 10–42)
Albumin/Globulin Ratio: 0.61 Ratio — ABNORMAL LOW (ref 0.80–2.00)
Albumin: 3.3 gm/dL — ABNORMAL LOW (ref 3.5–5.0)
Alkaline Phosphatase: 43 U/L (ref 40–145)
Anion Gap: 14.4 mMol/L (ref 7.0–18.0)
BUN / Creatinine Ratio: 25.3 Ratio (ref 10.0–30.0)
BUN: 23 mg/dL — ABNORMAL HIGH (ref 7–22)
Bilirubin, Total: 0.3 mg/dL (ref 0.1–1.2)
CO2: 31 mMol/L — ABNORMAL HIGH (ref 20–30)
Calcium: 10.2 mg/dL (ref 8.5–10.5)
Chloride: 99 mMol/L (ref 98–110)
Creatinine: 0.91 mg/dL (ref 0.80–1.30)
EGFR: 116 mL/min/{1.73_m2} (ref 60–150)
Globulin: 5.4 gm/dL — ABNORMAL HIGH (ref 2.0–4.0)
Glucose: 90 mg/dL (ref 71–99)
Osmolality Calculated: 283 mOsm/kg (ref 275–300)
Potassium: 4.4 mMol/L (ref 3.5–5.3)
Protein, Total: 8.7 gm/dL — ABNORMAL HIGH (ref 6.0–8.3)
Sodium: 140 mMol/L (ref 136–147)

## 2021-06-28 LAB — CBC AND DIFFERENTIAL
Basophils %: 0.3 % (ref 0.0–3.0)
Basophils Absolute: 0 10*3/uL (ref 0.0–0.3)
Eosinophils %: 2.1 % (ref 0.0–7.0)
Eosinophils Absolute: 0.3 10*3/uL (ref 0.0–0.8)
Hematocrit: 39.2 % (ref 39.0–52.5)
Hemoglobin: 11.4 gm/dL — ABNORMAL LOW (ref 13.0–17.5)
Lymphocytes Absolute: 2.3 10*3/uL (ref 0.6–5.1)
Lymphocytes: 16.2 % (ref 15.0–46.0)
MCH: 24 pg — ABNORMAL LOW (ref 28–35)
MCHC: 29 gm/dL — ABNORMAL LOW (ref 32–36)
MCV: 82 fL (ref 80–100)
MPV: 7.2 fL (ref 6.0–10.0)
Monocytes Absolute: 1.3 10*3/uL (ref 0.1–1.7)
Monocytes: 9 % (ref 3.0–15.0)
Neutrophils %: 72.4 % (ref 42.0–78.0)
Neutrophils Absolute: 10.2 10*3/uL — ABNORMAL HIGH (ref 1.7–8.6)
PLT CT: 543 10*3/uL — ABNORMAL HIGH (ref 130–440)
RBC: 4.8 10*6/uL (ref 4.00–5.70)
RDW: 12.3 % (ref 11.0–14.0)
WBC: 14.1 10*3/uL — ABNORMAL HIGH (ref 4.0–11.0)

## 2021-06-28 LAB — VH URINE DRUG SCREEN - NO CONFIRMATION
Propoxyphene: NEGATIVE
Urine Amphetamine: NEGATIVE
Urine Barbiturates: NEGATIVE
Urine Benzodiazepines: POSITIVE — AB
Urine Buprenorphine: NEGATIVE
Urine Cannabinoids: NEGATIVE
Urine Cocaine: NEGATIVE
Urine Creatinine Random: 107 mg/dL
Urine Ecstasy Screen: NEGATIVE
Urine Fentanyl Screen: NEGATIVE
Urine Methadone Screen: POSITIVE — AB
Urine Opiates: POSITIVE — AB
Urine Oxycodone: NEGATIVE
Urine Phencyclidine: NEGATIVE
Urine Specific Gravity: 1.017 (ref 1.001–1.040)
pH, Urine: 5.6 pH (ref 5.0–8.0)

## 2021-06-28 LAB — MAGNESIUM: Magnesium: 1.8 mg/dL (ref 1.6–2.6)

## 2021-06-28 LAB — C-REACTIVE PROTEIN: C-Reactive Protein: 7.59 mg/dL — ABNORMAL HIGH (ref 0.02–0.80)

## 2021-06-28 LAB — LIPASE: Lipase: 43 U/L (ref 8–78)

## 2021-06-28 LAB — PT/INR
PT INR: 1.2 — ABNORMAL HIGH (ref 0.9–1.1)
PT: 12.5 s — ABNORMAL HIGH (ref 9.4–11.5)

## 2021-06-28 LAB — ETHANOL: Alcohol: <10 mg/dL (ref 0–9)

## 2021-06-28 LAB — SEDIMENTATION RATE: Sed Rate: 44 mm/hr — ABNORMAL HIGH (ref 0–15)

## 2021-06-28 MED ORDER — ONDANSETRON HCL 4 MG/2ML IJ SOLN
INTRAMUSCULAR | Status: AC
Start: 2021-06-28 — End: ?
  Filled 2021-06-28: qty 2

## 2021-06-28 MED ORDER — FENTANYL CITRATE (PF) 50 MCG/ML IJ SOLN (WRAP)
INTRAMUSCULAR | Status: AC
Start: 2021-06-28 — End: ?
  Filled 2021-06-28: qty 2

## 2021-06-28 MED ORDER — SODIUM CHLORIDE (PF) 0.9 % IJ SOLN
0.4000 mg | INTRAMUSCULAR | Status: DC | PRN
Start: 2021-06-28 — End: 2021-07-09

## 2021-06-28 MED ORDER — GADOTERATE MEGLUMINE 10 MMOL/20ML IV SOLN
19.0000 mL | Freq: Once | INTRAVENOUS | Status: AC | PRN
Start: 2021-06-28 — End: 2021-06-28
  Administered 2021-06-28: 06:00:00 9.5 mmol via INTRAVENOUS

## 2021-06-28 MED ORDER — OXYCODONE-ACETAMINOPHEN 5-325 MG PO TABS
1.0000 | ORAL_TABLET | ORAL | Status: DC | PRN
Start: 2021-06-28 — End: 2021-06-28

## 2021-06-28 MED ORDER — MORPHINE SULFATE 4 MG/ML IJ/IV SOLN (WRAP)
1.0000 mg | Status: DC | PRN
Start: 2021-06-28 — End: 2021-07-02

## 2021-06-28 MED ORDER — MIDAZOLAM HCL 1 MG/ML IJ SOLN (WRAP)
INTRAMUSCULAR | Status: AC
Start: 2021-06-28 — End: ?
  Filled 2021-06-28: qty 2

## 2021-06-28 MED ORDER — FLUOXETINE HCL 20 MG PO CAPS
20.0000 mg | ORAL_CAPSULE | Freq: Every morning | ORAL | Status: DC
Start: 2021-06-28 — End: 2021-06-29
  Administered 2021-06-28: 09:00:00 20 mg via ORAL
  Filled 2021-06-28: qty 1

## 2021-06-28 MED ORDER — SODIUM CHLORIDE 0.9 % IV MBP
2.0000 g | Freq: Two times a day (BID) | INTRAVENOUS | Status: DC
Start: 2021-06-28 — End: 2021-06-28
  Administered 2021-06-28: 07:00:00 2 g via INTRAVENOUS
  Filled 2021-06-28: qty 2

## 2021-06-28 MED ORDER — ACETAMINOPHEN 160 MG/5ML PO SOLN
650.0000 mg | ORAL | Status: DC | PRN
Start: 2021-06-28 — End: 2021-07-09

## 2021-06-28 MED ORDER — VANCOMYCIN HCL 1.5 G IV SOLR
1500.0000 mg | Freq: Once | INTRAVENOUS | Status: AC
Start: 2021-06-28 — End: 2021-06-28
  Administered 2021-06-28: 10:00:00 1500 mg via INTRAVENOUS
  Filled 2021-06-28 (×2): qty 1500

## 2021-06-28 MED ORDER — MORPHINE SULFATE 4 MG/ML IJ/IV SOLN (WRAP)
4.0000 mg | Freq: Once | Status: AC
Start: 2021-06-28 — End: 2021-06-28
  Administered 2021-06-28: 04:00:00 4 mg via INTRAVENOUS

## 2021-06-28 MED ORDER — VANCOMYCIN HCL IN DEXTROSE 1-5 GM/200ML-% IV SOLN
1000.0000 mg | Freq: Two times a day (BID) | INTRAVENOUS | Status: DC
Start: 2021-06-28 — End: 2021-06-28

## 2021-06-28 MED ORDER — KETOROLAC TROMETHAMINE 15 MG/ML IJ SOLN
INTRAMUSCULAR | Status: AC
Start: 2021-06-28 — End: ?
  Filled 2021-06-28: qty 1

## 2021-06-28 MED ORDER — OXYCODONE HCL 5 MG PO TABS
10.0000 mg | ORAL_TABLET | ORAL | Status: DC | PRN
Start: 2021-06-28 — End: 2021-07-09
  Administered 2021-06-29 – 2021-07-08 (×39): 10 mg via ORAL
  Filled 2021-06-28 (×39): qty 2

## 2021-06-28 MED ORDER — BUPROPION HCL ER (SR) 150 MG PO TB12
150.0000 mg | ORAL_TABLET | Freq: Every day | ORAL | Status: DC
Start: 2021-06-28 — End: 2021-07-09
  Administered 2021-06-28 – 2021-07-09 (×12): 150 mg via ORAL

## 2021-06-28 MED ORDER — VANCOMYCIN HCL 1.5 G IV SOLR
1500.0000 mg | Freq: Two times a day (BID) | INTRAVENOUS | Status: DC
Start: 2021-06-28 — End: 2021-06-28
  Filled 2021-06-28: qty 1500

## 2021-06-28 MED ORDER — BUSPIRONE HCL 5 MG PO TABS
15.0000 mg | ORAL_TABLET | Freq: Two times a day (BID) | ORAL | Status: DC
Start: 2021-06-28 — End: 2021-07-09
  Administered 2021-06-28 – 2021-07-09 (×23): 15 mg via ORAL
  Filled 2021-06-28 (×23): qty 1

## 2021-06-28 MED ORDER — METHADONE HCL 5 MG PO TABS
40.0000 mg | ORAL_TABLET | Freq: Three times a day (TID) | ORAL | Status: DC
Start: 2021-06-28 — End: 2021-07-01
  Administered 2021-06-28 – 2021-07-01 (×9): 40 mg via ORAL
  Filled 2021-06-28 (×9): qty 8

## 2021-06-28 MED ORDER — VH HEPARIN SODIUM (PORCINE) 5000 UNIT/ML IJ SOLN
5000.0000 [IU] | Freq: Three times a day (TID) | INTRAMUSCULAR | Status: DC
Start: 2021-06-28 — End: 2021-06-28
  Administered 2021-06-28: 07:00:00 5000 [IU] via SUBCUTANEOUS
  Filled 2021-06-28: qty 1

## 2021-06-28 MED ORDER — KETOROLAC TROMETHAMINE 15 MG/ML IJ SOLN
15.0000 mg | Freq: Once | INTRAMUSCULAR | Status: AC
Start: 2021-06-28 — End: 2021-06-28
  Administered 2021-06-28: 04:00:00 15 mg via INTRAVENOUS

## 2021-06-28 MED ORDER — SODIUM CHLORIDE (PF) 0.9 % IJ SOLN
3.0000 mL | Freq: Two times a day (BID) | INTRAMUSCULAR | Status: DC
Start: 2021-06-28 — End: 2021-07-04
  Administered 2021-06-28 – 2021-07-04 (×12): 3 mL via INTRAVENOUS

## 2021-06-28 MED ORDER — ACETAMINOPHEN 325 MG PO TABS
650.0000 mg | ORAL_TABLET | Freq: Four times a day (QID) | ORAL | Status: DC
Start: 2021-06-28 — End: 2021-07-03
  Administered 2021-06-28 – 2021-07-03 (×19): 650 mg via ORAL
  Filled 2021-06-28 (×19): qty 2

## 2021-06-28 MED ORDER — VH VANCOMYCIN THERAPY PLACEHOLDER
Status: DC
Start: 2021-06-28 — End: 2021-06-28

## 2021-06-28 MED ORDER — ATORVASTATIN CALCIUM 40 MG PO TABS
40.0000 mg | ORAL_TABLET | Freq: Every day | ORAL | Status: DC
Start: 2021-06-28 — End: 2021-07-09
  Administered 2021-06-28 – 2021-07-09 (×12): 40 mg via ORAL
  Filled 2021-06-28 (×12): qty 1

## 2021-06-28 MED ORDER — ACETAMINOPHEN 325 MG PO TABS
650.0000 mg | ORAL_TABLET | ORAL | Status: DC | PRN
Start: 2021-06-28 — End: 2021-07-09
  Administered 2021-07-08: 650 mg via ORAL
  Filled 2021-06-28: qty 2

## 2021-06-28 MED ORDER — POLYETHYLENE GLYCOL 3350 17 G PO PACK
17.0000 g | PACK | Freq: Every day | ORAL | Status: DC | PRN
Start: 2021-06-28 — End: 2021-07-09

## 2021-06-28 MED ORDER — DIAZEPAM 5 MG/ML IJ SOLN
5.0000 mg | Freq: Once | INTRAMUSCULAR | Status: AC
Start: 2021-06-28 — End: 2021-06-28
  Administered 2021-06-28: 05:00:00 5 mg via INTRAVENOUS

## 2021-06-28 MED ORDER — CYCLOBENZAPRINE HCL 10 MG PO TABS
5.0000 mg | ORAL_TABLET | Freq: Three times a day (TID) | ORAL | Status: DC | PRN
Start: 2021-06-28 — End: 2021-07-01
  Administered 2021-06-30 – 2021-07-01 (×3): 5 mg via ORAL
  Filled 2021-06-28 (×4): qty 1

## 2021-06-28 MED ORDER — MORPHINE SULFATE 4 MG/ML IJ/IV SOLN (WRAP)
Status: AC
Start: 2021-06-28 — End: ?
  Filled 2021-06-28: qty 1

## 2021-06-28 MED ORDER — IOHEXOL 350 MG/ML IV SOLN
100.0000 mL | Freq: Once | INTRAVENOUS | Status: AC | PRN
Start: 2021-06-28 — End: 2021-06-28
  Administered 2021-06-28: 04:00:00 100 mL via INTRAVENOUS

## 2021-06-28 MED ORDER — LORAZEPAM 0.5 MG PO TABS
0.5000 mg | ORAL_TABLET | ORAL | Status: DC | PRN
Start: 2021-06-28 — End: 2021-07-09

## 2021-06-28 MED ORDER — ACETAMINOPHEN 650 MG RE SUPP
650.0000 mg | RECTAL | Status: DC | PRN
Start: 2021-06-28 — End: 2021-07-09

## 2021-06-28 MED ORDER — DIAZEPAM 5 MG/ML IJ SOLN
INTRAMUSCULAR | Status: AC
Start: 2021-06-28 — End: ?
  Filled 2021-06-28: qty 2

## 2021-06-28 MED ORDER — DOCUSATE SODIUM 100 MG PO CAPS
200.0000 mg | ORAL_CAPSULE | Freq: Two times a day (BID) | ORAL | Status: DC
Start: 2021-06-28 — End: 2021-07-09
  Administered 2021-06-28 – 2021-07-09 (×16): 200 mg via ORAL
  Filled 2021-06-28 (×22): qty 2

## 2021-06-28 MED ORDER — IBUPROFEN 200 MG PO TABS
600.0000 mg | ORAL_TABLET | Freq: Four times a day (QID) | ORAL | Status: DC
Start: 2021-06-28 — End: 2021-06-30
  Administered 2021-06-28 – 2021-06-30 (×7): 600 mg via ORAL
  Filled 2021-06-28 (×7): qty 3

## 2021-06-28 MED ORDER — LIDOCAINE HCL (PF) 2 % IJ SOLN
INTRAMUSCULAR | Status: AC
Start: 2021-06-28 — End: ?
  Filled 2021-06-28: qty 10

## 2021-06-28 MED ORDER — KETOROLAC TROMETHAMINE 30 MG/ML IJ SOLN
30.0000 mg | Freq: Once | INTRAMUSCULAR | Status: AC
Start: 2021-06-28 — End: 2021-06-28
  Administered 2021-06-28: 15:00:00 30 mg via INTRAVENOUS
  Filled 2021-06-28: qty 1

## 2021-06-28 MED ORDER — VH BIO-K PLUS PROBIOTIC 50 BIL CFU CAPSULE
50.0000 | DELAYED_RELEASE_CAPSULE | Freq: Every day | ORAL | Status: DC
Start: 2021-06-28 — End: 2021-07-09
  Administered 2021-06-28 – 2021-07-09 (×11): 50 via ORAL
  Filled 2021-06-28 (×12): qty 1

## 2021-06-28 MED ORDER — ONDANSETRON HCL 4 MG/2ML IJ SOLN
4.0000 mg | Freq: Once | INTRAMUSCULAR | Status: AC
Start: 2021-06-28 — End: 2021-06-28
  Administered 2021-06-28: 04:00:00 4 mg via INTRAVENOUS

## 2021-06-28 NOTE — Teleconsult (Addendum)
Infectious Disease Live Tele-ID Consult [facility]  [employer]   Patient Name: Roselyn Bering   Attending Physician: Clabe Seal, MD   Primary Care Physician: Marisa Sprinkles, MD     Consultation Information  "This consult was provided via telemedicine using two-way real-time interactive telecommunication technology between the patient and the provider. The Adult nurse includes audio and video. Patient has been seen through the Telemedicine service with the assistance of a local tele-provider."    Consultant Contact Information: Please call [employer] [facility] [phone]    Impression and Recommendation  #L4-L5 discitis/osteomyelitis  -no abscess on imaging  -in setting of IVDU over past month  -*pt has requested that IVDU not be disclosed/discussed with spouse, family members  -pt denies any abx over past month  -had R hand swelling that resolved without intervention in past- but no current abscess/cellulitis noted  -Patient currently on vancomycin and cefepime.  -9/30 blood cultures are pending.  -Patient with no prior positive cultures in this EMR.  -Patient is scheduled for CT-guided aspiration of disc today.    Rec: Would hold further abx until CT guided aspiration performed as planned today to increase yield of cultures.  Empiric cefepime 2 g IV q8 and vancomycin IV with pharmacy followign for dosing/levels should be resumed after aspiration performed.  F/u Bcxs  Further recs to depend on these results     *I noted around 4 PM this afternoon that his aspiration was cancelled because pt ate food and abx were resumed.  Procedure will not be done until Monday per Dr. Lanny Hurst. However,  I would still recommend holding abx until aspiration or positive Bcxs identified as pt appears very stable.   If Bcxs become positive, then abx to target bacteremia can be started.  Will be very difficult situation if blood and aspirate cultures do not grow an organism.  Pt would  not be candidate for home IV and is high risk for leaving AMA given IVDU, so it is critical to identify organism and target treatment.  Relayed these recs to Dr. Lanny Hurst        Please page with any questions.  Chinita Greenland, M.D.  Tele-ID [id number]  Pager [phone]  *please note that Dr. Maeola Sarah to take over care of this patient on Monday-  pager [phone]           History of Presenting Illness  Clayton Davis is a 30 y.o. male.  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  Patient reports that approximately 1 month ago, he relapsed and started using IV drugs again.  He had presented to the ED several times in late August complaining of back pain that he initially reported started after a fall.  CT abdomen pelvis with IV contrast showed disc bulge at L4-L5.  Patient had been discharged home for outpatient follow-up.  Patient now presents complaining of worsening pain associated with difficulty walking.  He denied any saddle numbness or incontinence of bowel or urine.  Patient reported that he is currently not sharing all information with spouse.    Temperature was 97.7, heart rate 67, blood pressure 116/70, respiratory rate 12, O2 saturation 96%.  Admission labs significant for WBC 14.1, 72.4% neutrophils, hemoglobin 11.4, platelets 543, creatinine 0.91 CRP 7.59, ESR 44 UDS positive for methadone, opiates, and benzodiazepines.  CT abdomen pelvis with IV contrast showed osteomyelitis/discitis at L4-5.  MRI lumbar spine with contrast showed osteomyelitis-discitis at L4-5 and no abscess.    Patient currently on vancomycin and cefepime.    Patient has been afebrile.  9/30 blood cultures are pending.    Patient with no prior positive cultures in this EMR.    Patient is scheduled for CT-guided aspiration of disc today.  Pt reports that he injected drugs  sometime over past month after his dog died. He states he was clean for 6 years prior.  When asked about any noted skin issues, he reports his R hand became swollen at one point but then resolved without any intervention. He denies any current areas of skin that appear infected.  He denies taking antibiotics over the past month.  Had fever at home of 102.   Feels warm/cold today.       Histories    Past Medical History:  Past Medical History:   Diagnosis Date    ADHD     Anxiety     Depression     No known health problems     Sleep apnea        Past Social History:  Past Surgical History:   Procedure Laterality Date    NO PAST SURGERIES         Family History:  Family History   Problem Relation Age of Onset    No known problems Mother     No known problems Father     No known problems Sister     No known problems Brother     No known problems Maternal Aunt     No known problems Maternal Uncle     No known problems Paternal Aunt     No known problems Paternal Uncle     Cancer Maternal Grandmother     Diabetes Maternal Grandfather     No known problems Paternal Grandmother     No known problems Paternal Grandfather        Social History:  Social History     Tobacco Use    Smoking status: Some Days     Years: 10.00     Types: Cigarettes     Last attempt to quit: 10/12/2016     Years since quitting: 4.7    Smokeless tobacco: Never    Tobacco comments:     social   Vaping Use    Vaping Use: Every day   Substance Use Topics    Alcohol use: Yes     Comment: Socially x once a month    Drug use: Not Currently       Inpatient Medications    Antibiotic Medications:  Antibiotics (From admission, onward)       Start        06/28/21 1800  vancomycin (VANCOCIN) 1,500 mg in sodium chloride 0.9 % 250 mL IVPB (vial-mate)  Every 12 hours        End Date/Time: Until Discontinued    Comments:            06/28/21 0500  cefepime (MAXIPIME) 2 g in sodium chloride 0.9 % 100 mL IVPB mini-bag plus  Every 12 hours        End Date/Time: Until  Discontinued    Comments:            06/28/21 0455  vancomycin (VANCOCIN) 1,500 mg in sodium chloride 0.9 % 250 mL IVPB (vial-mate)  Once  End Date/Time: Until Discontinued    Comments:            06/28/21 0446  vancomycin therapy placeholder  See admin instructions        End Date/Time: Until Discontinued    Comments:                            Allergies:   Allergies   Allergen Reactions    Quetiapine Hives       Objective   Vitals: T:97.3 F (36.3 C) (Temporal),  BP:112/59, HR:61, RR:18, SaO2:97%,FiO2-O2 (L/m): , Dosing Wt:  Wt Readings from Last 1 Encounters:   06/28/21 86.2 kg (190 lb)   , AVW:UJWJ mass index is 26.5 kg/m.    PE:  Gen: awake, alert, NAD  HEENT: anicteric, mild conjunctival injection,mmm, ok dentition, neck supple  CV: RR, +S1, S2, no appreciated murmurs  Lungs:  CTA b/l,  no w/r/r  Abd: soft, slightly tender lower and R abdomen- no erythema/induration. ND  Extr: no c/c/e  Skin: RUE peripheral IV.  Numerous tattoos. No cellulitis/abscess noted on exposed skin             Results Review    Labs:  Estimated Creatinine Clearance: 126.4 mL/min (based on SCr of 0.91 mg/dL).  Recent Labs   Lab 06/28/21  0310   WBC 14.1*   RBC 4.80   Hemoglobin 11.4*   Hematocrit 39.2   MCV 82   PLT CT 543*     Recent Labs   Lab 06/28/21  0804   PT 12.5*   PT INR 1.2*         Lab Results   Component Value Date    HGBA1CPERCNT 5.3 01/23/2021     Recent Labs   Lab 06/28/21  0310   Glucose 90   Sodium 140   Potassium 4.4   Chloride 99   CO2 31*   BUN 23*   Creatinine 0.91   EGFR 116   Calcium 10.2     Recent Labs   Lab 06/28/21  0310   Albumin 3.3*   Protein, Total 8.7*   Bilirubin, Total 0.3   Alkaline Phosphatase 43   ALT 16   AST (SGOT) 18     Recent Labs   Lab 06/28/21  0455   Urine Specific Gravity 1.017   pH, Urine 5.6       MRI Lumbar Spine W WO Contrast    Result Date: 06/28/2021  IMPRESSION: Osteomyelitis-discitis at L4-5 is confirmed. No abscess  ReadingStation:[facility]    CT Abdomen Pelvis with IV  Cont    Result Date: 06/28/2021  No mass lesion identified. Osteomyelitis/discitis at L4-5. Associated circumferential disc bulge is worse. Subtle stranding in the subcutaneous fat over right buttock is new may suggest contusion  ReadingStation:[facility]     Chinita Greenland, MD  06/28/21 11:38 AM  MRN: [id number]                                     CSN: [id number]

## 2021-06-28 NOTE — Sedation Documentation (Signed)
Pt ate at 11 am today, procedure will be rescheduled for Monday

## 2021-06-28 NOTE — Progress Notes (Signed)
Pharmacy Vancomycin Dosing Consult Note  Ezequiel Ganser [place]    Assessment:   Indication: osteomyelitis/discitis  Day #1 Vancomycin + Cefepime in 29yoM.  Admitted with back/abdominal pain.  CT shows Osteomyelitis/discitis at L4-5. Associated circumferential disc bulge is worse. Prior scan was 05/26/21 in ER - no OM seen at that time.  WBC=14.4  Tmax=99.2F  blood cx pending     Plan:   Vancomycin 1500 mg IV q12H  Vancomycin Pharmacokinetic target: AUC24 (range) 400-600 mg/L/hr  Serum creatinine daily  Levels: Peak: TBD    Trough level TBD  AUC RandomTBD  MRSA nares n/a  Pharmacy will follow the patient's renal function, vancomycin levels, and dosing during the course of therapy. If you have any questions, please contact the pharmacist at [phone].      Age: 30 y.o.  Height: 1.803 m (5\' 11" )  Weight:  86.2 kg (190 lb)  IBW: 75.3 kg       Baseline Population Estimated Kinetics: Individual Kinetics (Once Levels Resulted):   SCr: 0.91 mg/dL    CrCl: >604 ml/min    Vancomycin Clearance: 5.19 L/hr    Volume: 71.9 L  For patients with BMI >40, [facility] Rx will assume a standard Volume of     t50 (half-life): 11.4 hours SCr:  mg/dL    CrCl:  ml/min    Vancomycin Clearance:  L/hr    Volume:  L  For patients with BMI >40, [facility] Rx will assume a standard Volume of 25 L    t50 (half-life):  hours     Expected AUC24 - steady state, Trough - steady state, and Risk of nephrotoxicity  Goal: AUC24,ss: 400 - 600 mg/L/hr  Goal: Probability of AUC24 > 400: Greater than 70%  Goal: Probability of nephrotoxicity: Less than 20%     Regimen: 1500 mg IV every 12 hours.  Start time: 05:29 on 06/28/2021  Exposure target: AUC24 (range)400-600 mg/L.hr   AUC24,ss: 569 mg/L.hr  Probability of AUC24 > 400: 83 %  Ctrough,ss: 17.3 mg/L  Probability of Ctrough,ss > 20: 39 %  Probability of nephrotoxicity (Lodise CID 2009): 13 %       Historic Patient Regimen   Date Regimen Weight SCr CrCl Trough Level   N/a N/a           Recent Labs   Lab 06/28/21  0310    Creatinine 0.91   BUN 23*   WBC 14.1*     Temp (24hrs), Avg:98.4 F (36.9 C), Min:97.7 F (36.5 C), Max:99.1 F (37.3 C)    Patient Vitals for the past 12 hrs:   BP Temp Pulse Resp   06/28/21 0512 -- 99.1 F (37.3 C) -- --   06/28/21 0502 110/71 -- 66 19   06/28/21 0401 116/70 -- 67 12   06/28/21 0221 101/58 97.7 F (36.5 C) 79 20        Microbiology Results (last 15 days)       Procedure Component Value Units Date/Time    Blood Culture - Venipuncture [[id number]] Collected: 06/28/21 0450    Order Status: Completed Specimen: Blood from Venipuncture Updated: 06/28/21 0509    Blood Culture - Venipuncture [[id number]]     Order Status: Sent Specimen: Blood from Venipuncture

## 2021-06-28 NOTE — ED Notes (Signed)
[facility] EMERGENCY DEPARTMENT  ED NURSING NOTE FOR THE RECEIVING INPATIENT NURSE   ED NURSE PHONE: [phone]    ADMISSION INFORMATION   Clayton Davis is a 30 y.o. male admitted with an ED diagnosis of:  1. Lumbar discitis      ED Pre-Departure Assessment:  Wife at bedside. Received 4mg  IV Morphine and Zofran as well as 15mg  Toradol in ED.    NURSING CARE   Isolation None   Home O2 No   Patient Comes From: Home Independent   Mental Status: alert and oriented   Ambulation: no difficulty   Pertinent Info/Safety Concern: None   Report called to receiving RN?: No

## 2021-06-28 NOTE — ED Notes (Signed)
Pt to CT at this time.
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
## 2021-06-28 NOTE — ED Notes (Signed)
Dr. Salem at bedside

## 2021-06-28 NOTE — Plan of Care (Addendum)
NURSE NOTE SUMMARY  [facility] [address]   Patient Name: Clayton Davis   Attending Physician: Clabe Seal, MD   Today's date:   06/28/2021 LOS: 0 days   Shift Summary:                                                              Assumed care of Pt. Assessment complete. IV flushed and patent. Pt resting in bed with wife at bedside.   Pain management spoke with patient and got Methadone order confirmed and placed   Per ID, Vancomycin stopped and held until aspiration was completed.  1435: Pt taken to IR for aspiration. Pt told IR that he ate a brownie and frappachino around 11. Procedure cancelled. MD notified. Pt placed back on regular diet  Antibiotics on hold until aspiration is complete on Monday per ID. If Pt becomes increasingly sick/febrile notify MD and abx may be restarted.  Pt cooperative with care throughout shift  Report given to oncoming RN      Provider Notifications:        Rapid Response Notifications:  Mobility:      PMP Activity: Step 3 - Bed Mobility (06/28/2021  8:50 AM)     Weight tracking:  Family Dynamic:   Last 3 Weights for the past 72 hrs (Last 3 readings):   Weight   06/28/21 0221 86.2 kg (190 lb)             Last Bowel Movement   No data recorded        Problem: Compromised Hemodynamic Status  Goal: Vital signs and fluid balance maintained/improved  06/28/2021 1243 by Asa Lente, Ginny Loomer L, RN  Outcome: Progressing  Flowsheets (Taken 06/28/2021 1243)  Vital signs and fluid balance are maintained/improved:   Position patient for maximum circulation/cardiac output   Monitor/assess vitals and hemodynamic parameters with position changes   Monitor and compare daily weight  06/28/2021 1242 by Keiston Manley L, RN  Outcome: Progressing     Problem: Impaired Mobility  Goal: Mobility/Activity is maintained at optimal level for patient  06/28/2021 1243 by Esbeydi Manago L, RN  Outcome: Progressing  Flowsheets (Taken 06/28/2021 1243)  Mobility/activity is maintained at  optimal level for patient:   Increase mobility as tolerated/progressive mobility   Encourage independent activity per ability   Maintain proper body alignment   Plan activities to conserve energy, plan rest periods   Assess for changes in respiratory status, level of consciousness and/or development of fatigue   Reposition patient every 2 hours and as needed unless able to reposition self  06/28/2021 1242 by Leonte Horrigan L, RN  Outcome: Progressing     Problem: Peripheral Neurovascular Impairment  Goal: Extremity color, movement, sensation are maintained or improved  06/28/2021 1243 by Moneka Mcquinn L, RN  Outcome: Progressing  Flowsheets (Taken 06/28/2021 1243)  Extremity color, movement, sensation are maintained or improved:   Increase mobility as tolerated/progressive mobility   Assess and monitor application of corrective devices (cast, brace, splint), check skin integrity   Teach/review/reinforce ankle pump exercises   Assess extremity for proper alignment  06/28/2021 1242 by Gilberte Gorley L, RN  Outcome: Progressing     Problem: Compromised skin integrity  Goal: Skin integrity is maintained or improved  06/28/2021 1243  by Dione Plover, RN  Outcome: Progressing  Flowsheets (Taken 06/28/2021 1243)  Skin integrity is maintained or improved:   Increase activity as tolerated/progressive mobility   Assess Braden Scale every shift   Relieve pressure to bony prominences   Encourage use of lotion/moisturizer on skin   Keep skin clean and dry  06/28/2021 1242 by Moksha Dorgan L, RN  Outcome: Progressing     Problem: Pain interferes with ability to perform ADL  Goal: Pain at adequate level as identified by patient  Outcome: Progressing  Flowsheets (Taken 06/28/2021 1243)  Pain at adequate level as identified by patient:   Identify patient comfort function goal   Assess for risk of opioid induced respiratory depression, including snoring/sleep apnea. Alert healthcare team of risk factors identified.    Assess pain on admission, during daily assessment and/or before any "as needed" intervention(s)   Reassess pain within 30-60 minutes of any procedure/intervention, per Pain Assessment, Intervention, Reassessment (AIR) Cycle     Problem: Side Effects from Pain Analgesia  Goal: Patient will experience minimal side effects of analgesic therapy  Outcome: Progressing  Flowsheets (Taken 06/28/2021 1243)  Patient will experience minimal side effects of analgesic therapy: Assess for changes in cognitive function

## 2021-06-28 NOTE — Progress Notes (Addendum)
Clayton Davis Bible seen in person .  Able to move legs but painfull.  Consult tele ID  Consult pain mx  Consult IR for aspiration of lumbar spine for cx   IV abx .       For now he prefers to be discrete of his recent  drug use from his wife.       Addendum:  Apparently before noon patient managed to eat brown and Frappuccino even though he was made n.p.o..  I therefore IR procedure aborted.  I was notified by IR PA they are scheduling it for Monday.      ID physician Dr. Luisa Hart communicated with me.  She advised not to use antibiotic as patient is stable.  She is okay for antibiotic to resume over the weekend if patient keeps getting sick and febrile.  The idea behind this is so that they specimen from the aspiration would be high yield to give [place] a more targeted antibiotic therapy.       Clayton Seal MD  Internal Medicine Hospitalist
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
## 2021-06-28 NOTE — ED Notes (Signed)
MD ordered 10mg  IV Valium to be given prior to leaving with transport to go to MRI d/t being claustrophobic and not having successful MRIs in the past. Pt to go to room after MRI. Nurse called and made aware.

## 2021-06-28 NOTE — ED Provider Notes (Cosign Needed)
[facility]  EMERGENCY DEPARTMENT  History and Physical Exam       Patient Name: Clayton Davis, Clayton Davis  Encounter Date:  06/28/2021  Physician Assistant: Boykin Peek, PA-C  Attending Physician: Magdalene Molly, MD  PCP: Marisa Sprinkles, MD  Patient DOB:  06-21-1991  MRN:  [id number]  Room:  4533/4533-A    History of Presenting Illness     Chief complaint: Abdominal Pain    HPI/ROS given by: Patient    Triage note: Reports three days of epigastric abdominal pain, worsening today. Reports "it feels like something ruptured in my chest."  Denies n/v, fevers, Gu symptoms. Did not take anything pta for pain. Pale in appearance.    Clayton Davis is a 30 y.o. male who presents with back pain with radiation to the abdomen.  Has been experiencing low back pain for several weeks after a fall on steps.  He has been seen here twice for this.  He had a CT abdomen pelvis at 1 point that demonstrated an L4-5 disc bulge.  He is scheduled for outpatient MRI.  He complains of severely worsening pain over the past 3 days.  The pain radiates to the right lower quadrant of his abdomen.  He feels like something is "exploding" inside of him.  He denies fever or chills.  Denies loss of bowel or bladder control or any saddle anesthesia or lower extremity weakness.  He has a history of IV drug abuse and notes that he relapsed recently.     Review of Systems     Review of Systems   Constitutional:  Negative for chills, fever and unexpected weight change.   Respiratory:  Negative for shortness of breath.    Cardiovascular:  Negative for chest pain.   Gastrointestinal:  Negative for abdominal distention, abdominal pain, blood in stool, constipation, diarrhea, nausea and vomiting.        No incontinence     Genitourinary:  Negative for difficulty urinating, dysuria, flank pain, frequency, hematuria and urgency.        No incontinence or retention   Musculoskeletal:  Positive for back pain.   Skin:  Negative for pallor.   Neurological:   Negative for speech difficulty, weakness and numbness.   Psychiatric/Behavioral:  Negative for agitation.    All other systems reviewed and are negative.     Allergies & Medications     Pt is allergic to quetiapine.    Current Discharge Medication List        CONTINUE these medications which have NOT CHANGED    Details   acetaminophen (TYLENOL) 325 MG tablet Take 2 tablets (650 mg total) by mouth every 6 (six) hours as needed for Pain  Refills: 0      amphetamine-dextroamphetamine (ADDERALL) 15 MG tablet Take 15 mg by mouth daily      atorvastatin (Lipitor) 40 MG tablet Take 1 tablet (40 mg total) by mouth daily  Qty: 90 tablet, Refills: 3      buPROPion XL (WELLBUTRIN XL) 150 MG 24 hr tablet TAKE 1 TABLET BY MOUTH EVERY DAY  Qty: 90 tablet, Refills: 1      busPIRone (BUSPAR) 5 MG tablet Take 15 mg by mouth 2 (two) times daily         clonazePAM (KlonoPIN) 0.5 MG tablet       FLUoxetine (PROzac) 10 MG capsule Take 20 mg by mouth every morning         guaiFENesin (Mucinex) 600 MG 12 hr tablet  Take 2 tablets (1,200 mg total) by mouth 2 (two) times daily  Qty: 40 tablet, Refills: 0      HYDROcodone-acetaminophen (NORCO) 5-325 MG per tablet Take 1-2 tablets by mouth every 6 (six) hours as needed for Pain  Qty: 12 tablet, Refills: 0      hydrOXYzine (ATARAX) 25 MG tablet       !! ibuprofen (ADVIL) 600 MG tablet Take 1 tablet (600 mg total) by mouth every 6 (six) hours as needed for Pain  Qty: 40 tablet, Refills: 0      !! ibuprofen (ADVIL) 800 MG tablet Take 1 tablet (800 mg total) by mouth every 8 (eight) hours as needed for Pain  Qty: 21 tablet, Refills: 0      lidocaine (LIDODERM) 5 % Place 1 patch onto the skin every 24 hours Remove & Discard patch within 12 hours or as directed by MD  Qty: 10 patch, Refills: 0      butalbital-acetaminophen-caffeine (Esgic) 50-325-40 MG per tablet Take 1 tablet by mouth every 4 (four) hours as needed for Headaches  Qty: 30 tablet, Refills: 0      methylPREDNISolone (MEDROL DOSEPAK) 4  MG tablet follow package directions  Qty: 21 tablet, Refills: 0      naloxone (NARCAN) 4 MG/0.1ML nasal spray 1 spray intranasally. If pt does not respond or relapses into respiratory depression call 911. Give additional doses every 2-3 min.  Qty: 2 each, Refills: 0    Comments: Dispense #1 twin pack      ondansetron (Zofran ODT) 8 MG disintegrating tablet Take 1 tablet (8 mg total) by mouth every 12 (twelve) hours as needed for Nausea  Qty: 6 tablet, Refills: 0       !! - Potential duplicate medications found. Please discuss with provider.           Past Medical History     Pt has a past medical history of ADHD, Anxiety, Depression, No known health problems, and Sleep apnea.     Past Surgical History     Pt  has a past surgical history that includes No past surgeries.     Family History     The family history includes Cancer in his maternal grandmother; Diabetes in his maternal grandfather; No known problems in his brother, father, maternal aunt, maternal uncle, mother, paternal aunt, paternal grandfather, paternal grandmother, paternal uncle, and sister.     Social History     Pt reports that he has been smoking cigarettes. He has never used smokeless tobacco. He reports current alcohol use. He reports that he does not currently use drugs.     Physical Exam     Blood pressure 111/59, pulse 61, temperature 98.2 F (36.8 C), temperature source Temporal, resp. rate 16, height 1.803 m, weight 86.2 kg, SpO2 90 %.    Constitutional: Well developed, well nourished, active, in no apparent distress.  HENT:   Head: Normocephalic, atraumatic  Ears: No external lesions.  Nose: No external lesions. No epistaxis or drainage.  Eyes: PERRL. No scleral icterus. No conjunctival injection. EOMI.  Neck: Trachea is midline. No JVD. Normal range of motion. No apparent masses.  Cardiovascular: Regular rhythm, S1 normal and S2 normal. No murmur heard.  Pulmonary/Chest: Effort normal. Lungs clear to auscultation bilaterally.   Abdominal:  TTP right lower quadrant with guarding.  Genitourinay/Anorectal: Defferred  Musculoskeletal: Normal range of motion of extremities. No deformity or apparent injury.   Neurological: Pt is alert. Cranial nerves are grossly intact.  Moving all extremities without apparent deficit.   Psychiatric: Affect is appropriate. There is no agitation.   Skin: Skin is warm, dry, well perfused. No rash noted.      Diagnostic Results     The results of the diagnostic studies below have been reviewed by myself:    Labs  Results       Procedure Component Value Units Date/Time    Magnesium [[id number]] Collected: 06/28/21 0310    Specimen: Plasma Updated: 06/28/21 1544     Magnesium 1.8 mg/dL     Blood Culture - Venipuncture [[id number]] Collected: 06/28/21 0717    Specimen: Blood from Venipuncture Updated: 06/28/21 1410     Culture Result Culture In Progress    Prothrombin time/INR [[id number]]  (Abnormal) Collected: 06/28/21 0804    Specimen: Blood Updated: 06/28/21 0849     PT 12.5 sec      PT INR 1.2    Blood Culture - Venipuncture [[id number]] Collected: 06/28/21 0450    Specimen: Blood from Venipuncture Updated: 06/28/21 0715     Culture Result --     Blood culture volume for this collection was less than the optimal needed to acheive adequate sensitivity.  Culture In Progress      Sedimentation rate (ESR) [[id number]]  (Abnormal) Collected: 06/28/21 0310    Specimen: Blood Updated: 06/28/21 0554     Sed Rate 44 mm/hr     Urine Drug Screen - No Confirmation [[id number]]  (Abnormal) Collected: 06/28/21 0455    Specimen: Urine, Random Updated: 06/28/21 0532     Urine Creatinine Random 107 mg/dL      pH, Urine 5.6 pH      Urine Specific Gravity 1.017     Urine Amphetamine Negative     Urine Barbiturates Negative     Urine Benzodiazepines Presumptive Positive     Urine Cannabinoids Negative     Urine Cocaine Negative     Urine Methadone Screen Presumptive Positive     Urine Opiates Presumptive Positive     Urine Oxycodone Negative      Urine Phencyclidine Negative     Propoxyphene Negative     Urine Buprenorphine Negative     Urine Ecstasy Screen Negative     Urine Fentanyl Screen Negative    Narrative:      This is a screening test only.  Any presumptive positive result should be confirmed by more definitive methodologies. Any unconfirmed results should be used for medical purposes only.  Samples with creatinine concentrations <20 mg/dl are considered suspect and resubmission is recommended.  The following thresholds are the minimal levels for detection:  Amphetamine    1000 ng/mL  Methadone     300 ng/mL  Barbiturate     200 ng/mL  Opiate        300 ng/mL  Benzodiazepine  200 ng/mL  Oxycodone     100 ng/mL  Cannabinoid      50 ng/mL  Phenyclidine   25 ng/mL  Cocaine         300 ng/mL  Propoxyphene  300 ng/mL  Buprenorphine     5 ng/mL  Ecstasy       500 ng/mL  Fentanyl          2 ng/mL    C Reactive Protein [[id number]]  (Abnormal) Collected: 06/28/21 0310    Specimen: Plasma Updated: 06/28/21 0450     C-Reactive Protein 7.59 mg/dL     Ethanol (Alcohol) Level [[id number]] Collected: 06/28/21 0310  Specimen: Plasma Updated: 06/28/21 0440     Alcohol <10 mg/dL     Lipase [[id number]] Collected: 06/28/21 0310    Specimen: Plasma Updated: 06/28/21 0348     Lipase 43 U/L     Comprehensive metabolic panel [[id number]]  (Abnormal) Collected: 06/28/21 0310    Specimen: Plasma Updated: 06/28/21 0345     Sodium 140 mMol/L      Potassium 4.4 mMol/L      Chloride 99 mMol/L      CO2 31 mMol/L      Calcium 10.2 mg/dL      Glucose 90 mg/dL      Creatinine 7.82 mg/dL      BUN 23 mg/dL      Protein, Total 8.7 gm/dL      Albumin 3.3 gm/dL      Alkaline Phosphatase 43 U/L      ALT 16 U/L      AST (SGOT) 18 U/L      Bilirubin, Total 0.3 mg/dL      Albumin/Globulin Ratio 0.61 Ratio      Anion Gap 14.4 mMol/L      BUN / Creatinine Ratio 25.3 Ratio      EGFR 116 mL/min/1.35m2      Osmolality Calculated 283 mOsm/kg      Globulin 5.4 gm/dL     CBC and differential  [[id number]]  (Abnormal) Collected: 06/28/21 0310    Specimen: Blood Updated: 06/28/21 0335     WBC 14.1 K/cmm      RBC 4.80 M/cmm      Hemoglobin 11.4 gm/dL      Hematocrit 57.8 %      MCV 82 fL      MCH 24 pg      MCHC 29 gm/dL      RDW 46.9 %      PLT CT 543 K/cmm      MPV 7.2 fL      Neutrophils % 72.4 %      Lymphocytes 16.2 %      Monocytes 9.0 %      Eosinophils % 2.1 %      Basophils % 0.3 %      Neutrophils Absolute 10.2 K/cmm      Lymphocytes Absolute 2.3 K/cmm      Monocytes Absolute 1.3 K/cmm      Eosinophils Absolute 0.3 K/cmm      Basophils Absolute 0.0 K/cmm             Radiologic Studies  MRI Lumbar Spine W WO Contrast    Result Date: 06/28/2021  IMPRESSION: Osteomyelitis-discitis at L4-5 is confirmed. No abscess  ReadingStation:[facility]    CT Abdomen Pelvis with IV Cont    Result Date: 06/28/2021  No mass lesion identified. Osteomyelitis/discitis at L4-5. Associated circumferential disc bulge is worse. Subtle stranding in the subcutaneous fat over right buttock is new may suggest contusion  ReadingStation:[facility]     EKG:      ED Meds     ED Medication Orders (From admission, onward)      Start Ordered     Status Ordering Provider    06/28/21 2100 06/28/21 0503    Every 12 hours        Route: Intravenous  Ordered Dose: 1,500 mg     Discontinued SALEM, HAGER    06/28/21 0900 06/28/21 0443  sodium chloride (PF) 0.9 % injection 3 mL  Every 12 hours scheduled        Route: Intravenous  Ordered Dose: 3 mL     Last MAR action: Given SALEM, HAGER    06/28/21 0900 06/28/21 0443  lactobacillus species (BIO-K PLUS) capsule 50 Billion CFU  Daily        Route: Oral  Ordered Dose: 50 Billion CFU     Last MAR action: Given SALEM, HAGER    06/28/21 0619 06/28/21 0620  gadoterate meglumine (DOTAREM) injection 9.5 mmol  IMG once as needed        Route: Intravenous  Ordered Dose: 19 mL     Last MAR action: Imaging Agent Given SALEM, HAGER    06/28/21 0600 06/28/21 0443    Every 8 hours scheduled        Route:  Subcutaneous  Ordered Dose: 5,000 Units     Discontinued SALEM, HAGER    06/28/21 0506 06/28/21 0506  LORazepam (ATIVAN) tablet 0.5 mg  Every 4 hours PRN        Route: Oral  Ordered Dose: 0.5 mg     Acknowledged SALEM, HAGER    06/28/21 0505 06/28/21 0504  diazePAM (VALIUM) injection 5 mg  Once        Route: Intravenous  Ordered Dose: 5 mg     Last MAR action: Given PIANALTO, DAVID B    06/28/21 0500 06/28/21 0443    Every 12 hours        Route: Intravenous  Ordered Dose: 2 g     Discontinued SALEM, HAGER    06/28/21 0455 06/28/21 0454  vancomycin (VANCOCIN) 1,500 mg in sodium chloride 0.9 % 250 mL IVPB (vial-mate)  Once        Route: Intravenous  Ordered Dose: 1,500 mg     Last [facility] action: [employer], HAGER    06/28/21 0446 06/28/21 0446    See admin instructions        Route: Does not apply     Discontinued SALEM, HAGER    06/28/21 0444 06/28/21 0443    Every 12 hours        Route: Intravenous  Ordered Dose: 1,000 mg     Discontinued SALEM, HAGER    06/28/21 0443 06/28/21 0443  morphine injection 1 mg  Every 4 hours PRN        Route: Intravenous  Ordered Dose: 1 mg     Acknowledged Jannette Fogo    06/28/21 0443 06/28/21 0443    Every 4 hours PRN        Route: Oral  Ordered Dose: 1-2 tablet     Discontinued SALEM, HAGER    06/28/21 0443 06/28/21 0443  acetaminophen (TYLENOL) tablet 650 mg  Every 4 hours PRN        Route: Oral  Ordered Dose: 650 mg    See Hyperspace for full Linked Orders Report.    Acknowledged SALEM, HAGER    06/28/21 0443 06/28/21 0443  acetaminophen (TYLENOL) 160 MG/5ML oral solution 650 mg  Every 4 hours PRN        Route: per NG tube  Ordered Dose: 650 mg    See Hyperspace for full Linked Orders Report.    Acknowledged SALEM, HAGER    06/28/21 0443 06/28/21 0443  acetaminophen (TYLENOL) suppository 650 mg  Every 4 hours PRN        Route: Rectal  Ordered Dose: 650 mg    See Hyperspace for full Linked Orders Report.    Acknowledged SALEM, HAGER    06/28/21 0442 06/28/21 0443  naloxone (NARCAN)  injection 0.4 mg  As needed        Route: Intravenous  Ordered Dose: 0.4 mg     Acknowledged SALEM, HAGER    06/28/21 0353 06/28/21 0352  ketorolac (TORADOL) injection 15 mg  Once in ED        Route: Intravenous  Ordered Dose: 15 mg     Last MAR action: Given Boykin Peek    06/28/21 0353 06/28/21 0352  morphine injection 4 mg  Once in ED        Route: Intravenous  Ordered Dose: 4 mg     Last MAR action: Given Boykin Peek    06/28/21 0353 06/28/21 0352  ondansetron (ZOFRAN) injection 4 mg  Once in ED        Route: Intravenous  Ordered Dose: 4 mg     Last MAR action: Given Boykin Peek    06/28/21 0333 06/28/21 0333  iohexol (OMNIPAQUE) 350 MG/ML injection 100 mL  IMG once as needed        Route: Intravenous  Ordered Dose: 100 mL     Last MAR action: Imaging Agent Given PIANALTO, DAVID B             ED Course and Medical Decision Making     Old medical records and nursing/triage notes were reviewed by myself    DIAGNOSTIC CONSIDERATIONS    Lumbar strain, discitis, osteomyelitis, spinal epidural abscess    CONSULTS    Dr. Karel Jarvis (hospitalist)-discussed case and will consult for admission.    ED COURSE & MDM    Patient evaluated for worsening low back pain after recently relapsing on IV drugs.  Reports some recent injury, however his CT today demonstrates L4-5 discitis with vertebral osteomyelitis.  There is no evidence of spinal epidural abscess on CT but an MRI was ordered.  He has no neurologic deficits.  Blood cultures were drawn.  Case discussed with medicine service.  Defer antibiotic selection to them as requested.    I discussed this case with Dr. Micah Flesher in the emergency department who also directly examined the patient and agrees with the assessment and treatment plan.     In addition to the above history, please see nursing notes. Allergies, meds, past medical, family, social hx, and the results of the diagnostic studies performed have been reviewed by myself.    This chart was generated by an EMR and  may contain errors or omissions not intended by the user.     Procedures / Critical Care     None     Diagnosis / Disposition     Clinical Impression  1. Lumbar discitis    2. Acute osteomyelitis of lumbar spine    3. IV drug abuse        Disposition  ED Disposition       ED Disposition   Admit    Condition   --    Date/Time   Fri Jun 28, 2021  4:37 AM    Comment   Service: Medicine [106]                 Follow up for Discharged Patients  No follow-up provider specified.    Prescriptions for Discharged Patients  Current Discharge Medication List                    Boykin Peek, [place]  06/28/21 9811

## 2021-06-28 NOTE — Teleconsult (Signed)
The patient's informed consent to perform this consultation using telehealth tools was obtained: Yes    Telehealth patient education given prior to telehealth session:Yes    Initial video consult by Dr.Cornett and assessment completed. Questions answered and plan of care reviewed with patient.    Start Time: 1224  End Time: 1258    Staff present during the patient's telehealth session: Yes  Lenard Forth, RN

## 2021-06-28 NOTE — ED Triage Notes (Signed)
Pt reports he had a spinal injury recently and was unsure if the abdominal pain was related, pt came in with abdominal pain that he describes as a burning, tearing pain that radiates throughout his abd.

## 2021-06-29 LAB — ECG 12-LEAD
P Wave Axis: 39 deg
P-R Interval: 131 ms
Patient Age: 29 years
Q-T Interval(Corrected): 522 ms
Q-T Interval: 522 ms
QRS Axis: 36 deg
QRS Duration: 84 ms
T Axis: 12 years
Ventricular Rate: 60 //min

## 2021-06-29 LAB — CBC AND DIFFERENTIAL
Basophils %: 0.5 % (ref 0.0–3.0)
Basophils Absolute: 0 10*3/uL (ref 0.0–0.3)
Eosinophils %: 4.1 % (ref 0.0–7.0)
Eosinophils Absolute: 0.4 10*3/uL (ref 0.0–0.8)
Hematocrit: 35.1 % — ABNORMAL LOW (ref 39.0–52.5)
Hemoglobin: 10.3 gm/dL — ABNORMAL LOW (ref 13.0–17.5)
Lymphocytes Absolute: 3 10*3/uL (ref 0.6–5.1)
Lymphocytes: 29.6 % (ref 15.0–46.0)
MCH: 24 pg — ABNORMAL LOW (ref 28–35)
MCHC: 29 gm/dL — ABNORMAL LOW (ref 32–36)
MCV: 82 fL (ref 80–100)
MPV: 7.5 fL (ref 6.0–10.0)
Monocytes Absolute: 1.1 10*3/uL (ref 0.1–1.7)
Monocytes: 10.9 % (ref 3.0–15.0)
Neutrophils %: 54.9 % (ref 42.0–78.0)
Neutrophils Absolute: 5.5 10*3/uL (ref 1.7–8.6)
PLT CT: 471 10*3/uL — ABNORMAL HIGH (ref 130–440)
RBC: 4.28 10*6/uL (ref 4.00–5.70)
RDW: 12.2 % (ref 11.0–14.0)
WBC: 10 10*3/uL (ref 4.0–11.0)

## 2021-06-29 LAB — IRON PROFILE
% Saturation: 11 % — ABNORMAL LOW (ref 15–50)
Iron: 27.2 ug/dL — ABNORMAL LOW (ref 50.0–175.0)
TIBC: 241 ug/dL — ABNORMAL LOW (ref 250–450)
Transferrin: 172 mg/dL — ABNORMAL LOW (ref 174.0–364.0)

## 2021-06-29 LAB — COMPREHENSIVE METABOLIC PANEL
ALT: 13 U/L (ref 0–55)
AST (SGOT): 13 U/L (ref 10–42)
Albumin/Globulin Ratio: 0.61 Ratio — ABNORMAL LOW (ref 0.80–2.00)
Albumin: 2.8 gm/dL — ABNORMAL LOW (ref 3.5–5.0)
Alkaline Phosphatase: 37 U/L — ABNORMAL LOW (ref 40–145)
Anion Gap: 16.3 mMol/L (ref 7.0–18.0)
BUN / Creatinine Ratio: 25.4 Ratio (ref 10.0–30.0)
BUN: 18 mg/dL (ref 7–22)
Bilirubin, Total: 0.3 mg/dL (ref 0.1–1.2)
CO2: 26 mMol/L (ref 20–30)
Calcium: 9.7 mg/dL (ref 8.5–10.5)
Chloride: 102 mMol/L (ref 98–110)
Creatinine: 0.71 mg/dL — ABNORMAL LOW (ref 0.80–1.30)
EGFR: 127 mL/min/{1.73_m2} (ref 60–150)
Globulin: 4.6 gm/dL — ABNORMAL HIGH (ref 2.0–4.0)
Glucose: 118 mg/dL — ABNORMAL HIGH (ref 71–99)
Osmolality Calculated: 282 mOsm/kg (ref 275–300)
Potassium: 4.3 mMol/L (ref 3.5–5.3)
Protein, Total: 7.4 gm/dL (ref 6.0–8.3)
Sodium: 140 mMol/L (ref 136–147)

## 2021-06-29 LAB — VITAMIN B12 AND FOLATE
Folate: 11 ng/mL (ref 7.0–19.9)
Vitamin B-12: 745 pg/mL (ref 213–816)

## 2021-06-29 LAB — FERRITIN: Ferritin: 366.2 ng/mL — ABNORMAL HIGH (ref 21.8–274.6)

## 2021-06-29 LAB — MAGNESIUM: Magnesium: 1.9 mg/dL (ref 1.6–2.6)

## 2021-06-29 LAB — PHOSPHORUS: Phosphorus: 5 mg/dL — ABNORMAL HIGH (ref 2.3–4.7)

## 2021-06-29 MED ORDER — FLUOXETINE HCL 10 MG PO CAPS
10.0000 mg | ORAL_CAPSULE | Freq: Every morning | ORAL | Status: DC
Start: 2021-06-30 — End: 2021-06-29

## 2021-06-29 MED ORDER — LIDOCAINE 5 % EX PTCH
1.0000 | MEDICATED_PATCH | CUTANEOUS | Status: DC
Start: 2021-06-29 — End: 2021-07-09
  Administered 2021-06-29 – 2021-07-02 (×4): 1 via TRANSDERMAL
  Filled 2021-06-29 (×5): qty 1

## 2021-06-29 MED ORDER — FAMOTIDINE 20 MG PO TABS
20.0000 mg | ORAL_TABLET | Freq: Two times a day (BID) | ORAL | Status: DC
Start: 2021-06-29 — End: 2021-07-09
  Administered 2021-06-29 – 2021-07-09 (×20): 20 mg via ORAL
  Filled 2021-06-29 (×21): qty 1

## 2021-06-29 MED ORDER — FLUOXETINE HCL 10 MG PO CAPS
10.0000 mg | ORAL_CAPSULE | Freq: Every morning | ORAL | Status: DC
Start: 2021-06-29 — End: 2021-07-09
  Administered 2021-06-29 – 2021-07-09 (×11): 10 mg via ORAL
  Filled 2021-06-29 (×11): qty 1

## 2021-06-29 MED ORDER — SODIUM CHLORIDE 0.9 % IV SOLN
INTRAVENOUS | Status: DC
Start: 2021-06-29 — End: 2021-07-02
  Administered 2021-06-29 – 2021-06-30 (×2): 950 mL via INTRAVENOUS

## 2021-06-29 MED ORDER — ENOXAPARIN SODIUM 40 MG/0.4ML IJ SOSY
40.0000 mg | PREFILLED_SYRINGE | Freq: Every day | INTRAMUSCULAR | Status: AC
Start: 2021-06-29 — End: 2021-06-30
  Administered 2021-06-29 – 2021-06-30 (×2): 40 mg via SUBCUTANEOUS
  Filled 2021-06-29 (×2): qty 0.4

## 2021-06-29 MED ORDER — ENOXAPARIN SODIUM 40 MG/0.4ML IJ SOSY
40.0000 mg | PREFILLED_SYRINGE | Freq: Every day | INTRAMUSCULAR | Status: DC
Start: 2021-06-29 — End: 2021-06-29

## 2021-06-29 NOTE — Plan of Care (Addendum)
NURSE NOTE SUMMARY  [facility] [address]   Patient Name: Clayton Davis   Attending Physician: Clabe Seal, MD   Today's date:   06/29/2021 LOS: 1 days   Shift Summary:                                                              Pt up to BR w/ supervision and walker   Pain was controlled with scheduled Methadone, Tylenol, and Advil   IV intact- INT   Pt is voiding WNL in the BR  Wife at bedside during shift   Neuro-vascular assessment is intact.  Vital  signs reviewed:    Temp: 98.2 F (36.8 C), Heart Rate: (!) 54, Resp Rate: 14, BP: 110/63    Report given to accepting RN     End of shift.        Provider Notifications:        Rapid Response Notifications:  Mobility:      PMP Activity: Step 6 - Walks in Room (06/28/2021  7:45 PM)     Weight tracking:  Family Dynamic:   Last 3 Weights for the past 72 hrs (Last 3 readings):   Weight   06/28/21 0221 86.2 kg (190 lb)             Last Bowel Movement   No data recorded        Problem: Impaired Mobility  Goal: Mobility/Activity is maintained at optimal level for patient  Outcome: Progressing  Flowsheets (Taken 06/28/2021 1243 by Asa Lente, Addison L, RN)  Mobility/activity is maintained at optimal level for patient:   Increase mobility as tolerated/progressive mobility   Encourage independent activity per ability   Maintain proper body alignment   Plan activities to conserve energy, plan rest periods   Assess for changes in respiratory status, level of consciousness and/or development of fatigue   Reposition patient every 2 hours and as needed unless able to reposition self     Problem: Peripheral Neurovascular Impairment  Goal: Extremity color, movement, sensation are maintained or improved  Outcome: Progressing  Flowsheets (Taken 06/28/2021 1243 by Orndorff, Addison L, RN)  Extremity color, movement, sensation are maintained or improved:   Increase mobility as tolerated/progressive mobility   Assess and monitor application of corrective  devices (cast, brace, splint), check skin integrity   Teach/review/reinforce ankle pump exercises   Assess extremity for proper alignment     Problem: Pain interferes with ability to perform ADL  Goal: Pain at adequate level as identified by patient  Outcome: Progressing

## 2021-06-29 NOTE — Plan of Care (Addendum)
NURSE NOTE SUMMARY  [facility] [address]   Patient Name: Clayton Davis   Attending Physician: Clabe Seal, MD   Today's date:   06/29/2021 LOS: 1 days   Shift Summary:                                                              0700- Received report, assumed care. Patient awake and alert, slow to respond - appropriate. Assessment complete, IV intact and saline locked. Neurovascular intact.     PRN oxy given for pain  Up to the bathroom w/ 1, BM reported  EKG completed  Started on IV fluids    1845- Vitals stable, call bell within reach, all questions/concerns addressed. Report given to oncoming RN.      Provider Notifications:        Rapid Response Notifications:  Mobility:      PMP Activity: Step 6 - Walks in Room (06/29/2021  8:15 AM)     Weight tracking:  Family Dynamic:   Last 3 Weights for the past 72 hrs (Last 3 readings):   Weight   06/28/21 0221 86.2 kg (190 lb)             Last Bowel Movement   No data recorded        Problem: Compromised Hemodynamic Status  Goal: Vital signs and fluid balance maintained/improved  Outcome: Progressing  Flowsheets (Taken 06/28/2021 1243 by Asa Lente, Addison L, RN)  Vital signs and fluid balance are maintained/improved:   Position patient for maximum circulation/cardiac output   Monitor/assess vitals and hemodynamic parameters with position changes   Monitor and compare daily weight     Problem: Impaired Mobility  Goal: Mobility/Activity is maintained at optimal level for patient  Outcome: Progressing  Flowsheets (Taken 06/28/2021 1243 by Asa Lente, Addison L, RN)  Mobility/activity is maintained at optimal level for patient:   Increase mobility as tolerated/progressive mobility   Encourage independent activity per ability   Maintain proper body alignment   Plan activities to conserve energy, plan rest periods   Assess for changes in respiratory status, level of consciousness and/or development of fatigue   Reposition patient every 2 hours and as  needed unless able to reposition self     Problem: Peripheral Neurovascular Impairment  Goal: Extremity color, movement, sensation are maintained or improved  Outcome: Progressing  Flowsheets (Taken 06/28/2021 1243 by Orndorff, Addison L, RN)  Extremity color, movement, sensation are maintained or improved:   Increase mobility as tolerated/progressive mobility   Assess and monitor application of corrective devices (cast, brace, splint), check skin integrity   Teach/review/reinforce ankle pump exercises   Assess extremity for proper alignment     Problem: Compromised skin integrity  Goal: Skin integrity is maintained or improved  Outcome: Progressing  Flowsheets (Taken 06/28/2021 1243 by Asa Lente, Addison L, RN)  Skin integrity is maintained or improved:   Increase activity as tolerated/progressive mobility   Assess Braden Scale every shift   Relieve pressure to bony prominences   Encourage use of lotion/moisturizer on skin   Keep skin clean and dry     Problem: Pain interferes with ability to perform ADL  Goal: Pain at adequate level as identified by patient  Outcome: Progressing  Flowsheets (Taken 06/28/2021 1243 by Asa Lente,  Addison L, RN)  Pain at adequate level as identified by patient:   Identify patient comfort function goal   Assess for risk of opioid induced respiratory depression, including snoring/sleep apnea. Alert healthcare team of risk factors identified.   Assess pain on admission, during daily assessment and/or before any "as needed" intervention(s)   Reassess pain within 30-60 minutes of any procedure/intervention, per Pain Assessment, Intervention, Reassessment (AIR) Cycle     Problem: Side Effects from Pain Analgesia  Goal: Patient will experience minimal side effects of analgesic therapy  Outcome: Progressing  Flowsheets (Taken 06/28/2021 1243 by Asa Lente, Addison L, RN)  Patient will experience minimal side effects of analgesic therapy: Assess for changes in cognitive function     Problem:  Moderate/High Fall Risk Score >5  Goal: Patient will remain free of falls  Outcome: Progressing  Flowsheets (Taken 06/29/2021 0815)  VH Moderate Risk (6-13):   ALL REQUIRED LOW INTERVENTIONS   INITIATE YELLOW "FALL RISK" SIGNAGE   YELLOW NON-SKID SLIPPERS   YELLOW "FALL RISK" ARM BAND   PLACE FALL RISK LEVEL ON WHITE BOARD FOR COMMUNICATION PURPOSES IN PATIENT'S ROOM   USE OF BED EXIT ALARM IF PATIENT IS CONFUSED OR IMPULSIVE. PLACE RESET BED ALARM SIGN ABOVE BED   Request PT/OT therapy consult order from Physician for patients with gait/mobility impairment   Use assistive devices   Use chair-pad alarm device

## 2021-06-29 NOTE — Progress Notes (Signed)
[facility]  PROGRESS NOTE      Patient: Clayton Davis  Date: 06/29/2021   LOS: 1 Days  Admission Date: 06/28/2021   MRN: [id number]  Attending: Clabe Seal, MD       Please contact me via Epic chat for routine matters and via phone [phone] for urgent matters.    INTERIM SUMMARY           SUBJECTIVE     Clayton Davis afebrile   C/o lower back pain   No bm last couple days     NAD otherwise       ASSESSMENT/PLAN     Clayton Davis is a 30 y.o. 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  -X-ray lumbar spine from 05/18/2021 shows possible transverse process fracture  -X-ray lumbar spine from 05/17/2021 shows possible L3 transverse process fracture  -CT abdomen pelvis from 05/26/2021 shows broad-based disc bulge on L4/L5 causing moderate left neuroforaminal narrowing    -Follow-up blood cultures    9/30- Apparently before noon patient managed to eat brown and Frappuccino even though he was made n.p.o..  I therefore IR procedure aborted.  I was notified by IR PA they are scheduling it for Monday.       ID physician Dr. Luisa Hart communicated with me.  She advised not to use antibiotic as patient is stable.  She is okay for antibiotic to resume over the weekend if patient keeps getting sick and febrile.  The idea behind this is so that they specimen from the aspiration would be high yield to give [place] a more targeted antibiotic therapy.      10/01- wbc normalized since yest . add lidocaine patch . Otherwise plan is the same       Opioid dependence  -Patient states he is on 145 methadone  -consulted pain mx       OTC prolangation ( not brand new but needs monitoring )   To mitigate risks--Methadone 40 tid (  dose reduced and separated ) , decrease prozac dose by half, daily ekg , tele, monitor lytes     Anemia  -Hemoglobin 11/39  -Previous hemoglobin 13/42  -Monitor for signs of bleed  -Iron studies   Add prophylactic pepcid    Hyperlipidemia  -Continue statin    Mood disorder/anxiety  -Continue Wellbutrin, buspirone, fluoxetine    Constipation   Bowel regimen       Dispo:   Continue current care   IR Monday   Resume abx if he gets sick     DVT Prophylaxis: Sub Q Lovenox     Code Status: Full Code      MEDICATIONS     Current Facility-Administered Medications   Medication Dose Route Frequency    acetaminophen  650 mg Oral Q6H    atorvastatin  40 mg Oral Daily    buPROPion SR  150 mg Oral Daily    busPIRone  15 mg Oral Q12H SCH    docusate sodium  200 mg Oral BID    enoxaparin  40 mg Subcutaneous Daily    famotidine  20 mg Oral BID AC    [START ON 06/30/2021] FLUoxetine  10 mg Oral QAM    ibuprofen  600 mg Oral 4 times per day    lactobacillus species  50 Billion CFU Oral Daily    lidocaine  1 patch Transdermal Q24H    methadone  40 mg Oral Q8H SCH    sodium chloride (PF)  3 mL Intravenous Q12H SCH       PHYSICAL EXAM     Vitals:    06/29/21 0728   BP: 108/60   Pulse: (!) 57   Resp: 16   Temp: 97.3 F (36.3 C)   SpO2: 95%       Temperature: Temp  Min: 97.3 F (36.3 C)  Max: 98.2 F (36.8 C)  Pulse: Pulse  Min: 54  Max: 61  Respiratory: Resp  Min: 14  Max: 16  Non-Invasive BP: BP  Min: 108/60  Max: 111/59  Pulse Oximetry SpO2  Min: 90 %  Max: 95 %      Constitutional: Seems to be NAD   HEENT: NC/AT, PERRL,EOMI. Neck supple  Cardiovascular: RRytm, normal S1 S2, no murmurs, gallops.  Respiratory:  CTA bilaterally Normal rate. No retractions or increased work of breathing.  Gastrointestinal: Normal BS, non-distended, soft, non-tender, no rebound or guarding.  Genitourinary: no suprapubic or costovertebral angle tenderness  Musculoskeletal: ROM and motor strength grossly normal. No clubbing, edema.   Skin: no rashes,  jaundice , cyanosis  Neurologic: AAO x3.  Able to move lower extremities but painful back   Psychiatric: Flat affect . Cooperative.     LABS     Recent Labs   Lab 06/29/21  0527 06/28/21  0310   WBC 10.0 14.1*   RBC 4.28 4.80   Hemoglobin 10.3* 11.4*   Hematocrit 35.1* 39.2   MCV 82 82   PLT CT 471* 543*       Recent Labs   Lab 06/29/21  0527 06/28/21  0310   Sodium 140 140   Potassium 4.3 4.4   Chloride 102 99   CO2 26 31*   BUN 18 23*   Creatinine 0.71* 0.91   Glucose 118* 90   Calcium 9.7 10.2   Magnesium 1.9 1.8       Recent Labs   Lab 06/29/21  0527 06/28/21  0310   ALT 13 16   AST (SGOT) 13 18   Bilirubin, Total 0.3 0.3   Albumin 2.8* 3.3*   Alkaline Phosphatase 37* 43             Recent Labs   Lab 06/28/21  0804   PT INR 1.2*   PT 12.5*       Microbiology Results (last 15 days)       Procedure Component Value Units Date/Time    Lab Specimen Yes [[id number]]     Order Status: Sent     Abscess Culture and Smear [[id number]]     Order Status: Sent Specimen: Abscess from Spine     Sterile Body Fluid Culture and Smear [[id number]]     Order Status: Sent Specimen: Other from Seroma     Blood Culture - Venipuncture [[id number]] Collected: 06/28/21 0717    Order Status: Completed Specimen: Blood from Venipuncture Updated: 06/28/21 1410     Culture Result Culture In Progress    Blood Culture - Venipuncture [[id number]] Collected: 06/28/21 0450    Order Status: Completed Specimen: Blood from Venipuncture Updated: 06/28/21 0715     Culture Result --     Blood culture volume for this collection was  less than the optimal needed to acheive adequate sensitivity.  Culture In Progress               RADIOLOGY     No results found.    HH needs if any:  There are no questions and answers to display.       Nutrition assessment (if any) in collaboration with medical nutritionist :            Signed,  Clabe Seal, MD  9:03 AM 06/29/2021

## 2021-06-30 LAB — CBC AND DIFFERENTIAL
Basophils %: 0.3 % (ref 0.0–3.0)
Basophils Absolute: 0 10*3/uL (ref 0.0–0.3)
Eosinophils %: 3.7 % (ref 0.0–7.0)
Eosinophils Absolute: 0.4 10*3/uL (ref 0.0–0.8)
Hematocrit: 35.6 % — ABNORMAL LOW (ref 39.0–52.5)
Hemoglobin: 10.9 gm/dL — ABNORMAL LOW (ref 13.0–17.5)
Lymphocytes Absolute: 2.9 10*3/uL (ref 0.6–5.1)
Lymphocytes: 29.2 % (ref 15.0–46.0)
MCH: 25 pg — ABNORMAL LOW (ref 28–35)
MCHC: 31 gm/dL — ABNORMAL LOW (ref 32–36)
MCV: 81 fL (ref 80–100)
MPV: 7.3 fL (ref 6.0–10.0)
Monocytes Absolute: 1 10*3/uL (ref 0.1–1.7)
Monocytes: 10.2 % (ref 3.0–15.0)
Neutrophils %: 56.7 % (ref 42.0–78.0)
Neutrophils Absolute: 5.6 10*3/uL (ref 1.7–8.6)
PLT CT: 444 10*3/uL — ABNORMAL HIGH (ref 130–440)
RBC: 4.39 10*6/uL (ref 4.00–5.70)
RDW: 12 % (ref 11.0–14.0)
WBC: 9.8 10*3/uL (ref 4.0–11.0)

## 2021-06-30 LAB — COMPREHENSIVE METABOLIC PANEL
ALT: 11 U/L (ref 0–55)
AST (SGOT): 11 U/L (ref 10–42)
Albumin/Globulin Ratio: 0.59 Ratio — ABNORMAL LOW (ref 0.80–2.00)
Albumin: 2.6 gm/dL — ABNORMAL LOW (ref 3.5–5.0)
Alkaline Phosphatase: 36 U/L — ABNORMAL LOW (ref 40–145)
Anion Gap: 13.3 mMol/L (ref 7.0–18.0)
BUN / Creatinine Ratio: 19.4 Ratio (ref 10.0–30.0)
BUN: 13 mg/dL (ref 7–22)
Bilirubin, Total: 0.3 mg/dL (ref 0.1–1.2)
CO2: 26 mMol/L (ref 20–30)
Calcium: 9.6 mg/dL (ref 8.5–10.5)
Chloride: 105 mMol/L (ref 98–110)
Creatinine: 0.67 mg/dL — ABNORMAL LOW (ref 0.80–1.30)
EGFR: 129 mL/min/{1.73_m2} (ref 60–150)
Globulin: 4.4 gm/dL — ABNORMAL HIGH (ref 2.0–4.0)
Glucose: 73 mg/dL (ref 71–99)
Osmolality Calculated: 278 mOsm/kg (ref 275–300)
Potassium: 4.3 mMol/L (ref 3.5–5.3)
Protein, Total: 7 gm/dL (ref 6.0–8.3)
Sodium: 140 mMol/L (ref 136–147)

## 2021-06-30 LAB — VITAMIN D,25 OH,TOTAL: Vitamin D 25-Hydroxy: 37.4 ng/mL (ref 30.0–80.0)

## 2021-06-30 MED ORDER — IBUPROFEN 200 MG PO TABS
600.0000 mg | ORAL_TABLET | Freq: Four times a day (QID) | ORAL | Status: AC
Start: 2021-06-30 — End: 2021-06-30
  Administered 2021-06-30: 12:00:00 600 mg via ORAL
  Filled 2021-06-30: qty 3

## 2021-06-30 NOTE — Progress Notes (Addendum)
[facility]  PROGRESS NOTE      Patient: Clayton Davis  Date: 06/30/2021   LOS: 2 Days  Admission Date: 06/28/2021   MRN: [id number]  Attending: Clabe Seal, MD       Please contact me via Epic chat for routine matters and via phone [phone] for urgent matters.    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  C/o lower back pain . As he talk more about his pain he got agitated once he understand pain meds limited bc of his prolonged QTc. Reminded him he is on prn flexeril and 10 mg oxycodone . I seemed surprised . As I educated him more and more about impotence of prolonged QTc he then calmed down .       NAD otherwise       ASSESSMENT/PLAN     Marteze Vecchio is a 30 y.o. 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  -X-ray lumbar spine from 05/18/2021 shows possible transverse process fracture  -X-ray lumbar spine from 05/17/2021 shows possible L3 transverse process fracture  -CT abdomen pelvis from 05/26/2021 shows broad-based disc bulge on L4/L5 causing moderate left neuroforaminal narrowing    -Follow-up blood cultures    9/30- Apparently before noon patient managed to eat brown and Frappuccino even though he was made n.p.o..  I therefore IR procedure aborted.  I was notified by IR PA they are scheduling it for Monday.       ID physician Dr. Luisa Hart communicated with me.  She advised not to use antibiotic as patient is stable.  She is okay for antibiotic to resume over the weekend if patient keeps getting sick and febrile.  The idea behind this is so that  they specimen from the aspiration would be high yield to give [place] a more targeted antibiotic therapy.      10/01- wbc normalized since yest . add lidocaine patch . Otherwise plan is the same . Qtc 558  10/2 for IR bx tomorrow.  Hold Advil after noon dose        Opioid dependence  -Patient states he is on 145 methadone at home  -consulted pain mx   - Methadone 40 Mg tid here      OTC prolangation ( not brand new but needs monitoring )   To mitigate risks--Methadone 40 tid ( dose reduced and separated ) , decrease prozac dose by half, daily ekg , tele, monitor lytes   QTc 558 yest  QTc pending today-- Addendum ekg done -- qtc 524 rate 49 computer reads" borderline ST -elevation lateral leads" I looked at it . I don't see any pathological ST elevations esp when I compare it with older ekgs.       Anemia  -Hemoglobin 11/39 on admission   -Previous hemoglobin 13/42  -Monitor for signs of bleed  -Iron studies -- suggests sequestration   Prophylactic pepcid  Lab Results   Component Value Date    HGB  10.9 (L) 06/30/2021         Hyperlipidemia  -Continue statin    Mood disorder/anxiety  -Continue Wellbutrin, buspirone, fluoxetine    Constipation   Bowel regimen       Dispo:   Continue current care   IR Monday   Resume abx if he gets sick     DVT Prophylaxis: Sub Q Lovenox x1 today . Hold tomorrow       Code Status: Full Code      MEDICATIONS     Current Facility-Administered Medications   Medication Dose Route Frequency    acetaminophen  650 mg Oral Q6H    atorvastatin  40 mg Oral Daily    buPROPion SR  150 mg Oral Daily    busPIRone  15 mg Oral Q12H SCH    docusate sodium  200 mg Oral BID    enoxaparin  40 mg Subcutaneous Daily    famotidine  20 mg Oral BID AC    FLUoxetine  10 mg Oral QAM    ibuprofen  600 mg Oral 4 times per day    lactobacillus species  50 Billion CFU Oral Daily    lidocaine  1 patch Transdermal Q24H    methadone  40 mg Oral Q8H SCH    sodium chloride (PF)  3 mL Intravenous Q12H SCH       PHYSICAL EXAM      Vitals:    06/30/21 0715   BP: 109/67   Pulse: 65   Resp: 16   Temp: 98.8 F (37.1 C)   SpO2: 95%       Temperature: Temp  Min: 98.4 F (36.9 C)  Max: 98.8 F (37.1 C)  Pulse: Pulse  Min: 54  Max: 65  Respiratory: Resp  Min: 12  Max: 16  Non-Invasive BP: BP  Min: 109/67  Max: 119/67  Pulse Oximetry SpO2  Min: 92 %  Max: 97 %      Constitutional: Seems to be NAD   HEENT: NC/AT, PERRL,EOMI. Neck supple  Cardiovascular: RRytm, normal S1 S2, no murmurs, gallops.  Respiratory:  CTA bilaterally Normal rate. No retractions or increased work of breathing.  Gastrointestinal: Normal BS, non-distended, soft, non-tender, no rebound or guarding.  Genitourinary: no suprapubic or costovertebral angle tenderness  Musculoskeletal: ROM and motor strength grossly normal. No clubbing, edema.   Skin: no rashes, jaundice , cyanosis  Neurologic: AAO x3.  Able to move lower extremities but painful back   Psychiatric: Flat affect . Cooperative.     LABS     Recent Labs   Lab 06/30/21  0448 06/29/21  0527 06/28/21  0310   WBC 9.8 10.0 14.1*   RBC 4.39 4.28 4.80   Hemoglobin 10.9* 10.3* 11.4*   Hematocrit 35.6* 35.1* 39.2   MCV 81 82 82   PLT CT 444* 471* 543*         Recent Labs   Lab 06/30/21  0448 06/29/21  0527 06/28/21  0310   Sodium 140 140 140   Potassium 4.3 4.3 4.4   Chloride 105 102 99   CO2 26 26 31*   BUN 13 18 23*   Creatinine 0.67* 0.71* 0.91   Glucose 73 118* 90   Calcium 9.6 9.7 10.2   Magnesium  --  1.9 1.8         Recent Labs   Lab 06/30/21  0448 06/29/21  0527 06/28/21  0310   ALT 11 13 16    AST (SGOT)  11 13 18    Bilirubin, Total 0.3 0.3 0.3   Albumin 2.6* 2.8* 3.3*   Alkaline Phosphatase 36* 37* 43               Recent Labs   Lab 06/28/21  0804   PT INR 1.2*   PT 12.5*         Microbiology Results (last 15 days)       Procedure Component Value Units Date/Time    Lab Specimen Yes [[id number]]     Order Status: Sent     Abscess Culture and Smear [[id number]]     Order Status: Sent Specimen: Abscess from Spine      Sterile Body Fluid Culture and Smear [[id number]]     Order Status: Sent Specimen: Other from Seroma     Blood Culture - Venipuncture [[id number]] Collected: 06/28/21 0717    Order Status: Completed Specimen: Blood from Venipuncture Updated: 06/29/21 2044     Culture Result No Growth To Date    Blood Culture - Venipuncture [[id number]] Collected: 06/28/21 0450    Order Status: Completed Specimen: Blood from Venipuncture Updated: 06/29/21 2044     Culture Result --     Blood culture volume for this collection was less than the optimal needed to acheive adequate sensitivity.  No Growth To Date               RADIOLOGY     No results found.    HH needs if any:  There are no questions and answers to display.       Nutrition assessment (if any) in collaboration with medical nutritionist :            Signed,  Clabe Seal, MD  7:44 AM 06/30/2021

## 2021-06-30 NOTE — Plan of Care (Signed)
NURSE NOTE SUMMARY  [facility] [address]   Patient Name: Clayton Davis   Attending Physician: Clabe Seal, MD   Today's date:   06/30/2021 LOS: 2 days   Shift Summary:                                                              1900:Took over patient care  observed patient in bed  positioned supine.  Appeared awake A/O x 4, patient reports back pain that radiates  to Pelvis  Repositioned for comfort. IV to Rt. Forearm, NACL infusing at 100 ML   Continuous. 2000: IV flushed patent capped.  0005: RN responded to patient call, reported severe spasm in back  Observed positioned on abdomen, reported unable to move, prn  Flexeril given. 0100, patient reported  flexeril effective, assisted patient to   Repositioned for comfort. 0200: prn oxycodone given for pain.  Will continue to monitor.   Provider Notifications:        Rapid Response Notifications:  Mobility:      PMP Activity: Step 6 - Walks in Room (06/29/2021  8:15 AM)     Weight tracking:  Family Dynamic:   Last 3 Weights for the past 72 hrs (Last 3 readings):   Weight   06/28/21 0221 86.2 kg (190 lb)             Last Bowel Movement   No data recorded        Problem: Compromised Hemodynamic Status  Goal: Vital signs and fluid balance maintained/improved  Outcome: Progressing  Flowsheets (Taken 06/30/2021 0213)  Vital signs and fluid balance are maintained/improved:   Position patient for maximum circulation/cardiac output   Monitor/assess vitals and hemodynamic parameters with position changes   Monitor/assess lab values and report abnormal values     Problem: Impaired Mobility  Goal: Mobility/Activity is maintained at optimal level for patient  Outcome: Progressing  Flowsheets (Taken 06/30/2021 0213)  Mobility/activity is maintained at optimal level for patient:   Increase mobility as tolerated/progressive mobility   Maintain proper body alignment   Plan activities to conserve energy, plan rest periods   Reposition patient every 2  hours and as needed unless able to reposition self   Assess for changes in respiratory status, level of consciousness and/or development of fatigue     Problem: Peripheral Neurovascular Impairment  Goal: Extremity color, movement, sensation are maintained or improved  Outcome: Progressing  Flowsheets (Taken 06/30/2021 0213)  Extremity color, movement, sensation are maintained or improved:   Increase mobility as tolerated/progressive mobility   Assess extremity for proper alignment   Teach/review/reinforce ankle pump exercises     Problem: Compromised skin integrity  Goal: Skin integrity is maintained or improved  Outcome: Progressing  Flowsheets (Taken 06/30/2021 0213)  Skin integrity is maintained or improved:   Assess Braden Scale every shift   Turn or reposition patient every 2 hours or as needed unless able to reposition self   Avoid shearing   Keep skin clean and dry   Monitor patient's hygiene practices   Keep head of bed 30 degrees or less (unless contraindicated)     Problem: Pain interferes with ability to perform ADL  Goal: Pain at adequate level as identified by patient  Outcome: Progressing  Flowsheets (  Taken 06/30/2021 0213)  Pain at adequate level as identified by patient:   Assess pain on admission, during daily assessment and/or before any "as needed" intervention(s)   Reassess pain within 30-60 minutes of any procedure/intervention, per Pain Assessment, Intervention, Reassessment (AIR) Cycle   Evaluate if patient comfort function goal is met   Evaluate patient's satisfaction with pain management progress   Offer non-pharmacological pain management interventions     Problem: Side Effects from Pain Analgesia  Goal: Patient will experience minimal side effects of analgesic therapy  Outcome: Progressing  Flowsheets (Taken 06/30/2021 0213)  Patient will experience minimal side effects of analgesic therapy:   Assess for changes in cognitive function   Prevent/manage side effects per LIP orders (i.e. nausea,  vomiting, pruritus, constipation, urinary retention, etc.)   Evaluate for opioid-induced sedation with appropriate assessment tool (i.e. POSS)     Problem: Moderate/High Fall Risk Score >5  Goal: Patient will remain free of falls  Outcome: Progressing  Flowsheets (Taken 06/30/2021 0213)  Moderate Risk (6-13):   LOW-Fall Interventions Appropriate for Low Fall Risk   MOD-Floor mat at bedside (where available) if appropriate   MOD-Remain with patient during toileting   MOD-Place bedside commode and assistive devices out of sight when not in use   MOD-Apply bed exit alarm if patient is confused   MOD-Request PT/OT consult order for patients with gait/mobility impairment

## 2021-06-30 NOTE — Plan of Care (Addendum)
NURSE NOTE SUMMARY  [facility] [address]   Patient Name: Clayton Davis   Attending Physician: Clabe Seal, MD   Today's date:   06/30/2021 LOS: 2 days   Shift Summary:                                                              0700- Received report, took over pt care, pt resting in bed, call bell in reach    Pt resting in bed, able to turn self.  Pain was controlled with oral medications.  IV intact, infusing NS @ 100. Pt not very good at keeping arm straight as IV is in [facility], fluids stopped and started throughout the day.  Pt is voiding WNL in the urinal.  Pt very painful this morning, limited on what pain meds he can have d/t q-t prolongation on previous EKG.  EKG from today still has prolonged q-t segment and now has borderline ST elevation in lateral leads. No new orders, pt currently refusing tele  Pt NPO midnight for IR tomorrow.  Pt has had poor appetite through the day, only had light snacks, very nervous for procedure tomorrow.  Neuro-vascular assessment is intact.  Vital  signs reviewed:    Temp: 98.8 F (37.1 C), Heart Rate: 65, Resp Rate: 16, BP: 109/67    Pt tolerating po well, call bell in reach, pt voices no concerns at this time           Provider Notifications:        Rapid Response Notifications:  Mobility:      PMP Activity: Step 6 - Walks in Room (06/30/2021  8:15 AM)     Weight tracking:  Family Dynamic:   Last 3 Weights for the past 72 hrs (Last 3 readings):   Weight   06/28/21 0221 86.2 kg (190 lb)             Last Bowel Movement   No data recorded        Problem: Compromised Hemodynamic Status  Goal: Vital signs and fluid balance maintained/improved  Outcome: Progressing  Flowsheets (Taken 06/30/2021 0213 by Ferdie Ping, RN)  Vital signs and fluid balance are maintained/improved:   Position patient for maximum circulation/cardiac output   Monitor/assess vitals and hemodynamic parameters with position changes   Monitor/assess lab values and report abnormal  values     Problem: Impaired Mobility  Goal: Mobility/Activity is maintained at optimal level for patient  Outcome: Progressing  Flowsheets (Taken 06/30/2021 0213 by Ferdie Ping, RN)  Mobility/activity is maintained at optimal level for patient:   Increase mobility as tolerated/progressive mobility   Maintain proper body alignment   Plan activities to conserve energy, plan rest periods   Reposition patient every 2 hours and as needed unless able to reposition self   Assess for changes in respiratory status, level of consciousness and/or development of fatigue     Problem: Peripheral Neurovascular Impairment  Goal: Extremity color, movement, sensation are maintained or improved  Outcome: Progressing  Flowsheets (Taken 06/30/2021 0213 by Ferdie Ping, RN)  Extremity color, movement, sensation are maintained or improved:   Increase mobility as tolerated/progressive mobility   Assess extremity for proper alignment   Teach/review/reinforce ankle pump exercises  Problem: Compromised skin integrity  Goal: Skin integrity is maintained or improved  Outcome: Progressing  Flowsheets (Taken 06/30/2021 0213 by Ferdie Ping, RN)  Skin integrity is maintained or improved:   Assess Braden Scale every shift   Turn or reposition patient every 2 hours or as needed unless able to reposition self   Avoid shearing   Keep skin clean and dry   Monitor patient's hygiene practices   Keep head of bed 30 degrees or less (unless contraindicated)     Problem: Pain interferes with ability to perform ADL  Goal: Pain at adequate level as identified by patient  Outcome: Progressing  Flowsheets (Taken 06/30/2021 0213 by Ferdie Ping, RN)  Pain at adequate level as identified by patient:   Assess pain on admission, during daily assessment and/or before any "as needed" intervention(s)   Reassess pain within 30-60 minutes of any procedure/intervention, per Pain Assessment, Intervention, Reassessment (AIR) Cycle   Evaluate if patient comfort  function goal is met   Evaluate patient's satisfaction with pain management progress   Offer non-pharmacological pain management interventions     Problem: Side Effects from Pain Analgesia  Goal: Patient will experience minimal side effects of analgesic therapy  Outcome: Progressing  Flowsheets (Taken 06/30/2021 0213 by Ferdie Ping, RN)  Patient will experience minimal side effects of analgesic therapy:   Assess for changes in cognitive function   Prevent/manage side effects per LIP orders (i.e. nausea, vomiting, pruritus, constipation, urinary retention, etc.)   Evaluate for opioid-induced sedation with appropriate assessment tool (i.e. POSS)     Problem: Moderate/High Fall Risk Score >5  Goal: Patient will remain free of falls  Outcome: Progressing  Flowsheets (Taken 06/30/2021 0815)  VH Moderate Risk (6-13):   ALL REQUIRED LOW INTERVENTIONS   INITIATE YELLOW "FALL RISK" SIGNAGE   YELLOW NON-SKID SLIPPERS   YELLOW "FALL RISK" ARM BAND   USE OF BED EXIT ALARM IF PATIENT IS CONFUSED OR IMPULSIVE. PLACE RESET BED ALARM SIGN ABOVE BED   PLACE FALL RISK LEVEL ON WHITE BOARD FOR COMMUNICATION PURPOSES IN PATIENT'S ROOM   Remain with patient during toileting   Use assistive devices   Use chair-pad alarm device   Use of "STOP ask for help" sign

## 2021-07-01 ENCOUNTER — Inpatient Hospital Stay: Payer: PRIVATE HEALTH INSURANCE

## 2021-07-01 LAB — ECG 12-LEAD
P Wave Axis: 52 deg
P Wave Axis: 8 deg
P Wave Axis: 49 deg
P-R Interval: 135 ms
P-R Interval: 139 ms
P-R Interval: 140 ms
Patient Age: 29 years
Patient Age: 29 years
Patient Age: 29 years
Q-T Interval(Corrected): 558 ms
Q-T Interval(Corrected): 524 ms
Q-T Interval(Corrected): 502 ms
Q-T Interval: 577 ms
Q-T Interval: 580 ms
Q-T Interval: 461 ms
QRS Axis: 39 deg
QRS Axis: 48 deg
QRS Axis: 46 deg
QRS Duration: 95 ms
QRS Duration: 89 ms
QRS Duration: 95 ms
T Axis: 22 years
T Axis: 31 years
T Axis: 33 years
Ventricular Rate: 56 //min
Ventricular Rate: 49 //min
Ventricular Rate: 71 //min

## 2021-07-01 LAB — CBC AND DIFFERENTIAL
Basophils %: 0.2 % (ref 0.0–3.0)
Basophils Absolute: 0 10*3/uL (ref 0.0–0.3)
Eosinophils %: 2.2 % (ref 0.0–7.0)
Eosinophils Absolute: 0.3 10*3/uL (ref 0.0–0.8)
Hematocrit: 34.8 % — ABNORMAL LOW (ref 39.0–52.5)
Hemoglobin: 10.7 gm/dL — ABNORMAL LOW (ref 13.0–17.5)
Lymphocytes Absolute: 2.5 10*3/uL (ref 0.6–5.1)
Lymphocytes: 21.6 % (ref 15.0–46.0)
MCH: 25 pg — ABNORMAL LOW (ref 28–35)
MCHC: 31 gm/dL — ABNORMAL LOW (ref 32–36)
MCV: 81 fL (ref 80–100)
MPV: 7.6 fL (ref 6.0–10.0)
Monocytes Absolute: 1.1 10*3/uL (ref 0.1–1.7)
Monocytes: 9.6 % (ref 3.0–15.0)
Neutrophils %: 66.4 % (ref 42.0–78.0)
Neutrophils Absolute: 7.7 10*3/uL (ref 1.7–8.6)
PLT CT: 455 10*3/uL — ABNORMAL HIGH (ref 130–440)
RBC: 4.31 10*6/uL (ref 4.00–5.70)
RDW: 12.1 % (ref 11.0–14.0)
WBC: 11.6 10*3/uL — ABNORMAL HIGH (ref 4.0–11.0)

## 2021-07-01 LAB — COMPREHENSIVE METABOLIC PANEL
ALT: 9 U/L (ref 0–55)
AST (SGOT): 12 U/L (ref 10–42)
Albumin/Globulin Ratio: 0.61 Ratio — ABNORMAL LOW (ref 0.80–2.00)
Albumin: 2.8 gm/dL — ABNORMAL LOW (ref 3.5–5.0)
Alkaline Phosphatase: 37 U/L — ABNORMAL LOW (ref 40–145)
Anion Gap: 15.1 mMol/L (ref 7.0–18.0)
BUN / Creatinine Ratio: 19.2 Ratio (ref 10.0–30.0)
BUN: 14 mg/dL (ref 7–22)
Bilirubin, Total: 0.4 mg/dL (ref 0.1–1.2)
CO2: 26 mMol/L (ref 20–30)
Calcium: 9.6 mg/dL (ref 8.5–10.5)
Chloride: 100 mMol/L (ref 98–110)
Creatinine: 0.73 mg/dL — ABNORMAL LOW (ref 0.80–1.30)
EGFR: 126 mL/min/{1.73_m2} (ref 60–150)
Globulin: 4.6 gm/dL — ABNORMAL HIGH (ref 2.0–4.0)
Glucose: 90 mg/dL (ref 71–99)
Osmolality Calculated: 274 mOsm/kg — ABNORMAL LOW (ref 275–300)
Potassium: 4.1 mMol/L (ref 3.5–5.3)
Protein, Total: 7.4 gm/dL (ref 6.0–8.3)
Sodium: 137 mMol/L (ref 136–147)

## 2021-07-01 LAB — HEPATITIS PANEL, ACUTE
Hepatitis A IgM: NONREACTIVE
Hepatitis B Core IgM: NONREACTIVE
Hepatitis B Surface Antigen: NONREACTIVE
Hepatitis C Antibody: REACTIVE — AB

## 2021-07-01 LAB — VH LAB SPECIMEN YES

## 2021-07-01 LAB — HIV AG/AB 4TH GENERATION: HIV Ag/Ab, 4th Generation: NONREACTIVE

## 2021-07-01 MED ORDER — SODIUM CHLORIDE 0.9 % IV MBP
2.0000 g | Freq: Two times a day (BID) | INTRAVENOUS | Status: DC
Start: 2021-07-01 — End: 2021-07-04
  Administered 2021-07-01 – 2021-07-04 (×5): 2 g via INTRAVENOUS
  Filled 2021-07-01 (×6): qty 2

## 2021-07-01 MED ORDER — VH VANCOMYCIN THERAPY PLACEHOLDER
Status: DC
Start: 2021-07-01 — End: 2021-07-03

## 2021-07-01 MED ORDER — METHADONE HCL 5 MG PO TABS
35.0000 mg | ORAL_TABLET | Freq: Three times a day (TID) | ORAL | Status: DC
Start: 2021-07-01 — End: 2021-07-09
  Administered 2021-07-01 – 2021-07-09 (×25): 35 mg via ORAL
  Filled 2021-07-01 (×25): qty 7

## 2021-07-01 MED ORDER — METAXALONE 800 MG PO TABS
400.0000 mg | ORAL_TABLET | Freq: Three times a day (TID) | ORAL | Status: DC
Start: 2021-07-01 — End: 2021-07-02
  Administered 2021-07-01 – 2021-07-02 (×4): 400 mg via ORAL
  Filled 2021-07-01 (×6): qty 1

## 2021-07-01 MED ORDER — PREGABALIN 25 MG PO CAPS
25.0000 mg | ORAL_CAPSULE | Freq: Three times a day (TID) | ORAL | Status: DC
Start: 2021-07-01 — End: 2021-07-03
  Administered 2021-07-01 – 2021-07-03 (×6): 25 mg via ORAL
  Filled 2021-07-01 (×6): qty 1

## 2021-07-01 MED ORDER — VANCOMYCIN HCL 1.5 G IV SOLR
1500.0000 mg | Freq: Once | INTRAVENOUS | Status: AC
Start: 2021-07-01 — End: 2021-07-01
  Administered 2021-07-01: 21:00:00 1500 mg via INTRAVENOUS
  Filled 2021-07-01: qty 1500

## 2021-07-01 MED ORDER — MIDAZOLAM HCL 1 MG/ML IJ SOLN (WRAP)
INTRAMUSCULAR | Status: AC | PRN
Start: 2021-07-01 — End: 2021-07-01
  Administered 2021-07-01: 1 mg via INTRAVENOUS

## 2021-07-01 MED ORDER — VH FENTANYL CITRATE 100 MCG/2 ML (NARRATOR)
INTRAMUSCULAR | Status: AC | PRN
Start: 2021-07-01 — End: 2021-07-01
  Administered 2021-07-01: 50 ug via INTRAVENOUS

## 2021-07-01 MED ORDER — MIDAZOLAM HCL 1 MG/ML IJ SOLN (WRAP)
INTRAMUSCULAR | Status: AC
Start: 2021-07-01 — End: ?
  Filled 2021-07-01: qty 2

## 2021-07-01 MED ORDER — LIDOCAINE HCL (PF) 2 % IJ SOLN
INTRAMUSCULAR | Status: AC
Start: 2021-07-01 — End: ?
  Filled 2021-07-01: qty 10

## 2021-07-01 MED ORDER — MIDAZOLAM HCL 1 MG/ML IJ SOLN (WRAP)
INTRAMUSCULAR | Status: AC | PRN
Start: 2021-07-01 — End: 2021-07-01
  Administered 2021-07-01: 0.5 mg via INTRAVENOUS

## 2021-07-01 MED ORDER — CLONIDINE HCL 0.1 MG PO TABS
0.1000 mg | ORAL_TABLET | Freq: Three times a day (TID) | ORAL | Status: DC | PRN
Start: 2021-07-01 — End: 2021-07-09

## 2021-07-01 MED ORDER — VANCOMYCIN HCL IN DEXTROSE 1-5 GM/200ML-% IV SOLN
1000.0000 mg | Freq: Two times a day (BID) | INTRAVENOUS | Status: DC
Start: 2021-07-01 — End: 2021-07-01

## 2021-07-01 MED ORDER — VANCOMYCIN HCL 1.5 G IV SOLR
1500.0000 mg | Freq: Two times a day (BID) | INTRAVENOUS | Status: DC
Start: 2021-07-02 — End: 2021-07-03
  Administered 2021-07-02 (×2): 1500 mg via INTRAVENOUS
  Filled 2021-07-01 (×3): qty 1500

## 2021-07-01 MED ORDER — VH FENTANYL CITRATE 100 MCG/2 ML (NARRATOR)
INTRAMUSCULAR | Status: AC | PRN
Start: 2021-07-01 — End: 2021-07-01
  Administered 2021-07-01: 25 ug via INTRAVENOUS

## 2021-07-01 MED ORDER — KETOROLAC TROMETHAMINE 30 MG/ML IJ SOLN
30.0000 mg | Freq: Four times a day (QID) | INTRAMUSCULAR | Status: AC
Start: 2021-07-01 — End: 2021-07-02
  Administered 2021-07-01 – 2021-07-02 (×6): 30 mg via INTRAVENOUS
  Filled 2021-07-01 (×6): qty 1

## 2021-07-01 MED ORDER — FENTANYL CITRATE (PF) 50 MCG/ML IJ SOLN (WRAP)
INTRAMUSCULAR | Status: AC
Start: 2021-07-01 — End: ?
  Filled 2021-07-01: qty 2

## 2021-07-01 NOTE — Plan of Care (Addendum)
NURSE NOTE SUMMARY  [facility] - [facility] [address]   Patient Name: Clayton Davis   Attending Physician: Clabe Seal, MD   Today's date:   07/01/2021 LOS: 3 days   Shift Summary:                                                              1610:  VSS, neurovascular status intact.  Pt. Is NPO at this time for CT guided aspiration today.  Voiding to urinal adequately.  Agreed for telemetry to be placed per orders.  Continues IVF's.  Falls precautions in place. 1345:  To CT for procedure via bed with transporter staff accompanying.  1630:  Pt. Returned to room via bed s/p CT guided aspiration.  Unable to directly visualize dressing to back due to pts. Pain level at this time.  Area is dry.  When palpated.  Neurovascular status remains intact.  Diet restarted.  Analgesia given per scheduled and prn orders.  1830:  Pt. Resting iquietly in bed listening to music, tolerating diet.  End of shift.      Provider Notifications:        Rapid Response Notifications:  Mobility:      PMP Activity: Step 6 - Walks in Room; Step 3 - Bed Mobility (07/01/2021  8:29 AM)     Weight tracking:  Family Dynamic:   No data found.          Last Bowel Movement   No data recorded        Problem: Compromised Hemodynamic Status  Goal: Vital signs and fluid balance maintained/improved  Outcome: Progressing  Flowsheets (Taken 06/30/2021 0213 by Ferdie Ping, RN)  Vital signs and fluid balance are maintained/improved:   Position patient for maximum circulation/cardiac output   Monitor/assess vitals and hemodynamic parameters with position changes   Monitor/assess lab values and report abnormal values     Problem: Impaired Mobility  Goal: Mobility/Activity is maintained at optimal level for patient  Outcome: Progressing  Flowsheets (Taken 06/30/2021 0213 by Ferdie Ping, RN)  Mobility/activity is maintained at optimal level for patient:   Increase mobility as tolerated/progressive mobility   Maintain proper body alignment   Plan  activities to conserve energy, plan rest periods   Reposition patient every 2 hours and as needed unless able to reposition self   Assess for changes in respiratory status, level of consciousness and/or development of fatigue     Problem: Peripheral Neurovascular Impairment  Goal: Extremity color, movement, sensation are maintained or improved  Outcome: Progressing  Flowsheets (Taken 06/30/2021 0213 by Ferdie Ping, RN)  Extremity color, movement, sensation are maintained or improved:   Increase mobility as tolerated/progressive mobility   Assess extremity for proper alignment   Teach/review/reinforce ankle pump exercises     Problem: Compromised skin integrity  Goal: Skin integrity is maintained or improved  Outcome: Progressing  Flowsheets (Taken 06/30/2021 0213 by Ferdie Ping, RN)  Skin integrity is maintained or improved:   Assess Braden Scale every shift   Turn or reposition patient every 2 hours or as needed unless able to reposition self   Avoid shearing   Keep skin clean and dry   Monitor patient's hygiene practices   Keep head of bed 30 degrees or less (  unless contraindicated)     Problem: Pain interferes with ability to perform ADL  Goal: Pain at adequate level as identified by patient  Outcome: Progressing  Flowsheets (Taken 06/30/2021 0213 by Ferdie Ping, RN)  Pain at adequate level as identified by patient:   Assess pain on admission, during daily assessment and/or before any "as needed" intervention(s)   Reassess pain within 30-60 minutes of any procedure/intervention, per Pain Assessment, Intervention, Reassessment (AIR) Cycle   Evaluate if patient comfort function goal is met   Evaluate patient's satisfaction with pain management progress   Offer non-pharmacological pain management interventions     Problem: Side Effects from Pain Analgesia  Goal: Patient will experience minimal side effects of analgesic therapy  Outcome: Progressing  Flowsheets (Taken 06/30/2021 0213 by Ferdie Ping,  RN)  Patient will experience minimal side effects of analgesic therapy:   Assess for changes in cognitive function   Prevent/manage side effects per LIP orders (i.e. nausea, vomiting, pruritus, constipation, urinary retention, etc.)   Evaluate for opioid-induced sedation with appropriate assessment tool (i.e. POSS)     Problem: Moderate/High Fall Risk Score >5  Goal: Patient will remain free of falls  Outcome: Progressing  Flowsheets (Taken 06/30/2021 0213 by Ferdie Ping, RN)  Moderate Risk (6-13):   LOW-Fall Interventions Appropriate for Low Fall Risk   MOD-Floor mat at bedside (where available) if appropriate   MOD-Remain with patient during toileting   MOD-Place bedside commode and assistive devices out of sight when not in use   MOD-Apply bed exit alarm if patient is confused   MOD-Request PT/OT consult order for patients with gait/mobility impairment

## 2021-07-01 NOTE — Progress Notes (Signed)
[facility]  PROGRESS NOTE      Patient: Clayton Davis  Date: 07/01/2021   LOS: 3 Days  Admission Date: 06/28/2021   MRN: [id number]  Attending: Clabe Seal, MD       Please contact me via Epic chat for routine matters and via phone [phone] for urgent matters.    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  I therefore IR procedure aborted.  I was notified by IR PA they are scheduling it for Monday.       ID physician Dr. Luisa Hart communicated with me.  She advised not to use antibiotic as patient is stable.  She is okay for antibiotic to resume over the weekend if patient keeps getting sick and febrile.  The idea behind this is so that they specimen from the aspiration would be high yield to give [place] a more targeted antibiotic therapy.      10/01- wbc normalized since yest . add lidocaine patch . Otherwise plan is the same . Qtc 558  10/2 for IR bx tomorrow.  Hold Advil after noon dose      -- for IR aspiration today        SUBJECTIVE     Clayton Davis afebrile   Complains of back pain  Wife is in the room and supportive         NAD otherwise       ASSESSMENT/PLAN     Clayton Davis is a 30 y.o. 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  -X-ray lumbar spine from 05/18/2021 shows possible transverse process fracture  -X-ray lumbar spine from 05/17/2021 shows possible L3 transverse process fracture  -CT abdomen pelvis from 05/26/2021 shows broad-based disc bulge on L4/L5 causing moderate left neuroforaminal narrowing    -Follow-up blood cultures    9/30-  Apparently before noon patient managed to eat brown and Frappuccino even though he was made n.p.o..  I therefore IR procedure aborted.  I was notified by IR PA they are scheduling it for Monday.       ID physician Dr. Luisa Hart communicated with me.  She advised not to use antibiotic as patient is stable.  She is okay for antibiotic to resume over the weekend if patient keeps getting sick and febrile.  The idea behind this is so that they specimen from the aspiration would be high yield to give [place] a more targeted antibiotic therapy.      10/01- wbc normalized since yest . add lidocaine patch . Otherwise plan is the same . Qtc 558  10/2 for IR bx tomorrow.  Hold Advil after noon dose      10/3 for interventional radiology aspiration today.  I spoke to wife in the room and I also explained to her regards to limitations for pain management in regards to QTC prolongation and potential risks    Opioid dependence  -Patient states he is on 145 methadone at home  -consulted pain mx   - Methadone 40 Mg tid here  OTC prolangation ( not brand new but needs monitoring )   To mitigate risks--Methadone 40 tid ( dose reduced and separated ) , decrease prozac dose by half, daily ekg , tele, monitor lytes   QTc 558--> 524--> pending     Anemia  -Hemoglobin 11/39 on admission   -Previous hemoglobin 13/42  -Monitor for signs of bleed  -Iron studies -- suggests sequestration   Prophylactic pepcid  Lab Results   Component Value Date    HGB 10.7 (L) 07/01/2021         Hyperlipidemia  -Continue statin    Mood disorder/anxiety  -Continue Wellbutrin, buspirone, fluoxetine    Constipation   Bowel regimen       Dispo:   Continue current care   IR today   Resume abx post IR       DVT Prophylaxis: lovenox on hold bx procedure        Code Status: Full Code      MEDICATIONS     Current Facility-Administered Medications   Medication Dose Route Frequency    acetaminophen  650 mg Oral Q6H    atorvastatin  40 mg Oral Daily    buPROPion SR  150 mg  Oral Daily    busPIRone  15 mg Oral Q12H SCH    docusate sodium  200 mg Oral BID    famotidine  20 mg Oral BID AC    FLUoxetine  10 mg Oral QAM    ketorolac  30 mg Intravenous 4 times per day    lactobacillus species  50 Billion CFU Oral Daily    lidocaine  1 patch Transdermal Q24H    metaxalone  400 mg Oral TID    methadone  35 mg Oral Q8H SCH    pregabalin  25 mg Oral TID    sodium chloride (PF)  3 mL Intravenous Q12H SCH       PHYSICAL EXAM     Vitals:    07/01/21 0703   BP: 119/70   Pulse: (!) 57   Resp: 16   Temp: 98.8 F (37.1 C)   SpO2: 96%       Temperature: Temp  Min: 97.2 F (36.2 C)  Max: 98.8 F (37.1 C)  Pulse: Pulse  Min: 57  Max: 60  Respiratory: Resp  Min: 16  Max: 16  Non-Invasive BP: BP  Min: 113/74  Max: 122/57  Pulse Oximetry SpO2  Min: 96 %  Max: 97 %      Constitutional: Seems to be NAD if doesn't move   HEENT: NC/AT, PERRL,EOMI. Neck supple  Cardiovascular: RRytm, normal S1 S2, no murmurs, gallops.  Respiratory:  CTA bilaterally Normal rate. No retractions or increased work of breathing.  Gastrointestinal: Normal BS, non-distended, soft, non-tender, no rebound or guarding.  Genitourinary: no suprapubic or costovertebral angle tenderness  Musculoskeletal: Lower extremity movement limited by pain   skin: no rashes, jaundice , cyanosis  Neurologic: AAO x3.  Able to move lower extremities but painful back   Psychiatric: Flat affect . Cooperative.     LABS     Recent Labs   Lab 07/01/21  0403 06/30/21  0448 06/29/21  0527   WBC 11.6* 9.8 10.0   RBC 4.31 4.39 4.28   Hemoglobin 10.7* 10.9* 10.3*   Hematocrit 34.8* 35.6* 35.1*   MCV 81 81 82   PLT CT 455* 444* 471*         Recent Labs   Lab 07/01/21  0403 06/30/21  0448 06/29/21  0527 06/28/21  0310   Sodium 137 140 140 140   Potassium 4.1 4.3 4.3 4.4   Chloride 100 105 102 99   CO2 26 26 26  31*   BUN 14 13 18  23*   Creatinine 0.73* 0.67* 0.71* 0.91   Glucose 90 73 118* 90   Calcium 9.6 9.6 9.7 10.2   Magnesium  --   --  1.9 1.8         Recent Labs    Lab 07/01/21  0403 06/30/21  0448 06/29/21  0527   ALT 9 11 13    AST (SGOT) 12 11 13    Bilirubin, Total 0.4 0.3 0.3   Albumin 2.8* 2.6* 2.8*   Alkaline Phosphatase 37* 36* 37*               Recent Labs   Lab 06/28/21  0804   PT INR 1.2*   PT 12.5*         Microbiology Results (last 15 days)       Procedure Component Value Units Date/Time    Abscess Culture and Smear [[id number]]     Order Status: Sent Specimen: Abscess from Spine     Lab Specimen Yes [[id number]]     Order Status: Sent     Sterile Body Fluid Culture and Smear [[id number]]     Order Status: Sent Specimen: Other from Seroma     AFB (Mycobacterium) Culture and Smear [[id number]]     Order Status: Sent Specimen: Abscess from Seroma     Multi-Specimen Fungal Culture and Smear [[id number]]     Order Status: Sent Specimen: Abscess from Spine     Lab Specimen Yes [[id number]]     Order Status: Sent     Lab Specimen Yes [[id number]]     Order Status: Canceled     Abscess Culture and Smear [[id number]]     Order Status: Canceled Specimen: Abscess from Spine     Sterile Body Fluid Culture and Smear [[id number]]     Order Status: Canceled Specimen: Other from Seroma     Blood Culture - Venipuncture [[id number]] Collected: 06/28/21 0717    Order Status: Completed Specimen: Blood from Venipuncture Updated: 06/29/21 2044     Culture Result No Growth To Date    Blood Culture - Venipuncture [[id number]] Collected: 06/28/21 0450    Order Status: Completed Specimen: Blood from Venipuncture Updated: 06/29/21 2044     Culture Result --     Blood culture volume for this collection was less than the optimal needed to acheive adequate sensitivity.  No Growth To Date               RADIOLOGY     No results found.    HH needs if any:  There are no questions and answers to display.       Nutrition assessment (if any) in collaboration with medical nutritionist :            Signed,  Clabe Seal, MD  11:50 AM 07/01/2021

## 2021-07-01 NOTE — Sedation Documentation (Signed)
Debriefing complete- specimens labeled properly and tech will deliver to lab, team discussed ordered labs.There are no equipment concerns.  No critical events or postoperative concerns. No discrepancies identified.  Patient plan of care made, will return to floor. Report called to primary nurse.     Total Sedation time:  10 minutes  Total Medications for case:         1.5 mg  Versed    75 mcg Fentanyl

## 2021-07-01 NOTE — Plan of Care (Signed)
NURSE NOTE SUMMARY  [facility] - [facility] [address]   Patient Name: Clayton Davis   Attending Physician: Clabe Seal, MD   Today's date:   07/01/2021 LOS: 3 days   Shift Summary:                                                              1900:Took over patient care, observed patient in bed, drowsy, but easily awake.  Patient reporting low back pain 7/10. IV stopped, reading occlusion alarm.  Patient having hard time with keep limb extended during sleep.  Flushed IV patent,restarted NACL at 100 ML an hour.  2030:Family member at bedside, assisting patient with feeding, discussed  NPO status at midnight, patient agreeable.  2114: oxycodone given prn for pain. 2326, IV fluid restarted for continuous occlusion.2338: vitals  check patient HR 59, appeared alert, denied chest pain, and dizziness. 0000: removed fluid from patient reach, call light within reach, bed alarm in place, family member at bedside.    0115:RN attention call to patient C/O of spasm and been unable to   Move, feel like he not been medicated enough. Patient educated that  Prn meds are not schedule, that why RN is not giving. Patient  Educated on prn orders, has capacity and able to understand.     Provider Notifications:        Rapid Response Notifications:  Mobility:      PMP Activity: Step 6 - Walks in Room (06/30/2021  8:15 AM)     Weight tracking:  Family Dynamic:   Last 3 Weights for the past 72 hrs (Last 3 readings):   Weight   06/28/21 0221 86.2 kg (190 lb)             Last Bowel Movement   No data recorded        Problem: Compromised Hemodynamic Status  Goal: Vital signs and fluid balance maintained/improved  Outcome: Progressing  Flowsheets (Taken 06/30/2021 0213)  Vital signs and fluid balance are maintained/improved:   Position patient for maximum circulation/cardiac output   Monitor/assess vitals and hemodynamic parameters with position changes   Monitor/assess lab values and report abnormal values     Problem: Impaired  Mobility  Goal: Mobility/Activity is maintained at optimal level for patient  Outcome: Progressing  Flowsheets (Taken 06/30/2021 0213)  Mobility/activity is maintained at optimal level for patient:   Increase mobility as tolerated/progressive mobility   Maintain proper body alignment   Plan activities to conserve energy, plan rest periods   Reposition patient every 2 hours and as needed unless able to reposition self   Assess for changes in respiratory status, level of consciousness and/or development of fatigue     Problem: Peripheral Neurovascular Impairment  Goal: Extremity color, movement, sensation are maintained or improved  Outcome: Progressing  Flowsheets (Taken 06/30/2021 0213)  Extremity color, movement, sensation are maintained or improved:   Increase mobility as tolerated/progressive mobility   Assess extremity for proper alignment   Teach/review/reinforce ankle pump exercises     Problem: Compromised skin integrity  Goal: Skin integrity is maintained or improved  Outcome: Progressing  Flowsheets (Taken 06/30/2021 0213)  Skin integrity is maintained or improved:   Assess Braden Scale every shift   Turn or reposition patient every 2 hours or  as needed unless able to reposition self   Avoid shearing   Keep skin clean and dry   Monitor patient's hygiene practices   Keep head of bed 30 degrees or less (unless contraindicated)     Problem: Pain interferes with ability to perform ADL  Goal: Pain at adequate level as identified by patient  Outcome: Progressing  Flowsheets (Taken 06/30/2021 0213)  Pain at adequate level as identified by patient:   Assess pain on admission, during daily assessment and/or before any "as needed" intervention(s)   Reassess pain within 30-60 minutes of any procedure/intervention, per Pain Assessment, Intervention, Reassessment (AIR) Cycle   Evaluate if patient comfort function goal is met   Evaluate patient's satisfaction with pain management progress   Offer non-pharmacological pain  management interventions     Problem: Side Effects from Pain Analgesia  Goal: Patient will experience minimal side effects of analgesic therapy  Outcome: Progressing  Flowsheets (Taken 06/30/2021 0213)  Patient will experience minimal side effects of analgesic therapy:   Assess for changes in cognitive function   Prevent/manage side effects per LIP orders (i.e. nausea, vomiting, pruritus, constipation, urinary retention, etc.)   Evaluate for opioid-induced sedation with appropriate assessment tool (i.e. POSS)     Problem: Moderate/High Fall Risk Score >5  Goal: Patient will remain free of falls  Outcome: Progressing  Flowsheets (Taken 06/30/2021 0213)  Moderate Risk (6-13):   LOW-Fall Interventions Appropriate for Low Fall Risk   MOD-Floor mat at bedside (where available) if appropriate   MOD-Remain with patient during toileting   MOD-Place bedside commode and assistive devices out of sight when not in use   MOD-Apply bed exit alarm if patient is confused   MOD-Request PT/OT consult order for patients with gait/mobility impairment

## 2021-07-01 NOTE — Consults (Signed)
CT guidance used to biopsy L4/5 disc space.  No complications.  See Radiology report for details of procedure.

## 2021-07-01 NOTE — UM Notes (Signed)
[facility] Utilization Management Review Sheet    Facility :  [facility]    NAME: Clayton Davis  MR#: [id number]    CSN#: [id number]    ROOM: 4533/4533-A AGE: 30 y.o.    Date of Birth: Feb 19, 1991    ADMIT DATE AND TIME: 06/28/2021  2:24 AM      PATIENT CLASS: INPT    ATTENDING PHYSICIAN: Clabe Seal, MD  PAYOR:Payor: MEDICAID HMO / Plan: [employer] [place] / Product Type: MANAGED MEDICAID /       AUTH #:     DIAGNOSIS:     ICD-10-CM    1. Lumbar discitis  M46.46       2. Acute osteomyelitis of lumbar spine  M46.26       3. IV drug abuse  F19.10           HISTORY:   Past Medical History:   Diagnosis Date    ADHD     Anxiety     Depression     No known health problems     Sleep apnea      Active Hospital Problems    Diagnosis    Osteomyelitis    Lumbar discitis     10/10 PAIN    BACK PAIN  worsening back pain    LABS WBC 14.1  PLT 543   BUN 23         CT Abdomen Pelvis with IV Cont  Result Date: 06/28/2021  No mass lesion identified. Osteomyelitis/discitis at L4-5. Associated circumferential disc bulge is worse. Subtle stranding in the subcutaneous fat over right buttock is new may suggest contusion  ReadingStation:[facility]      MRI LUMBAR W WO  IMPRESSION:   IMPRESSION:   Osteomyelitis-discitis at L4-5 is confirmed. 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  -X-ray lumbar spine from 05/18/2021 shows possible transverse process fracture  -X-ray lumbar spine from 05/17/2021 shows possible L3 transverse process fracture  -CT abdomen pelvis from 05/26/2021 shows broad-based disc  bulge on L4/L5 causing moderate left neuroforaminal narrowing  Plan  -Start empiric antibiotics vancomycin and cefepime  -Follow-up blood cultures  -Consider ID consult in the a.m.  -Get dedicated lumbar spine MRI  -Urine drug screen    Opioid dependence  -Patient states he is on 145 methadone  -States he already got dose today 06/28/2021  -Pharmacy to confirm dosing before restarting, methadone not ordered  -Patient states he is okay with Percocet and Dilaudid for pain control while inpatient    Anemia  -Hemoglobin 11/39  -Previous hemoglobin 13/42  -Monitor for signs of bleed  -Iron studies in the a.m.    Hyperlipidemia  -Continue statin    Mood disorder/anxiety  -Continue Wellbutrin, buspirone, fluoxetine  -Ativan as needed  DVT PPx: Heparin  Dispo: Inpatient     MEDS  TORADOL 15 MG IV   MORPHINE 4 MG IV ONCE   ZOFRAN 4 MG IV ONCE    MEDS CEFEPIME 2 G IV EVERY 12 HRS   VANCOCIN 1,500 MG IVPB ONCE    9/30  PAIN MANAGEMENT CONSULTED  Assessment:    30 yr old male with:  Acute low back pain secondary to 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  Opioid Use Disorder, on Methadone 145 mg daily, follows with [facility] (rx bottle verified and current. Dr. Patsi Sears), recent relapse with IV cocaine    Multimorbidities: ADHD, Anxiety/depression, hx of PE/COVID infection fall 2021           Plan / Recommendations:   Maximize nonopioid treatments given his opioid tolerance:  Scheduled apap 650 mg q 6 hours   Ketorolac 30 mg iv once, then ibuprofen 600 mg q 6 hrs x 4 days  Cyclobenzaprine 5 mg prn muscle pain/spasms  2.  EKG obtained prior to methadone resumption, at 522  3.  Reduce Methadone 40 mg tid for pain coverage as well as minimize opioid withdrawals  4.  Oxycodone 10 mg q 4 hours PRN  5.  Bowel regimen with opioid therapy. 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  6.  Discussed EKG findings and methadone with Dr. Lanny Hurst, as well as spoke with Dr. Scherrie November (cardiology), both in agreement with  plan to meds that prolong QTc.  7.  Sedation and withdrawal monitoring. Patient declines COWS protocol. Tele monitoring ordered per attending service.    ID CONSULTED    MEDICINE  10/01- wbc normalized since yest . add lidocaine patch . Otherwise plan is the same    OTC prolangation ( not brand new but needs monitoring )   To mitigate risks--Methadone 40 tid ( dose reduced and separated ) , decrease prozac dose by half, daily ekg , tele, monitor lytes    MEDS NS 100 ML/HR IVF   OXYCODONE 5 MG 1-2 TABS PRN X 1    Current Facility-Administered Medications   Medication Dose Route Frequency    acetaminophen  650 mg Oral Q6H    atorvastatin  40 mg Oral Daily    buPROPion SR  150 mg Oral Daily    busPIRone  15 mg Oral Q12H SCH    docusate sodium  200 mg Oral BID    enoxaparin  40 mg Subcutaneous Daily    famotidine  20 mg Oral BID AC    [START ON 06/30/2021] FLUoxetine  10 mg Oral QAM    ibuprofen  600 mg Oral 4 times per day    lactobacillus species  50 Billion CFU Oral Daily    lidocaine  1 patch Transdermal Q24H    methadone  40 mg Oral Q8H SCH    sodium chloride (PF)  3 mL Intravenous Q12H [facility]     10/2  MEDICINE  10/01- wbc normalized since yest . add lidocaine patch . Otherwise plan is the same . Qtc 558  10/2 for IR bx tomorrow.  Hold Advil after noon dose    Opioid dependence  -Patient states he is on 145 methadone at home  -consulted pain mx   - Methadone 40 Mg tid here        OTC prolangation ( not brand new but needs monitoring )   To mitigate risks--Methadone 40 tid ( dose reduced and separated ) , decrease prozac dose by half, daily ekg , tele, monitor  lytes   QTc 558 yest  QTc pending today-- Addendum ekg done -- qtc 524 rate 49 computer reads" borderline ST -elevation lateral leads" I looked at it . I don't see any pathological ST elevations esp when I compare it with older ekgs.        MEDS NS 100 ML/HR IVF   OXYCODONE 5 MG 1-2 TABS PRN X 3    DATE OF REVIEW: 07/01/2021    VITALS: BP 119/70    Pulse (!) 57     Temp 98.8 F (37.1 C) (Temporal)    Resp 16    Ht 1.803 m (5\' 11" )    Wt 86.2 kg (190 lb)    SpO2 96%    BMI 26.50 kg/m  8/10 PAIN    MEDS NS 100 ML/HR IVF   OXYCODONE 5 MG 1-2 TABS PRN X 3    Current Facility-Administered Medications   Medication Dose Route Frequency    acetaminophen  650 mg Oral Q6H    atorvastatin  40 mg Oral Daily    buPROPion SR  150 mg Oral Daily    busPIRone  15 mg Oral Q12H SCH    docusate sodium  200 mg Oral BID    enoxaparin  40 mg Subcutaneous Daily    famotidine  20 mg Oral BID AC    [START ON 06/30/2021] FLUoxetine  10 mg Oral QAM    ibuprofen  600 mg Oral 4 times per day    lactobacillus species  50 Billion CFU Oral Daily    lidocaine  1 patch Transdermal Q24H    methadone  40 mg Oral Q8H SCH    sodium chloride (PF)  3 mL Intravenous Q12H SCH       LABS  WBC 11.6  H&H 10.7/34.8      Tx  IVF  PAIN CONTROL   NPO SURGERY/PROCEDURE 10/3   TELE   VS Q 8 HR          Elesia Pemberton Animator, RN  Utilization Management Review Nurse  [facility] [address]  [address]  [place], [place] [address]  [phone] office Phone, Direct and Confidential  [phone], Fax  [email or web address]

## 2021-07-01 NOTE — Progress Notes (Signed)
Readmission Risk  [facility]   Patient Name: Clayton Davis   Attending Physician: Clabe Seal, MD   Today's date:   07/01/2021 LOS: 3 days   Expected Discharge Date      Readmission Assessment:                                                              Discharge Planning  ReAdmit Risk Score: 18.67  Does the patient have perscription coverage?: Yes  Confirmed PCP with Pt: Yes  Anticipated Home Health at [place]: No  Anticipated Placement at [place]: No  CM Comments: 07/01/21 MSW JKK: Admitted with vertebral osteomyelitis/discitis secondary to IV drug use. Urine drug-screened. ID on board. Pain management. On 145 methadone at home. SW completed IDPA with patient at bedside. Lives with spouse in a 2-level home. Independent with ADLs, no DME or HH use prior to this hospitalization. [place] planning needs to be determined. Started on empiric cefepime and vancomycin. SW will continue to coordinate [place] planning.  (RETIRED) Healthcare Agent's Name: Tilford Deaton  (RETIRED) Healthcare Agent's Phone Number: [phone]     IDPA:      Healthcare Decisions  Interviewed:: Patient, Family  Orientation/Decision Making Abilities of Patient: Alert and Oriented x3, able to make decisions  Advance Directive: Patient does not have advance directive  Healthcare Agent Appointed: No  (RETIRED) Healthcare Agent's Name: Robt Okuda  (RETIRED) Healthcare Agent's Phone Number: [phone]  Prior to admission  Type of Residence: Private residence  Living Arrangements: Spouse/significant other  Dressing: Independent  Grooming: Independent  Feeding: Independent  Bathing: Independent  Toileting: Independent  Discharge Planning  Support Systems: Spouse/significant other  Patient expects to be discharged to:: home  Does the patient have perscription coverage?: Yes  Consults/Providers  PT Evaluation Needed: Yes (Comment)  OT Evalulation Needed: Yes (Comment)  SLP Evaluation Needed: No      30 Day Readmission:        Provider Notifications:      Pauline Aus, LMSW  Discharge Planner/Social Worker  Tel: [phone]

## 2021-07-01 NOTE — Teleconsult (Signed)
Infectious Disease E-Consult Tele-ID Progress Note [facility]  [employer]   Patient Name: Clayton Davis   Attending Physician: Clabe Seal, MD   Primary Care Physician: Pcp, None, MD     Visit Information:   Patient was not seen.    Subjective  "This patient recommendation is based on a telemedicine consult request which was completed asynchronously through chart review and information provided by the primary physician. The patient was not seen or examined today. The evaluation is consultative in nature and all patient care and treatment decisions can either be accepted or rejected by the patient's primary hospital-based treating physician using their own independent medical judgement for their patient."    AF and VSS. Aspiration planned for today. Blood cultures NGTD    Impression  30 year old man with a history of SUD on methadone, recent IVDU, ADHD, and HLD who presented on 9/30 with back pain and was found to have L4-5 OM/discitis without abscess on MRI. He was on cefepime and vancomycin, but antibiotics have been held since 9/30 for a planned needle biopsy today.     #L4-L5 discitis/osteomyelitis, intramuscular involvement of medial Bl psoas muscles  #SUD on methadone, recent IVDU    Recommendation  - Will sent HIV, Hepatitis panel, RPR  - Please start empiric cefepime and vancomycin after IR aspiration is done. Tissue to be sent for bacterial, fungal and AFB cultures and histopath.    ID will follow.   Maeola Sarah, MD  Clinical Assistant Professor  [facility]  Division of Infectious Diseases  [employer] [facility]  [facility] Call Center: [phone]  Pager: Tele ID 7 Service  Pager: [phone] (Personal)      Microbiology:  9/30 blood x2 NGTD  10/3 aspiration cultures pending    Inpatient Medications    Antibiotic Medications:  Antibiotics (From admission, onward)      None            Objective   Vitals: T:98.8 F (37.1 C) (Temporal),  BP:119/70, HR:(!) 57, RR:16,  SaO2:96%,FiO2-O2 (L/m): , Dosing Wt:  Wt Readings from Last 1 Encounters:   06/28/21 86.2 kg (190 lb)   ,   ONG:EXBM mass index is 26.5 kg/m.    Patient was not seen or examined.         Results Review    Labs:  Estimated Creatinine Clearance: 157.6 mL/min (A) (based on SCr of 0.73 mg/dL (L)).  Recent Labs   Lab 07/01/21  0403 06/30/21  0448   WBC 11.6* 9.8   RBC 4.31 4.39   Hemoglobin 10.7* 10.9*   Hematocrit 34.8* 35.6*   MCV 81 81   PLT CT 455* 444*     Recent Labs   Lab 06/28/21  0804   PT 12.5*   PT INR 1.2*         Lab Results   Component Value Date    HGBA1CPERCNT 5.3 01/23/2021     Recent Labs   Lab 07/01/21  0403 06/30/21  0448 06/29/21  0527   Glucose 90 73 118*   Sodium 137 140 140   Potassium 4.1 4.3 4.3   Chloride 100 105 102   CO2 26 26 26    BUN 14 13 18    Creatinine 0.73* 0.67* 0.71*   EGFR 126 129 127   Calcium 9.6 9.6 9.7     Recent Labs   Lab 07/01/21  0403 06/30/21  0448 06/29/21  0527 06/28/21  0310   Magnesium  --   --  1.9 1.8   Phosphorus  --   --  5.0*  --    Albumin 2.8* 2.6* 2.8* 3.3*   Protein, Total 7.4 7.0 7.4 8.7*   Bilirubin, Total 0.4 0.3 0.3 0.3   Alkaline Phosphatase 37* 36* 37* 43   ALT 9 11 13 16    AST (SGOT) 12 11 13 18      Recent Labs   Lab 06/28/21  0455   Urine Specific Gravity 1.017   pH, Urine 5.6       MRI Lumbar Spine W WO Contrast    Result Date: 06/28/2021  IMPRESSION: Osteomyelitis-discitis at L4-5 is confirmed. No abscess  ReadingStation:[facility]    CT Abdomen Pelvis with IV Cont    Result Date: 06/28/2021  No mass lesion identified. Osteomyelitis/discitis at L4-5. Associated circumferential disc bulge is worse. Subtle stranding in the subcutaneous fat over right buttock is new may suggest contusion  ReadingStation:[facility]   Baldo Daub, MD  07/01/21 8:40 AM  MRN: [id number]                                     CSN: [id number]     I spent 15 minutes on chart review and documentation.

## 2021-07-01 NOTE — Progress Notes (Signed)
Pharmacy Vancomycin Dosing Consult Note  Clayton Davis    Assessment:   Indication: osteomyelitis and discitis  Day #1 Vancomycin for osteo and discitis for 29 yoM.  Pt was also started on Cefepime.  PMH includes vertebral osteomyelitis/discitis secondary to IV drug use, anemia, HLD,and QTC prolongation.  Pt presents with complaints of lower back pain and spreading to the right lower extremity.  Today CT guidance was used to biopsy L4/5 disc space.  ID following, start current antibiotics after IR aspiration is done.   Afebrile, mild leukocytosis, renal function stable.       Plan:   Vancomycin 1500 mg IV q12H.  A load of 1500 mg was ordered.    Vancomycin Pharmacokinetic target: AUC24 (range) 400-600 mg/L/hr  Serum creatinine daily  Levels: TBD.  MRSA nares n/a.  Pharmacy will follow the patient's renal function, vancomycin levels, and dosing during the course of therapy. If you have any questions, please contact the pharmacist at [phone].      Age: 30 y.o.  Height: 1.803 m (5\' 11" )  Weight:  86.2 kg (190 lb)  IBW: 75 kg  DW: n/a     Baseline Population Estimated Kinetics: Individual Kinetics (Once Levels Resulted):   SCr: 0.73 mg/dL    CrCl: >604 ml/min    Vancomycin Clearance: 5.38 L/hr    Volume: 72 L  For patients with BMI >40, [facility] will assume a standard Volume of     t50 (half-life): 9.5 hours SCr:  mg/dL    CrCl:  ml/min    Vancomycin Clearance:  L/hr    Volume:  L  For patients with BMI >40, [facility] will assume a standard Volume of 25 L    t50 (half-life):  hours     Expected AUC24 - steady state, Trough - steady state, and Risk of nephrotoxicity  Goal: AUC24,ss: 400 - 600 mg/L/hr  Goal: Probability of AUC24 > 400: Greater than 70%  Goal: Probability of nephrotoxicity: Less than 20%     07/01/21    Exposure target: AUC24 (range)400-600 mg/L.hr   AUC24,ss: 550 mg/L.hr  Probability of AUC24 > 400: 81 %  Ctrough,ss: 16.6 mg/L  Probability of Ctrough,ss > 20: 35 %  Probability of nephrotoxicity  (Lodise CID 2009): 12 %         Historic Patient Regimen   Date Regimen Weight SCr CrCl Trough Level               Recent Labs   Lab 07/01/21  0403 06/30/21  0448 06/29/21  0527 06/28/21  0310   Creatinine 0.73* 0.67* 0.71* 0.91   BUN 14 13 18  23*   WBC 11.6* 9.8 10.0 14.1*     Temp (24hrs), Avg:98 F (36.7 C), Min:97.2 F (36.2 C), Max:98.8 F (37.1 C)    Patient Vitals for the past 12 hrs:   BP Temp Pulse Resp   07/01/21 1544 123/83 -- (!) 101 18   07/01/21 1530 131/83 -- (!) 109 18   07/01/21 0703 119/70 98.8 F (37.1 C) (!) 57 16        Microbiology Results (last 15 days)       Procedure Component Value Units Date/Time    Lab Specimen Yes [[id number]] Collected: 07/01/21 1545    Order Status: Completed Specimen: Other Updated: 07/01/21 1634     Lab Specimen Order Notification Lab Specimen Notification     Comment: The above 1 analytes were performed by [facility] Main Lab [phone])  [address],[place] [address]         AFB (Mycobacterium) Culture and Smear [[id number]] Collected: 07/01/21 1545    Order Status: Sent Specimen: Abscess from Seroma Updated: 07/01/21 1634    Multi-Specimen Fungal Culture and Smear [[id number]] Collected: 07/01/21 1545    Order Status: Sent Specimen: Abscess from Spine Updated: 07/01/21 1634    Abscess Culture and Smear [[id number]] Collected: 07/01/21 1545    Order Status: Sent Specimen: Abscess from Spine Updated: 07/01/21 1634    Lab Specimen Yes [[id number]] Collected: 07/01/21 1545    Order Status: Completed Specimen: Other Updated: 07/01/21 1634     Lab Specimen Order Notification Lab Specimen Notification     Comment: The above 1 analytes were performed by [facility] Lab [phone])  [address],[place] [address]         Sterile Body Fluid Culture and Smear [[id number]] Collected: 07/01/21 1545    Order Status: Canceled Specimen: Other from Seroma     Lab Specimen Yes [[id number]]     Order Status: Canceled     Abscess Culture and  Smear [[id number]]     Order Status: Canceled Specimen: Abscess from Spine     Sterile Body Fluid Culture and Smear [[id number]]     Order Status: Canceled Specimen: Other from Seroma     Blood Culture - Venipuncture [[id number]] Collected: 06/28/21 0717    Order Status: Completed Specimen: Blood from Venipuncture Updated: 06/29/21 2044     Culture Result No Growth To Date    Blood Culture - Venipuncture [[id number]] Collected: 06/28/21 0450    Order Status: Completed Specimen: Blood from Venipuncture Updated: 06/29/21 2044     Culture Result --     Blood culture volume for this collection was less than the optimal needed to acheive adequate sensitivity.  No Growth To Date

## 2021-07-01 NOTE — Progress Notes (Signed)
[facility] PAIN MANAGEMENT PROGRESS NOTE    Subjective   Patient reports of having a horrible back pain over the weekend and now spreading to the Right LE. He denies withdrawals from lowered methadone, but he feels that he is withdrawing from "Xanax" which he supposedly took twice a day, which he revises afterwards to clonazepam. PMP of clonazepam was sporadic, with last Rx only 4 pills for 4 days filled on 05/31/2021.       He is NPO for IR procedure today. He reports having BMs last couple of days.     QTc remains prolonged per serial EKG.    Objectives:     Vitals:    07/01/21 0703   BP: 119/70   Pulse: (!) 57   Resp: 16   Temp: 98.8 F (37.1 C)   SpO2: 96%     General appearance - supine in bed, awake, no acute distress  Mental status -alert, oriented to person, place, and time  CV - RRR, no murmurs  Chest - clear, diminished, no rales or wheezes  Abdomen - soft, mildly tender, BS present, no nv    Neurological - no myoclonus, no tremors, no paresthesia of lower extremities  Musculoskeletal - +low back pain radiating to the bilateral groin, and down to the RLE at mid-thigh/knee, very guarded with movement/exam, but full BLE ROM throughout.     Extremities -peripheral pulses normal, no pedal edema, no cyanosis  Skin -pale coloration, warm/dry, no piloerection, no rashes.  Psych - anxious, talkative, occ emotional when discussing his back pain and issues as well as anxiety, requesting xanax then clonazepam    Labs/Imaging:  Recent Labs     07/01/21  0403   WBC 11.6*   Hemoglobin 10.7*   Hematocrit 34.8*     No results for input(s): PT, INR, PTT in the last 24 hours.  Recent Labs     07/01/21  0403   BUN 14   Creatinine 0.73*   Potassium 4.1   AST (SGOT) 12   ALT 9        Assessment/Plan    30 yr old male with:  Acute low back pain secondary to 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  Opioid Use Disorder, on Methadone 145 mg daily, follows with [facility] (rx bottle verified and current. Dr. Patsi Sears),  recent relapse with IV cocaine    Multimorbidities: ADHD, Anxiety/depression, hx of PE/COVID infection fall 2021      - Decrease methadone to 35 mg tid given his persistent though improving QTc interval. Discussed risk of cardiac arrhythmia with patient/spouse, and he is agreeable to further reduction.  - add clonidine 0.1 mg tid prn for opioid withdrawal symptoms, hold for hypotension SBP less than 100 or bradycardia  - Add ketorolac 30 mg q 6 hours for 6 doses, will resume po nsaids thereafter.  - pregabalin 25 mg tid for back pain/radicular pain and also may help with his anxiety  - metaxalone 400 mg tid scheduled for msk pain  - continue with other analgesic agents.  - would continue to avoid BZ given his multiple risk factors for oversedation  - discussed pain management expectations, which he will likely have prolonged pain if not chronic low back pain because of bone/spine infection.  - continuous pulse oximetry with tele monitoring    Cleatis Polka [facility]  07/01/2021

## 2021-07-02 LAB — ECG 12-LEAD
P Wave Axis: 45 deg
P-R Interval: 147 ms
Patient Age: 29 years
Q-T Interval(Corrected): 510 ms
Q-T Interval: 514 ms
QRS Axis: 43 deg
QRS Duration: 103 ms
T Axis: 34 years
Ventricular Rate: 59 //min

## 2021-07-02 LAB — CBC AND DIFFERENTIAL
Basophils %: 0.3 % (ref 0.0–3.0)
Basophils Absolute: 0 10*3/uL (ref 0.0–0.3)
Eosinophils %: 2.1 % (ref 0.0–7.0)
Eosinophils Absolute: 0.2 10*3/uL (ref 0.0–0.8)
Hematocrit: 35.4 % — ABNORMAL LOW (ref 39.0–52.5)
Hemoglobin: 11.1 gm/dL — ABNORMAL LOW (ref 13.0–17.5)
Lymphocytes Absolute: 2.9 10*3/uL (ref 0.6–5.1)
Lymphocytes: 29.1 % (ref 15.0–46.0)
MCH: 25 pg — ABNORMAL LOW (ref 28–35)
MCHC: 31 gm/dL — ABNORMAL LOW (ref 32–36)
MCV: 80 fL (ref 80–100)
MPV: 7.1 fL (ref 6.0–10.0)
Monocytes Absolute: 1.1 10*3/uL (ref 0.1–1.7)
Monocytes: 11.2 % (ref 3.0–15.0)
Neutrophils %: 57.2 % (ref 42.0–78.0)
Neutrophils Absolute: 5.8 10*3/uL (ref 1.7–8.6)
PLT CT: 438 10*3/uL (ref 130–440)
RBC: 4.43 10*6/uL (ref 4.00–5.70)
RDW: 12 % (ref 11.0–14.0)
WBC: 10.1 10*3/uL (ref 4.0–11.0)

## 2021-07-02 LAB — COMPREHENSIVE METABOLIC PANEL
ALT: 19 U/L (ref 0–55)
AST (SGOT): 23 U/L (ref 10–42)
Albumin/Globulin Ratio: 0.59 Ratio — ABNORMAL LOW (ref 0.80–2.00)
Albumin: 2.7 gm/dL — ABNORMAL LOW (ref 3.5–5.0)
Alkaline Phosphatase: 36 U/L — ABNORMAL LOW (ref 40–145)
Anion Gap: 14.6 mMol/L (ref 7.0–18.0)
BUN / Creatinine Ratio: 23.6 Ratio (ref 10.0–30.0)
BUN: 17 mg/dL (ref 7–22)
Bilirubin, Total: 0.3 mg/dL (ref 0.1–1.2)
CO2: 23.1 mMol/L (ref 20.0–30.0)
Calcium: 9.2 mg/dL (ref 8.5–10.5)
Chloride: 103 mMol/L (ref 98–110)
Creatinine: 0.72 mg/dL — ABNORMAL LOW (ref 0.80–1.30)
EGFR: 126 mL/min/{1.73_m2} (ref 60–150)
Globulin: 4.6 gm/dL — ABNORMAL HIGH (ref 2.0–4.0)
Glucose: 78 mg/dL (ref 71–99)
Osmolality Calculated: 274 mOsm/kg — ABNORMAL LOW (ref 275–300)
Potassium: 3.7 mMol/L (ref 3.5–5.3)
Protein, Total: 7.3 gm/dL (ref 6.0–8.3)
Sodium: 137 mMol/L (ref 136–147)

## 2021-07-02 LAB — RPR: RPR: NONREACTIVE

## 2021-07-02 MED ORDER — SODIUM CHLORIDE 0.9 % IV SOLN
INTRAVENOUS | Status: DC
Start: 2021-07-02 — End: 2021-07-02

## 2021-07-02 MED ORDER — METAXALONE 800 MG PO TABS
800.0000 mg | ORAL_TABLET | Freq: Three times a day (TID) | ORAL | Status: DC
Start: 2021-07-02 — End: 2021-07-08
  Administered 2021-07-02 – 2021-07-08 (×18): 800 mg via ORAL
  Filled 2021-07-02 (×20): qty 1

## 2021-07-02 MED ORDER — CELECOXIB 100 MG PO CAPS
200.0000 mg | ORAL_CAPSULE | Freq: Two times a day (BID) | ORAL | Status: DC
Start: 2021-07-03 — End: 2021-07-08
  Administered 2021-07-03 – 2021-07-08 (×11): 200 mg via ORAL
  Filled 2021-07-02 (×11): qty 2

## 2021-07-02 MED ORDER — POLYETHYLENE GLYCOL 3350 17 G PO PACK
17.0000 g | PACK | Freq: Every day | ORAL | Status: DC
Start: 2021-07-02 — End: 2021-07-09
  Administered 2021-07-03 – 2021-07-09 (×2): 17 g via ORAL
  Filled 2021-07-02 (×6): qty 1

## 2021-07-02 MED ORDER — ENOXAPARIN SODIUM 40 MG/0.4ML IJ SOSY
40.0000 mg | PREFILLED_SYRINGE | INTRAMUSCULAR | Status: DC
Start: 2021-07-02 — End: 2021-07-09
  Administered 2021-07-02 – 2021-07-08 (×5): 40 mg via SUBCUTANEOUS
  Filled 2021-07-02 (×6): qty 0.4

## 2021-07-02 NOTE — Consults (Addendum)
Pt reports pain to IV site for approximately 12 hours intermittently.  Pt has palpable venous cord and pain along path of cannula with non-compressibility noted to cephalic vein in right forearm.  Pt scores 4/5 on visual infusion phlebitis scale to RFA.Marland Kitchen  No other vessels visualized in either upper extremity >1.5 mm in diameter with exception to his brachial/axillary junction of his RUE.  LUE brachial vessel is restricted due to size, depth and brachial artery positioned superiorly.  Pt is receiving IV vancomycin and cefepime with no final recommendations for definitive line per ID at this time.  Discussed vascular limitations with patient's attending physician.  Offered cannulation; however, not recommended as vessel to catheter diameter is less than ideal and previous IV injury noted.  Covering attending reports that she will call directly if needed after further investigation of patient's needs.

## 2021-07-02 NOTE — Progress Notes (Signed)
Called by Dr. Waynetta Sandy for PIV placement.    #22g placed in left upper arm x1 attempt, tolerated well. Primary RN updated.    Maryjane Hurter, BSN RN CCRN  Rapid Response Team

## 2021-07-02 NOTE — Progress Notes (Signed)
[facility]  PROGRESS NOTE      Patient: Clayton Davis  Date: 07/02/2021   LOS: 4 Days  Admission Date: 06/28/2021   MRN: [id number]  Attending: Clabe Seal, MD       Please contact me via Epic chat for routine matters and via phone [phone] for urgent matters.    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  I therefore IR procedure aborted.  I was notified by IR PA they are scheduling it for Monday.       ID physician Dr. Luisa Hart communicated with me.  She advised not to use antibiotic as patient is stable.  She is okay for antibiotic to resume over the weekend if patient keeps getting sick and febrile.  The idea behind this is so that they specimen from the aspiration would be high yield to give [place] a more targeted antibiotic therapy.      10/01- wbc normalized since yest . add lidocaine patch . Otherwise plan is the same . Qtc 558  10/2 for IR bx tomorrow.  Hold Advil after noon dose      10/3 IR aspiration done and abx IV resumed .       10/4 Slept better .       SUBJECTIVE     Clayton Davis afebrile   Complains of back pain but looks more content   Slept better  Wife is in the room and supportive         NAD otherwise       ASSESSMENT/PLAN     Clayton Davis is a 30 y.o. 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  -X-ray lumbar spine from 05/18/2021 shows possible transverse process fracture  -X-ray lumbar spine from 05/17/2021 shows possible L3 transverse process fracture  -CT abdomen pelvis from 05/26/2021 shows broad-based disc bulge on L4/L5 causing  moderate left neuroforaminal narrowing    -Follow-up blood cultures    9/30- Apparently before noon patient managed to eat brown and Frappuccino even though he was made n.p.o..  I therefore IR procedure aborted.  I was notified by IR PA they are scheduling it for Monday.       ID physician Dr. Luisa Hart communicated with me.  She advised not to use antibiotic as patient is stable.  She is okay for antibiotic to resume over the weekend if patient keeps getting sick and febrile.  The idea behind this is so that they specimen from the aspiration would be high yield to give [place] a more targeted antibiotic therapy.      10/01- wbc normalized since yest . add lidocaine patch . Otherwise plan is the same . Qtc 558  10/2 for IR bx tomorrow.  Hold Advil after noon dose      10/3 for interventional radiology aspiration today.  I spoke to wife in the room and I also explained to her regards to limitations for pain management in regards to QTC prolongation and potential risks  Later  IR aspiration done yest  and abx resumed .     10/4 continue iv abx . Await cultures . ID is on board    Opioid dependence  Pain management   -Patient states he is on 145 methadone at home  -appreciated  pain mx   - Methadone 40 Mg tid here then 35 mg tid   - Lidocaine patch  -- Lyrical added   - prn oxycodone       OTC prolangation ( not brand new but needs monitoring )   To mitigate risks--Methadone 145 mg daily  dose reduced and separated to 40 tid  , decreased prozac dose by half  Daily ekg , tele, monitor lytes   QTc 558--> 524--> 510 this am        Anemia  -Hemoglobin 11/39 on admission   -Previous hemoglobin 13/42  -Monitor for signs of bleed  -Iron studies -- suggests sequestration   Prophylactic pepcid  Lab Results   Component Value Date    HGB 11.1 (L) 07/02/2021       Hep C ab positive   Out pt follow up with gi/hepatologist   I updated him on this       Hyperlipidemia  -Continue statin    Mood disorder/anxiety  -Continue Wellbutrin,  buspirone, fluoxetine    Constipation   Bowel regimen       Dispo:   He willl Need PT and OT when his pains good enough to be able to participate   Continue current care         DVT Prophylaxis: Resume Lovenox         Code Status: Full Code      MEDICATIONS     Current Facility-Administered Medications   Medication Dose Route Frequency    acetaminophen  650 mg Oral Q6H    atorvastatin  40 mg Oral Daily    buPROPion SR  150 mg Oral Daily    busPIRone  15 mg Oral Q12H SCH    cefepime  2 g Intravenous Q12H    docusate sodium  200 mg Oral BID    famotidine  20 mg Oral BID AC    FLUoxetine  10 mg Oral QAM    ketorolac  30 mg Intravenous 4 times per day    lactobacillus species  50 Billion CFU Oral Daily    lidocaine  1 patch Transdermal Q24H    metaxalone  400 mg Oral TID    methadone  35 mg Oral Q8H SCH    pregabalin  25 mg Oral TID    sodium chloride (PF)  3 mL Intravenous Q12H SCH    vancomycin  1,500 mg Intravenous Q12H    vancomycin therapy placeholder   Does not apply See Admin Instructions       PHYSICAL EXAM     Vitals:    07/02/21 0715   BP: 112/72   Pulse: (!) 58   Resp: 16   Temp: 98.2 F (36.8 C)   SpO2: 97%       Temperature: Temp  Min: 98.2 F (36.8 C)  Max: 98.4 F (36.9 C)  Pulse: Pulse  Min: 58  Max: 109  Respiratory: Resp  Min: 16  Max: 18  Non-Invasive BP: BP  Min: 112/72  Max: 131/83  Pulse Oximetry SpO2  Min: 96 %  Max: 98 %      Constitutional: Seems to be NAD if doesn't move   HEENT: NC/AT, PERRL,EOMI. Neck supple  Cardiovascular: RRytm, normal S1 S2, no  murmurs, gallops.  Respiratory:  CTA bilaterally Normal rate. No retractions or increased work of breathing.  Gastrointestinal: Normal BS, non-distended, soft, non-tender, no rebound or guarding.  Genitourinary: no suprapubic or costovertebral angle tenderness  Musculoskeletal: Lower extremity movement limited by pain   skin: no rashes, jaundice , cyanosis  Neurologic: AAO x3.  Able to move lower extremities but painful back   Psychiatric: Flat  affect . Cooperative.     LABS     Recent Labs   Lab 07/02/21  0359 07/01/21  0403 06/30/21  0448   WBC 10.1 11.6* 9.8   RBC 4.43 4.31 4.39   Hemoglobin 11.1* 10.7* 10.9*   Hematocrit 35.4* 34.8* 35.6*   MCV 80 81 81   PLT CT 438 455* 444*         Recent Labs   Lab 07/02/21  0359 07/01/21  0403 06/30/21  0448 06/29/21  0527 06/28/21  0310   Sodium 137 137 140 140 140   Potassium 3.7 4.1 4.3 4.3 4.4   Chloride 103 100 105 102 99   CO2 23.1 26 26 26  31*   BUN 17 14 13 18  23*   Creatinine 0.72* 0.73* 0.67* 0.71* 0.91   Glucose 78 90 73 118* 90   Calcium 9.2 9.6 9.6 9.7 10.2   Magnesium  --   --   --  1.9 1.8         Recent Labs   Lab 07/02/21  0359 07/01/21  0403 06/30/21  0448   ALT 19 9 11    AST (SGOT) 23 12 11    Bilirubin, Total 0.3 0.4 0.3   Albumin 2.7* 2.8* 2.6*   Alkaline Phosphatase 36* 37* 36*               Recent Labs   Lab 06/28/21  0804   PT INR 1.2*   PT 12.5*         Microbiology Results (last 15 days)       Procedure Component Value Units Date/Time    Lab Specimen Yes [[id number]] Collected: 07/01/21 1545    Order Status: Completed Specimen: Other Updated: 07/01/21 1634     Lab Specimen Order Notification Lab Specimen Notification     Comment: The above 1 analytes were performed by [facility] Main Lab [phone])  [address],[place] [address]         AFB (Mycobacterium) Culture and Smear [[id number]] Collected: 07/01/21 1545    Order Status: Canceled Specimen: Abscess from Seroma     Multi-Specimen Fungal Culture and Smear [[id number]] Collected: 07/01/21 1545    Order Status: Sent Specimen: Abscess from Spine Updated: 07/01/21 1634    Abscess Culture and Smear [[id number]] Collected: 07/01/21 1545    Order Status: Completed Specimen: Abscess from Spine Updated: 07/01/21 1930     Gram Stain --     No Epithelial Cells  Occasional WBC's Seen  No Organisms Seen      Lab Specimen Yes [[id number]] Collected: 07/01/21 1545    Order Status: Completed Specimen: Other Updated: 07/01/21 1634      Lab Specimen Order Notification Lab Specimen Notification     Comment: The above 1 analytes were performed by [facility] Main Lab [phone])  [address],[place] [address]         Sterile Body Fluid Culture and Smear [[id number]] Collected: 07/01/21 1545    Order Status: Canceled Specimen: Other from Seroma     Lab Specimen Yes [[id number]]  Order Status: Canceled     Abscess Culture and Smear [[id number]]     Order Status: Canceled Specimen: Abscess from Spine     Sterile Body Fluid Culture and Smear [[id number]]     Order Status: Canceled Specimen: Other from Seroma     Blood Culture - Venipuncture [[id number]] Collected: 06/28/21 0717    Order Status: Completed Specimen: Blood from Venipuncture Updated: 06/29/21 2044     Culture Result No Growth To Date    Blood Culture - Venipuncture [[id number]] Collected: 06/28/21 0450    Order Status: Completed Specimen: Blood from Venipuncture Updated: 06/29/21 2044     Culture Result --     Blood culture volume for this collection was less than the optimal needed to acheive adequate sensitivity.  No Growth To Date               RADIOLOGY     No results found.    HH needs if any:  There are no questions and answers to display.       Nutrition assessment (if any) in collaboration with medical nutritionist :            Signed,  Clabe Seal, MD  8:05 AM 07/02/2021

## 2021-07-02 NOTE — Plan of Care (Addendum)
NURSE NOTE SUMMARY  [facility] [address]   Patient Name: Clayton Davis   Attending Physician: Clabe Seal, MD   Today's date:   07/02/2021 LOS: 4 days   Shift Summary:                                                              Shift report received. Pt assessed. Pt alert and oriented. IV in place and infusing. Pt with complaints of pain 8/10. Pt lacks grimacing and is not tearful. Requested oral pain medication, however was not due at that time. Visitor at bedside. AM medications taken well. Denied wanting any stool softeners. Pt aware that MD was placing orders for bowel protocol. Educated pt on the importance of bowel elimination. Pt did agree to take colace only and refused miralax. Pt would not allow nurse to place lidocaine patch to back also mentioning that is doesn't work. Pt remains calm and cooperative. Voiding well throughout shift. Taking medications well. Able to move lower and upper extremities.  Dr Lanny Hurst okay with continuous NS to be stopped.   Evening medications taken well.   Pt complaining of some burning after IV completed. IV flushed easy, no redness, swelling or leaking around dressing. Notified Vascular access to place another IV prior to next antibiotic.   Shift report given. End of care.      Provider Notifications:        Rapid Response Notifications:  Mobility:      PMP Activity: Step 6 - Walks in Room (07/02/2021  8:08 AM)     Weight tracking:  Family Dynamic:   No data found.          Last Bowel Movement   No data recorded       Problem: Compromised Hemodynamic Status  Goal: Vital signs and fluid balance maintained/improved  Outcome: Progressing  Flowsheets (Taken 06/30/2021 0213 by Ferdie Ping, RN)  Vital signs and fluid balance are maintained/improved:   Position patient for maximum circulation/cardiac output   Monitor/assess vitals and hemodynamic parameters with position changes   Monitor/assess lab values and report abnormal values     Problem:  Impaired Mobility  Goal: Mobility/Activity is maintained at optimal level for patient  Outcome: Progressing  Flowsheets (Taken 06/30/2021 0213 by Ferdie Ping, RN)  Mobility/activity is maintained at optimal level for patient:   Increase mobility as tolerated/progressive mobility   Maintain proper body alignment   Plan activities to conserve energy, plan rest periods   Reposition patient every 2 hours and as needed unless able to reposition self   Assess for changes in respiratory status, level of consciousness and/or development of fatigue     Problem: Peripheral Neurovascular Impairment  Goal: Extremity color, movement, sensation are maintained or improved  Outcome: Progressing  Flowsheets (Taken 06/30/2021 0213 by Ferdie Ping, RN)  Extremity color, movement, sensation are maintained or improved:   Increase mobility as tolerated/progressive mobility   Assess extremity for proper alignment   Teach/review/reinforce ankle pump exercises     Problem: Compromised skin integrity  Goal: Skin integrity is maintained or improved  Outcome: Progressing  Flowsheets (Taken 06/30/2021 0213 by Ferdie Ping, RN)  Skin integrity is maintained or improved:   Assess Braden Scale every shift  Turn or reposition patient every 2 hours or as needed unless able to reposition self   Avoid shearing   Keep skin clean and dry   Monitor patient's hygiene practices   Keep head of bed 30 degrees or less (unless contraindicated)     Problem: Pain interferes with ability to perform ADL  Goal: Pain at adequate level as identified by patient  Outcome: Progressing  Flowsheets (Taken 06/30/2021 0213 by Ferdie Ping, RN)  Pain at adequate level as identified by patient:   Assess pain on admission, during daily assessment and/or before any "as needed" intervention(s)   Reassess pain within 30-60 minutes of any procedure/intervention, per Pain Assessment, Intervention, Reassessment (AIR) Cycle   Evaluate if patient comfort function goal is met    Evaluate patient's satisfaction with pain management progress   Offer non-pharmacological pain management interventions     Problem: Side Effects from Pain Analgesia  Goal: Patient will experience minimal side effects of analgesic therapy  Outcome: Progressing  Flowsheets (Taken 06/30/2021 0213 by Ferdie Ping, RN)  Patient will experience minimal side effects of analgesic therapy:   Assess for changes in cognitive function   Prevent/manage side effects per LIP orders (i.e. nausea, vomiting, pruritus, constipation, urinary retention, etc.)   Evaluate for opioid-induced sedation with appropriate assessment tool (i.e. POSS)     Problem: Moderate/High Fall Risk Score >5  Goal: Patient will remain free of falls  Outcome: Progressing  Flowsheets (Taken 06/30/2021 0213 by Ferdie Ping, RN)  Moderate Risk (6-13):   LOW-Fall Interventions Appropriate for Low Fall Risk   MOD-Floor mat at bedside (where available) if appropriate   MOD-Remain with patient during toileting   MOD-Place bedside commode and assistive devices out of sight when not in use   MOD-Apply bed exit alarm if patient is confused   MOD-Request PT/OT consult order for patients with gait/mobility impairment

## 2021-07-02 NOTE — Progress Notes (Signed)
Pharmacy Vancomycin Dosing Consult Note  Ezequiel Ganser Lerette    Assessment:   Day #2 vancomycin for osteo and discitis in this 29 yoM.  Patient presented with complaints of lower back pain.  S/p IR guided biopsy 10/3.  PMH includes IVDU, anemia, HLD, mood disorder, and QTc prolongation.  ID following.     Afebrile, WBC WNL, renal function stable.  Spinal culture growing GNRs.       Plan:   Vancomycin 1500 mg IV q12H.    Vancomycin Pharmacokinetic target: AUC24 (range) 400-600 mg/L/hr  Serum creatinine daily  AUC random level 10/5 with AM labs  Pharmacy will follow the patient's renal function, vancomycin levels, and dosing during the course of therapy. If you have any questions, please contact the pharmacist at [phone].      Age: 30 y.o.  Height: 1.803 m (5\' 11" )  Weight:  86.2 kg (190 lb)  IBW: 75 kg       Baseline Population Estimated Kinetics: Individual Kinetics (Once Levels Resulted):   SCr: 0.72 mg/dL    CrCl: >213 ml/min    Vancomycin Clearance: 5.38 L/hr    Volume: 72 L    t50 (half-life): 9.9 hours SCr:  mg/dL    CrCl:  ml/min    Vancomycin Clearance:  L/hr    Volume:  L    t50 (half-life):  hours     Expected AUC24 - steady state, Trough - steady state, and Risk of nephrotoxicity  Goal: AUC24,ss: 400 - 600 mg/L/hr  Goal: Probability of AUC24 > 400: Greater than 70%  Goal: Probability of nephrotoxicity: Less than 20%     Regimen: 1500 mg IV every 12 hours  AUC24,ss: 557 mg/L.hr  Probability of AUC24 > 400: 80 %  Ctrough,ss: 16.8 mg/L  Probability of Ctrough,ss > 20: 37 %  Probability of nephrotoxicity (Lodise CID 2009): 12 %       Recent Labs   Lab 07/02/21  0359 07/01/21  0403 06/30/21  0448 06/29/21  0527   Creatinine 0.72* 0.73* 0.67* 0.71*   BUN 17 14 13 18    WBC 10.1 11.6* 9.8 10.0       Temp (24hrs), Avg:98.3 F (36.8 C), Min:98.2 F (36.8 C), Max:98.4 F (36.9 C)    Patient Vitals for the past 12 hrs:   BP Temp Pulse Resp   07/02/21 0715 112/72 98.2 F (36.8 C) (!) 58 16          Microbiology Results  (last 15 days)       Procedure Component Value Units Date/Time    Lab Specimen Yes [[id number]] Collected: 07/01/21 1545    Order Status: Completed Specimen: Other Updated: 07/01/21 1634     Lab Specimen Order Notification Lab Specimen Notification     Comment: The above 1 analytes were performed by [facility] Main Lab [phone])  [address],[place] [address]         AFB (Mycobacterium) Culture and Smear [[id number]] Collected: 07/01/21 1545    Order Status: Canceled Specimen: Abscess from Seroma     Multi-Specimen Fungal Culture and Smear [[id number]] Collected: 07/01/21 1545    Order Status: Completed Specimen: Abscess from Spine Updated: 07/02/21 0832     Fungal Smear No Fungal Elements Seen    Abscess Culture and Smear [[id number]]  (Abnormal) Collected: 07/01/21 1545    Order Status: Completed Specimen: Abscess from Spine Updated: 07/02/21 1209     Gram Stain --     No Epithelial Cells  Occasional WBC's Seen  No Organisms Seen       Isolate 1 Scant Growth  Gram Negative Rods  Identification and Sensitivity To Follow      Lab Specimen Yes [[id number]] Collected: 07/01/21 1545    Order Status: Completed Specimen: Other Updated: 07/01/21 1634     Lab Specimen Order Notification Lab Specimen Notification     Comment: The above 1 analytes were performed by [facility] Main Lab [phone])  [address],[place] [address]         Sterile Body Fluid Culture and Smear [[id number]] Collected: 07/01/21 1545    Order Status: Canceled Specimen: Other from Seroma     Lab Specimen Yes [[id number]]     Order Status: Canceled     Abscess Culture and Smear [[id number]]     Order Status: Canceled Specimen: Abscess from Spine     Sterile Body Fluid Culture and Smear [[id number]]     Order Status: Canceled Specimen: Other from Seroma     Blood Culture - Venipuncture [[id number]] Collected: 06/28/21 0717    Order Status: Completed Specimen: Blood from Venipuncture Updated: 06/29/21 2044     Culture  Result No Growth To Date    Blood Culture - Venipuncture [[id number]] Collected: 06/28/21 0450    Order Status: Completed Specimen: Blood from Venipuncture Updated: 06/29/21 2044     Culture Result --     Blood culture volume for this collection was less than the optimal needed to acheive adequate sensitivity.  No Growth To Date

## 2021-07-02 NOTE — Teleconsult (Addendum)
Infectious Disease E-Consult Tele-ID Progress Note [facility]  [facility] [employer]   Patient Name: Clayton Davis   Attending Physician: Clabe Seal, MD   Primary Care Physician: Pcp, None, MD     Visit Information:   Patient was not seen.    Subjective  "This patient recommendation is based on a telemedicine consult request which was completed asynchronously through chart review and information provided by the primary physician. The patient was not seen or examined today. The evaluation is consultative in nature and all patient care and treatment decisions can either be accepted or rejected by the patient's primary hospital-based treating physician using their own independent medical judgement for their patient."    AF and VSS.   Successful aspiration done yesterday with cultures and path pending. NGTD  Hep C ab positive, HIV and RPR negative    Impression  29 year old man with a history of SUD on methadone, recent IVDU, ADHD, and HLD who presented on 9/30 with back pain and was found to have L4-5 OM/discitis without abscess on MRI. IR guided biopsy done 10/3 with cultures and path pending. Now on empiric vancomycin and cefepime. Hep C Ab positive, so will need to check viral load.     #L4-L5 discitis/osteomyelitis, intramuscular involvement of medial Bl psoas muscles  #SUD on methadone, recent IVDU  #Hep C Ab reactive    Recommendation  - Hep C viral load ordered  - Continue empiric cefepime and vancomycin   - Follow-up cultures and histopath  - Antibiotic regimen to be determined by cultures, an oral regimen may be possible, but likely will need an IV regimen inpatient if recent IVDU.     *addendum. Surgical cultures now with growth of GNRs. I would like to continue vancomycin and cefepime to allow for any additional culture growth. It is likely a monomicrobial infection, but given IVDU history, a polymicrobial infection is possible.     ID will follow.   Maeola Sarah, MD  Clinical Assistant  Professor  [facility]  Division of Infectious Diseases  [facility]  [facility] Call Center: [phone]  Pager: Tele ID 7 Service  Pager: [phone] (Personal)      Microbiology:  9/30 blood x2 NGTD  10/3 aspiration cultures GS negative, NGTD  Fungal and AFB smears negative, pending    Objective   Vitals: T:98.2 F (36.8 C) (Temporal),  BP:112/72, HR:(!) 58, RR:16, SaO2:97%,FiO2-O2 (L/m): , Dosing Wt:  Wt Readings from Last 1 Encounters:   06/28/21 86.2 kg (190 lb)   ,   GNF:AOZH mass index is 26.5 kg/m.    Patient was not seen or examined.         Results Review    Labs:  Estimated Creatinine Clearance: 159.8 mL/min (A) (based on SCr of 0.72 mg/dL (L)).  Recent Labs   Lab 07/02/21  0359 07/01/21  0403   WBC 10.1 11.6*   RBC 4.43 4.31   Hemoglobin 11.1* 10.7*   Hematocrit 35.4* 34.8*   MCV 80 81   PLT CT 438 455*       Recent Labs   Lab 06/28/21  0804   PT 12.5*   PT INR 1.2*           Lab Results   Component Value Date    HGBA1CPERCNT 5.3 01/23/2021     Recent Labs   Lab 07/02/21  0359 07/01/21  0403 06/30/21  0448   Glucose 78 90 73   Sodium 137  137 140   Potassium 3.7 4.1 4.3   Chloride 103 100 105   CO2 23.1 26 26    BUN 17 14 13    Creatinine 0.72* 0.73* 0.67*   EGFR 126 126 129   Calcium 9.2 9.6 9.6       Recent Labs   Lab 07/02/21  0359 07/01/21  0403 06/30/21  0448 06/29/21  0527 06/28/21  0310   Magnesium  --   --   --  1.9 1.8   Phosphorus  --   --   --  5.0*  --    Albumin 2.7* 2.8*  More results in Results Review 2.8* 3.3*   Protein, Total 7.3 7.4  More results in Results Review 7.4 8.7*   Bilirubin, Total 0.3 0.4  More results in Results Review 0.3 0.3   Alkaline Phosphatase 36* 37*  More results in Results Review 37* 43   ALT 19 9  More results in Results Review 13 16   AST (SGOT) 23 12  More results in Results Review 13 18   More results in Results Review = values in this interval not displayed.       Recent Labs   Lab 06/28/21  0455   Urine Specific Gravity 1.017   pH, Urine 5.6          MRI Lumbar Spine W WO Contrast    Result Date: 06/28/2021  IMPRESSION: Osteomyelitis-discitis at L4-5 is confirmed. No abscess  ReadingStation:[facility]    CT Abdomen Pelvis with IV Cont    Result Date: 06/28/2021  No mass lesion identified. Osteomyelitis/discitis at L4-5. Associated circumferential disc bulge is worse. Subtle stranding in the subcutaneous fat over right buttock is new may suggest contusion  ReadingStation:[facility]   Baldo Daub, MD  07/02/21 9:24 AM  MRN: [id number]                                     CSN: [id number]     I spent 15 minutes on chart review and documentation.

## 2021-07-02 NOTE — Plan of Care (Addendum)
NURSE NOTE SUMMARY  [place] [facility] [address]   Patient Name: Clayton Davis   Attending Physician: Clabe Seal, MD   Today's date:   07/02/2021 LOS: 4 days   Shift Summary:                                                              Assumed pt care. Assessment and vitals complete.   RFA iv intact infusing NS @ 100  PRN roxicodone given for pain relief.   Voiding in urinal  Unable to visualize dressing due to back pain but area is dry  Morning EKG complete,  QTC 510    Pt resting in bed, call bell within reach, family at bedside.     Report given to oncoming nurse.     Provider Notifications:        Rapid Response Notifications:  Mobility:      PMP Activity: Step 6 - Walks in Room (07/01/2021  9:25 PM)     Weight tracking:  Family Dynamic:   No data found.          Last Bowel Movement   No data recorded        Problem: Compromised Hemodynamic Status  Goal: Vital signs and fluid balance maintained/improved  Outcome: Progressing  Flowsheets (Taken 06/30/2021 0213 by Ferdie Ping, RN)  Vital signs and fluid balance are maintained/improved:   Position patient for maximum circulation/cardiac output   Monitor/assess vitals and hemodynamic parameters with position changes   Monitor/assess lab values and report abnormal values     Problem: Impaired Mobility  Goal: Mobility/Activity is maintained at optimal level for patient  Outcome: Progressing  Flowsheets (Taken 06/30/2021 0213 by Ferdie Ping, RN)  Mobility/activity is maintained at optimal level for patient:   Increase mobility as tolerated/progressive mobility   Maintain proper body alignment   Plan activities to conserve energy, plan rest periods   Reposition patient every 2 hours and as needed unless able to reposition self   Assess for changes in respiratory status, level of consciousness and/or development of fatigue     Problem: Peripheral Neurovascular Impairment  Goal: Extremity color, movement, sensation are maintained or  improved  Outcome: Progressing  Flowsheets (Taken 06/30/2021 0213 by Ferdie Ping, RN)  Extremity color, movement, sensation are maintained or improved:   Increase mobility as tolerated/progressive mobility   Assess extremity for proper alignment   Teach/review/reinforce ankle pump exercises     Problem: Compromised skin integrity  Goal: Skin integrity is maintained or improved  Outcome: Progressing  Flowsheets (Taken 06/30/2021 0213 by Ferdie Ping, RN)  Skin integrity is maintained or improved:   Assess Braden Scale every shift   Turn or reposition patient every 2 hours or as needed unless able to reposition self   Avoid shearing   Keep skin clean and dry   Monitor patient's hygiene practices   Keep head of bed 30 degrees or less (unless contraindicated)     Problem: Pain interferes with ability to perform ADL  Goal: Pain at adequate level as identified by patient  Outcome: Progressing  Flowsheets (Taken 06/30/2021 0213 by Ferdie Ping, RN)  Pain at adequate level as identified by patient:   Assess pain on admission, during daily assessment and/or  before any "as needed" intervention(s)   Reassess pain within 30-60 minutes of any procedure/intervention, per Pain Assessment, Intervention, Reassessment (AIR) Cycle   Evaluate if patient comfort function goal is met   Evaluate patient's satisfaction with pain management progress   Offer non-pharmacological pain management interventions     Problem: Side Effects from Pain Analgesia  Goal: Patient will experience minimal side effects of analgesic therapy  Outcome: Progressing  Flowsheets (Taken 06/30/2021 0213 by Ferdie Ping, RN)  Patient will experience minimal side effects of analgesic therapy:   Assess for changes in cognitive function   Prevent/manage side effects per LIP orders (i.e. nausea, vomiting, pruritus, constipation, urinary retention, etc.)   Evaluate for opioid-induced sedation with appropriate assessment tool (i.e. POSS)     Problem: Moderate/High  Fall Risk Score >5  Goal: Patient will remain free of falls  Outcome: Progressing  Flowsheets (Taken 06/30/2021 0213 by Ferdie Ping, RN)  Moderate Risk (6-13):   LOW-Fall Interventions Appropriate for Low Fall Risk   MOD-Floor mat at bedside (where available) if appropriate   MOD-Remain with patient during toileting   MOD-Place bedside commode and assistive devices out of sight when not in use   MOD-Apply bed exit alarm if patient is confused   MOD-Request PT/OT consult order for patients with gait/mobility impairment

## 2021-07-02 NOTE — Progress Notes (Signed)
[facility] PAIN MANAGEMENT PROGRESS NOTE    Subjective   Patient reports that he remains in significant amount of pain, but was able to sleep better last night. Still complaining of withdrawal from clonazepam (only had 4 pills Rx in early Sept). He feels overwhelmed with all the diagnoses and being unable to be home and work.    Objectives:     Vitals:    07/02/21 0715   BP: 112/72   Pulse: (!) 58   Resp: 16   Temp: 98.2 F (36.8 C)   SpO2: 97%     General appearance -alert, supine in bed, in no distress.  Mental status -oriented to person, place, and time  Abdomen - soft, mildly tender, non-distended, no nausea, no appetite    Neurological - no tremors, no myoclonus, headache or back pain.  BLE sensory grossly intact.  Musculoskeletal - low back pain radiating to bilateral hip/RLE, very guarded with movement (declined turning in bed for examination)  Extremities - no pedal edema, no cyanosis  Skin -normal coloration, warm, dry, no rashes.  Psych - anxious, occ tearful when discussing his current situation and states that his wife is aware of his relapse and is supportive      Labs/Imaging:  Recent Labs     07/02/21  0359   WBC 10.1   Hemoglobin 11.1*   Hematocrit 35.4*     No results for input(s): PT, INR, PTT in the last 24 hours.  Recent Labs     07/02/21  0359   BUN 17   Creatinine 0.72*   Potassium 3.7   AST (SGOT) 23   ALT 19   CT Guided Asp Disc or Paravertebral    Result Date: 07/02/2021  IMPRESSION: Successful percutaneous CT guided L4-L5 Disc Core Biopsy and Aspiration with moderate sedation. 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  Opioid Use Disorder, on Methadone 145 mg daily, follows with [facility] (rx bottle verified and current. Dr. Patsi Sears), recent relapse with IV cocaine    Multimorbidities: ADHD, Anxiety/depression, hx of PE/COVID infection fall 2021       - no changes  in his methadone dose, QTc interval improving  - increase metaxalone to 800 mg tid  - start celecoxib 200 mg bid tomorrow for acute pain, then taper lower in a few days  - monitor renal function closely with above  - advised patient on current QTc interval, that full effect of methadone dose reduction likely has not fully taken effect given its long half life  - reinforced pain expectations given his tolerance to opioids and other substances.    Cleatis Polka DNP  07/02/2021

## 2021-07-02 NOTE — UM Notes (Signed)
CONTINUED STAY REVIEW 07/02/2021    AUTH#:      Clayton Davis  February 28, 1991  MRN: [id number]      MD NOTES:  10/4 continue iv abx . Await cultures . ID is on board    Opioid dependence  Pain management   -Patient states he is on 145 methadone at home  -appreciated  pain mx   - Methadone 40 Mg tid here then 35 mg tid   - Lidocaine patch  -- Lyrical added   - prn oxycodone         OTC prolangation ( not brand new but needs monitoring )   To mitigate risks--Methadone 145 mg daily  dose reduced and separated to 40 tid  , decreased prozac dose by half  Daily ekg , tele, monitor lytes   QTc 558--> 524--> 510 this am      Anemia  -Hemoglobin 11/39 on admission   -Previous hemoglobin 13/42  -Monitor for signs of bleed  -Iron studies -- suggests sequestration   Prophylactic pepcid                CM Comments: 07/01/21 MSW JKK: Admitted with vertebral osteomyelitis/discitis secondary to IV drug use. Urine drug-screened. ID on board. Pain management. On 145 methadone at home. SW completed IDPA with patient at bedside. Lives with spouse in a 2-level home. Independent with ADLs, no DME or HH use prior to this hospitalization. [place] planning needs to be determined. Started on empiric cefepime and vancomycin. SW will continue to coordinate [place] planning.    ID CONSULTED  Recommendation  - Hep C viral load ordered  - Continue empiric cefepime and vancomycin   - Follow-up cultures and histopath  - Antibiotic regimen to be determined by cultures, an oral regimen may be possible, but likely will need an IV regimen inpatient if recent IVDU.     CONTINUES ON THE MEDICAL TELE  FLOOR  IVF     IV ABX   PAIN CONTROL     TELE   VS Q 8 HR            Recent Labs   Lab 07/02/21  0359   WBC 10.1   Hemoglobin 11.1*   Hematocrit 35.4*   PLT CT 438          Recent Labs   Lab 07/02/21  0359   Sodium 137   Potassium 3.7   Chloride 103   CO2 23.1   BUN 17   Creatinine 0.72*   EGFR 126   Glucose 78   Calcium 9.2          VITALS: Blood pressure 112/72, pulse  (!) 58, temperature 98.2 F (36.8 C), temperature source Temporal, resp. rate 16, height 1.803 m (5\' 11" ), weight 86.2 kg (190 lb), SpO2 97 %.      Scheduled Meds:  Current Facility-Administered Medications   Medication Dose Route Frequency    acetaminophen  650 mg Oral Q6H    atorvastatin  40 mg Oral Daily    buPROPion SR  150 mg Oral Daily    busPIRone  15 mg Oral Q12H SCH    cefepime  2 g Intravenous Q12H    docusate sodium  200 mg Oral BID    famotidine  20 mg Oral BID AC    FLUoxetine  10 mg Oral QAM    ketorolac  30 mg Intravenous 4 times per day    lactobacillus species  50 Billion CFU Oral Daily  lidocaine  1 patch Transdermal Q24H    metaxalone  400 mg Oral TID    methadone  35 mg Oral Q8H SCH    pregabalin  25 mg Oral TID    sodium chloride (PF)  3 mL Intravenous Q12H SCH    vancomycin  1,500 mg Intravenous Q12H    vancomycin therapy placeholder   Does not apply See Admin Instructions     Continuous Infusions:   sodium chloride 100 mL/hr at 07/02/21 0004     PRN Meds:.acetaminophen **OR** acetaminophen **OR** acetaminophen, cloNIDine, LORazepam, naloxone, oxyCODONE, polyethylene glycol     PRN OXYCODONE 10 MG PO PRN X 2        [name] BSN, RN  Utilization Management Review Nurse  [facility] [address]  [address]  [place], [place] [address]  [phone] office Phone, Direct and Confidential  [phone], Fax  [email or web address]

## 2021-07-03 ENCOUNTER — Inpatient Hospital Stay: Payer: PRIVATE HEALTH INSURANCE

## 2021-07-03 DIAGNOSIS — B1920 Unspecified viral hepatitis C without hepatic coma: Secondary | ICD-10-CM

## 2021-07-03 DIAGNOSIS — F1910 Other psychoactive substance abuse, uncomplicated: Secondary | ICD-10-CM

## 2021-07-03 DIAGNOSIS — M4626 Osteomyelitis of vertebra, lumbar region: Secondary | ICD-10-CM

## 2021-07-03 LAB — COMPREHENSIVE METABOLIC PANEL
ALT: 48 U/L (ref 0–55)
AST (SGOT): 56 U/L — ABNORMAL HIGH (ref 10–42)
Albumin/Globulin Ratio: 0.63 Ratio — ABNORMAL LOW (ref 0.80–2.00)
Albumin: 2.7 gm/dL — ABNORMAL LOW (ref 3.5–5.0)
Alkaline Phosphatase: 51 U/L (ref 40–145)
Anion Gap: 14.5 mMol/L (ref 7.0–18.0)
BUN / Creatinine Ratio: 17.9 Ratio (ref 10.0–30.0)
BUN: 12 mg/dL (ref 7–22)
Bilirubin, Total: 0.4 mg/dL (ref 0.1–1.2)
CO2: 21 mMol/L (ref 20–30)
Calcium: 9.5 mg/dL (ref 8.5–10.5)
Chloride: 106 mMol/L (ref 98–110)
Creatinine: 0.67 mg/dL — ABNORMAL LOW (ref 0.80–1.30)
EGFR: 129 mL/min/{1.73_m2} (ref 60–150)
Globulin: 4.3 gm/dL — ABNORMAL HIGH (ref 2.0–4.0)
Glucose: 76 mg/dL (ref 71–99)
Osmolality Calculated: 274 mOsm/kg — ABNORMAL LOW (ref 275–300)
Potassium: 3.5 mMol/L (ref 3.5–5.3)
Protein, Total: 7 gm/dL (ref 6.0–8.3)
Sodium: 138 mMol/L (ref 136–147)

## 2021-07-03 LAB — ECG 12-LEAD
P Wave Axis: 57 deg
P-R Interval: 139 ms
Patient Age: 29 years
Q-T Interval(Corrected): 493 ms
Q-T Interval: 485 ms
QRS Axis: 46 deg
QRS Duration: 98 ms
T Axis: 30 years
Ventricular Rate: 62 //min

## 2021-07-03 LAB — CBC AND DIFFERENTIAL
Basophils %: 0.5 % (ref 0.0–3.0)
Basophils Absolute: 0.1 10*3/uL (ref 0.0–0.3)
Eosinophils %: 1.9 % (ref 0.0–7.0)
Eosinophils Absolute: 0.2 10*3/uL (ref 0.0–0.8)
Hematocrit: 34.8 % — ABNORMAL LOW (ref 39.0–52.5)
Hemoglobin: 11.1 gm/dL — ABNORMAL LOW (ref 13.0–17.5)
Lymphocytes Absolute: 2 10*3/uL (ref 0.6–5.1)
Lymphocytes: 17.4 % (ref 15.0–46.0)
MCH: 26 pg — ABNORMAL LOW (ref 28–35)
MCHC: 32 gm/dL (ref 32–36)
MCV: 81 fL (ref 80–100)
MPV: 7.2 fL (ref 6.0–10.0)
Monocytes Absolute: 1.2 10*3/uL (ref 0.1–1.7)
Monocytes: 9.9 % (ref 3.0–15.0)
Neutrophils %: 70.2 % (ref 42.0–78.0)
Neutrophils Absolute: 8.2 10*3/uL (ref 1.7–8.6)
PLT CT: 415 10*3/uL (ref 130–440)
RBC: 4.3 10*6/uL (ref 4.00–5.70)
RDW: 12.1 % (ref 11.0–14.0)
WBC: 11.7 10*3/uL — ABNORMAL HIGH (ref 4.0–11.0)

## 2021-07-03 LAB — VH CULTURE-BLOOD-VENIPUNCTURE: Culture Result: NO GROWTH

## 2021-07-03 LAB — VH VANCOMYCIN AUC: Vancomycin AUC: 22.2 ug/mL (ref 5.0–40.0)

## 2021-07-03 MED ORDER — SODIUM CHLORIDE (PF) 0.9 % IJ SOLN
10.0000 mL | INTRAMUSCULAR | Status: DC | PRN
Start: 2021-07-03 — End: 2021-07-04

## 2021-07-03 MED ORDER — SODIUM CHLORIDE (PF) 0.9 % IJ SOLN
10.0000 mL | Freq: Two times a day (BID) | INTRAMUSCULAR | Status: DC
Start: 2021-07-03 — End: 2021-07-04
  Administered 2021-07-03 – 2021-07-04 (×3): 10 mL

## 2021-07-03 MED ORDER — PREGABALIN 25 MG PO CAPS
50.0000 mg | ORAL_CAPSULE | Freq: Three times a day (TID) | ORAL | Status: DC
Start: 2021-07-03 — End: 2021-07-09
  Administered 2021-07-03 – 2021-07-09 (×19): 50 mg via ORAL
  Filled 2021-07-03 (×19): qty 2

## 2021-07-03 NOTE — Consults (Signed)
Inpatient Consult to Interventional Radiology  Consult performed by: Jenness Corner, MD  Consult ordered by: Jenness Corner, MD    PICC line placed  -placement confirmed  -ready for use  -image on synapse  -procedure performed with Dr. Anselm Jungling, see his radiology report for further details    Dub Mikes, RA

## 2021-07-03 NOTE — Teleconsult (Signed)
The patient's informed consent to perform this consultation using telehealth tools was obtained: Yes    Telehealth patient education given prior to telehealth session:Yes    Video consult by DR. Neils and assessment completed. The patient's wife, Luisa Dago, at the bedside at the time of consult, and patient gave verbal consent for her to attend. Questions answered and plan of care reviewed with the patient and his spouse.    Start Time: 0929  End Time: [phone]    Staff present during the patient's telehealth session: Yes  Lenard Forth, RN

## 2021-07-03 NOTE — UM Notes (Signed)
CONTINUED STAY REVIEW 07/03/2021    AUTH#:      Clayton Davis  30-Dec-1990  MRN: [id number]      MD NOTES:  10/5 ID CONSULTED  Recommendation  - Follow-up Hep C VL  - Continue cefepime 2 g every 8 hours  - [place] vancomycin  - Follow-up cultures and histopath  - Antibiotic regimen to be determined by cultures, an oral regimen may be possible given GN growth (ciprofloxacin), but he may require an IV regimen inpatient if recent IVDU.     10/4 PAIN MANAGEMENT  Assessment/Plan   30 yr old male with:  Acute low back pain secondary to 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  Opioid Use Disorder, on Methadone 145 mg daily, follows with [facility] (rx bottle verified and current. Dr. Patsi Davis), recent relapse with IV cocaine    Multimorbidities: ADHD, Anxiety/depression, hx of PE/COVID infection fall 2021       - no changes in his methadone dose, QTc interval improving  - increase metaxalone to 800 mg tid  - start celecoxib 200 mg bid tomorrow for acute pain, then taper lower in a few days  - monitor renal function closely with above  - advised patient on current QTc interval, that full effect of methadone dose reduction likely has not fully taken effect given its long half life  - reinforced pain expectations given his tolerance to opioids and other substances.    CONTINUES ON THE MEDICAL TELE  FLOOR   IV ABX   PAIN CONTROL     TELE   VS Q 8 HR           Recent Labs   Lab 07/03/21  0401   WBC 11.7*   Hemoglobin 11.1*   Hematocrit 34.8*   PLT CT 415          Recent Labs   Lab 07/03/21  0401   Sodium 138   Potassium 3.5   Chloride 106   CO2 21   BUN 12   Creatinine 0.67*   EGFR 129   Glucose 76   Calcium 9.5          VITALS: Blood pressure 113/76, pulse 64, temperature 98.6 F (37 C), temperature source Temporal, resp. rate 17, height 1.803 m (5\' 11" ), weight 86.2 kg (190 lb), SpO2 93 %.      Scheduled Meds:  Current Facility-Administered Medications   Medication Dose Route Frequency    atorvastatin  40 mg Oral  Daily    buPROPion SR  150 mg Oral Daily    busPIRone  15 mg Oral Q12H SCH    cefepime  2 g Intravenous Q12H    celecoxib  200 mg Oral BID    docusate sodium  200 mg Oral BID    enoxaparin  40 mg Subcutaneous Q24H    famotidine  20 mg Oral BID AC    FLUoxetine  10 mg Oral QAM    lactobacillus species  50 Billion CFU Oral Daily    lidocaine  1 patch Transdermal Q24H    metaxalone  800 mg Oral TID    methadone  35 mg Oral Q8H SCH    polyethylene glycol  17 g Oral Daily    pregabalin  50 mg Oral TID    sodium chloride (PF)  3 mL Intravenous Q12H SCH     Continuous Infusions:  PRN Meds:.acetaminophen **OR** acetaminophen **OR** acetaminophen, cloNIDine, LORazepam, naloxone, oxyCODONE, polyethylene glycol     PRN  PERCOCET 5 MG  1-2 TABS PRN X 3       Clayton Davis Animator, RN  Utilization Management Review Nurse  [facility] [address]  [address]  [place], [place] [address]  [phone] office Phone, Direct and Confidential  [phone], Fax  [email or web address]

## 2021-07-03 NOTE — Progress Notes (Signed)
[facility]  PROGRESS NOTE      Patient: Clayton Davis  Date: 07/03/2021   LOS: 5 Days  Admission Date: 06/28/2021   MRN: [id number]  Attending: Jenness Corner, MD       Please contact me via Epic chat for routine matters and via phone [phone] for urgent matters.    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  I therefore IR procedure aborted.  I was notified by IR PA they are scheduling it for Monday.       ID physician Dr. Luisa Hart communicated with me.  She advised not to use antibiotic as patient is stable.  She is okay for antibiotic to resume over the weekend if patient keeps getting sick and febrile.  The idea behind this is so that they specimen from the aspiration would be high yield to give [place] a more targeted antibiotic therapy.      10/01- wbc normalized since yest . add lidocaine patch . Otherwise plan is the same . Qtc 558  10/2 for IR bx tomorrow.  Hold Advil after noon dose      10/3 IR aspiration done and abx IV resumed .       10/4 Slept better .       SUBJECTIVE     Patient had a pICC line placed and reports shooting pain down to his spine and back pain now is back when he started  Told him that he intermittently miss his abc because of poos IV access, that may explain persistent pain  Wife in the room  He states that he does not want to report his pain because he gets a run around and does not want to speak anymore that he is like begging for pain meds    ASSESSMENT/PLAN     Vertebral osteomyelitis/discitis, GNR secondary to IV drug use  Back pain secondary to above  Follow ID and sensitivity    9/30- Apparently before noon patient managed to eat brown and Frappuccino even though he was made n.p.o..  I therefore IR procedure aborted.  I was notified by IR PA they are scheduling it for Monday.       ID physician Dr. Luisa Hart communicated with me.  She  advised not to use antibiotic as patient is stable.  She is okay for antibiotic to resume over the weekend if patient keeps getting sick and febrile.  The idea behind this is so that they specimen from the aspiration would be high yield to give [place] a more targeted antibiotic therapy.      10/01- wbc normalized since yest . add lidocaine patch . Otherwise plan is the same . Qtc 558  10/2 for IR bx tomorrow.  Hold Advil after noon dose      10/3 for interventional radiology aspiration today.  I spoke to wife in the room and I also explained to her regards to limitations for pain management in regards to QTC prolongation and potential risks  Later  IR aspiration done yest and abx resumed .     10/4 continue iv abx . Await cultures . ID is on board    Opioid dependence  Pain management   -Patient states he is on 145 methadone at home  -appreciated  pain mx   - Methadone 40 Mg tid here then 35  mg tid   - Lidocaine patch  -- Lyrical added   - prn oxycodone       OTC prolangation ( not brand new but needs monitoring )   To mitigate risks--Methadone 145 mg daily  dose reduced and separated to 40 tid  , decreased prozac dose by half  Daily ekg , tele, monitor lytes   QTc 558--> 524--> 510 this am        Anemia  -Hemoglobin 11/39 on admission   -Previous hemoglobin 13/42  -Monitor for signs of bleed  -Iron studies -- suggests sequestration   Prophylactic pepcid  Lab Results   Component Value Date    HGB 11.1 (L) 07/03/2021       Hep C ab positive   Out pt follow up with gi/hepatologist   I updated him on this       Hyperlipidemia  -Continue statin    Mood disorder/anxiety  -Continue Wellbutrin, buspirone, fluoxetine    Constipation   Bowel regimen       Dispo:   He willl Need PT and OT when his pains good enough to be able to participate   Continue current care         DVT Prophylaxis: Resume Lovenox         Code Status: Full Code      MEDICATIONS     Current Facility-Administered Medications   Medication Dose Route Frequency     atorvastatin  40 mg Oral Daily    buPROPion SR  150 mg Oral Daily    busPIRone  15 mg Oral Q12H SCH    cefepime  2 g Intravenous Q12H    celecoxib  200 mg Oral BID    docusate sodium  200 mg Oral BID    enoxaparin  40 mg Subcutaneous Q24H    famotidine  20 mg Oral BID AC    FLUoxetine  10 mg Oral QAM    lactobacillus species  50 Billion CFU Oral Daily    lidocaine  1 patch Transdermal Q24H    metaxalone  800 mg Oral TID    methadone  35 mg Oral Q8H SCH    polyethylene glycol  17 g Oral Daily    pregabalin  50 mg Oral TID    sodium chloride (PF)  10 mL Intracatheter Q12H    sodium chloride (PF)  3 mL Intravenous Q12H SCH       PHYSICAL EXAM     Vitals:    07/03/21 1538   BP: 121/75   Pulse: 74   Resp: 18   Temp: 97.9 F (36.6 C)   SpO2: 95%       Temperature: Temp  Min: 97.9 F (36.6 C)  Max: 98.6 F (37 C)  Pulse: Pulse  Min: 57  Max: 74  Respiratory: Resp  Min: 16  Max: 18  Non-Invasive BP: BP  Min: 113/76  Max: 121/75  Pulse Oximetry SpO2  Min: 93 %  Max: 95 %      Constitutional: Seems to be NAD if doesn't move   HEENT: NC/AT, PERRL,EOMI. Neck supple  Cardiovascular: RRytm, normal S1 S2, no murmurs, gallops.  Respiratory:  CTA bilaterally Normal rate. No retractions or increased work of breathing.  Gastrointestinal: Normal BS, non-distended, soft, non-tender, no rebound or guarding.  Genitourinary: no suprapubic or costovertebral angle tenderness  Musculoskeletal: Lower extremity movement limited by pain   skin: no rashes, jaundice , cyanosis  Neurologic: AAO x3.  Able to move lower  extremities but painful back   Psychiatric: Flat affect . Cooperative.     LABS     Recent Labs   Lab 07/03/21  0401 07/02/21  0359 07/01/21  0403   WBC 11.7* 10.1 11.6*   RBC 4.30 4.43 4.31   Hemoglobin 11.1* 11.1* 10.7*   Hematocrit 34.8* 35.4* 34.8*   MCV 81 80 81   PLT CT 415 438 455*         Recent Labs   Lab 07/03/21  0401 07/02/21  0359 07/01/21  0403 06/30/21  0448 06/29/21  0527 06/28/21  0310   Sodium 138 137 137 140  140 140   Potassium 3.5 3.7 4.1 4.3 4.3 4.4   Chloride 106 103 100 105 102 99   CO2 21 23.1 26 26 26  31*   BUN 12 17 14 13 18  23*   Creatinine 0.67* 0.72* 0.73* 0.67* 0.71* 0.91   Glucose 76 78 90 73 118* 90   Calcium 9.5 9.2 9.6 9.6 9.7 10.2   Magnesium  --   --   --   --  1.9 1.8         Recent Labs   Lab 07/03/21  0401 07/02/21  0359 07/01/21  0403   ALT 48 19 9   AST (SGOT) 56* 23 12   Bilirubin, Total 0.4 0.3 0.4   Albumin 2.7* 2.7* 2.8*   Alkaline Phosphatase 51 36* 37*               Recent Labs   Lab 06/28/21  0804   PT INR 1.2*   PT 12.5*         Microbiology Results (last 15 days)       Procedure Component Value Units Date/Time    Lab Specimen No [[id number]]     Order Status: Sent     Lab Specimen Yes [[id number]] Collected: 07/01/21 1545    Order Status: Completed Specimen: Other Updated: 07/01/21 1634     Lab Specimen Order Notification Lab Specimen Notification     Comment: The above 1 analytes were performed by [facility] Main Lab [phone])  [address],[place] [address]         AFB (Mycobacterium) Culture and Smear [[id number]] Collected: 07/01/21 1545    Order Status: Canceled Specimen: Abscess from Seroma     Multi-Specimen Fungal Culture and Smear [[id number]] Collected: 07/01/21 1545    Order Status: Completed Specimen: Abscess from Spine Updated: 07/02/21 0832     Fungal Smear No Fungal Elements Seen    Abscess Culture and Smear [[id number]]  (Abnormal) Collected: 07/01/21 1545    Order Status: Completed Specimen: Abscess from Spine Updated: 07/03/21 0756     Gram Stain --     No Epithelial Cells  Occasional WBC's Seen  No Organisms Seen       Isolate 1 Scant Growth  Gram Negative Rods  Identification and Sensitivity To Follow      Lab Specimen Yes [[id number]] Collected: 07/01/21 1545    Order Status: Completed Specimen: Other Updated: 07/01/21 1634     Lab Specimen Order Notification Lab Specimen Notification     Comment: The above 1 analytes were performed by [facility] Main Lab [phone])  [address],[place] [address]         Sterile Body Fluid Culture and Smear [[id number]] Collected: 07/01/21 1545    Order Status: Canceled Specimen: Other from Seroma     Lab Specimen Yes [[id number]]  Order Status: Canceled     Abscess Culture and Smear [[id number]]     Order Status: Canceled Specimen: Abscess from Spine     Sterile Body Fluid Culture and Smear [[id number]]     Order Status: Canceled Specimen: Other from Seroma     Blood Culture - Venipuncture [[id number]] Collected: 06/28/21 0717    Order Status: Completed Specimen: Blood from Venipuncture Updated: 07/03/21 0801     Culture Result No Growth in 5 Days    Blood Culture - Venipuncture [[id number]] Collected: 06/28/21 0450    Order Status: Completed Specimen: Blood from Venipuncture Updated: 07/03/21 0801     Culture Result --     Blood culture volume for this collection was less than the optimal needed to acheive adequate sensitivity.  No Growth in 5 Days               RADIOLOGY     PICC Placement Double Lumen- VH    Result Date: 07/03/2021  IMPRESSION: Successful fluoroscopic and ultrasound guided PICC line placement. ReadingStation:[id number]     HH needs if any:  There are no questions and answers to display.       Nutrition assessment (if any) in collaboration with medical nutritionist :            Signed,  Jenness Corner, MD  5:03 PM 07/03/2021

## 2021-07-03 NOTE — Progress Notes (Signed)
[facility] PAIN MANAGEMENT PROGRESS NOTE    Subjective   Patient reports that he was able to sleep last night, and his pain is about the same at his lower  back radiating to his hips (locking  up). Also reports intermittent spasms that run up the spine. Had anxiety episode last night but better eventually. Denies chest pain, palpitation, or shortness of breath presently.     Spouse present during visit.    Objectives:     Vitals:    07/03/21 0731   BP: 113/76   Pulse: 64   Resp: 17   Temp: 98.6 F (37 C)   SpO2: 93%     General appearance -bed resting, no grimacing, in no distress.  Mental status -alert, oriented to person, place, and time  Abdomen - non-distended, no nausea    Neurological - no tremors, no myoclonus, BLE sensory grossly intact.  Musculoskeletal - lumbosacral spine pain (no changes).  BLE gross motor intact/guarded with movement, but observed pt  able to flex knees on his own without difficulty  Extremities -no cyanosis  Skin -normal coloration, no rashes.  Psych - pleasant, anxious but much engaged in conversation today, forward thinking positive      Labs/Imaging:     Results       Procedure Component Value Units Date/Time    Blood Culture - Venipuncture [[id number]] Collected: 06/28/21 0717    Specimen: Blood from Venipuncture Updated: 07/03/21 0801     Culture Result No Growth in 5 Days    Blood Culture - Venipuncture [[id number]] Collected: 06/28/21 0450    Specimen: Blood from Venipuncture Updated: 07/03/21 0801     Culture Result --     Blood culture volume for this collection was less than the optimal needed to acheive adequate sensitivity.  No Growth in 5 Days      Abscess Culture and Smear [[id number]]  (Abnormal) Collected: 07/01/21 1545    Specimen: Abscess from Spine Updated: 07/03/21 0756     Gram Stain --     No Epithelial Cells  Occasional WBC's Seen  No Organisms Seen       Isolate 1 Scant Growth  Gram Negative Rods  Identification and Sensitivity To Follow      Vancomycin AUC Level  [[id number]] Collected: 07/03/21 0401    Specimen: Plasma Updated: 07/03/21 0517     Vancomycin AUC 22.2 mcg/mL     Comprehensive metabolic panel [[id number]]  (Abnormal) Collected: 07/03/21 0401    Specimen: Plasma Updated: 07/03/21 0517     Sodium 138 mMol/L      Potassium 3.5 mMol/L      Chloride 106 mMol/L      CO2 21 mMol/L      Calcium 9.5 mg/dL      Glucose 76 mg/dL      Creatinine 2.44 mg/dL      BUN 12 mg/dL      Protein, Total 7.0 gm/dL      Albumin 2.7 gm/dL      Alkaline Phosphatase 51 U/L      ALT 48 U/L      AST (SGOT) 56 U/L      Bilirubin, Total 0.4 mg/dL      Albumin/Globulin Ratio 0.63 Ratio      Anion Gap 14.5 mMol/L      BUN / Creatinine Ratio 17.9 Ratio      EGFR 129 mL/min/1.26m2      Osmolality Calculated 274 mOsm/kg      Globulin 4.3 gm/dL  CBC and differential [[id number]]  (Abnormal) Collected: 07/03/21 0401    Specimen: Blood Updated: 07/03/21 0514     WBC 11.7 K/cmm      RBC 4.30 M/cmm      Hemoglobin 11.1 gm/dL      Hematocrit 40.9 %      MCV 81 fL      MCH 26 pg      MCHC 32 gm/dL      RDW 81.1 %      PLT CT 415 K/cmm      MPV 7.2 fL      Neutrophils % 70.2 %      Lymphocytes 17.4 %      Monocytes 9.9 %      Eosinophils % 1.9 %      Basophils % 0.5 %      Neutrophils Absolute 8.2 K/cmm      Lymphocytes Absolute 2.0 K/cmm      Monocytes Absolute 1.2 K/cmm      Eosinophils Absolute 0.2 K/cmm      Basophils Absolute 0.1 K/cmm     Hepatitis C Viral RNA, Quant, PT-PCR w/Rflx to GT [[id number]] Collected: 07/02/21 1015    Specimen: Serum Updated: 07/02/21 1616           QTc - prelim report at 20     Assessment/Plan   30 yr old male with:  Acute low back pain secondary to 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  Opioid Use Disorder, on Methadone 145 mg daily, follows with [facility] (rx bottle verified and current. Dr. Patsi Sears), recent relapse with IV cocaine    Multimorbidities: ADHD, Anxiety/depression, hx of PE/COVID infection fall 2021      - continue current dose of  methadone, QTC interval continues to improve. Patient again expressed desire to wean off of this eventually.  - D/c apap scheduled, use PRN for pain, AST at 56 today  - continue to monitor QTc interval  - increase pregabalin to 50 mg tid, for pain and also helps with anxiety  - discussed relaxation and meditation as nonpharm strategies to reduce his pain and anxiety  - advised on pain expectations and longterm sequelae of high dose chronic opioid exposure.    Cleatis Polka DNP  07/03/2021

## 2021-07-03 NOTE — Teleconsult (Signed)
Infectious Disease Live Tele-ID Progress Note [facility]  [employer]   Patient Name: Clayton Davis   Attending Physician: Jenness Corner, MD   Primary Care Physician: Marisa Sprinkles, MD     Visit information:  Patient seen on 07/03/21    Subjective  This consult was provided via telemedicine using two-way real-time interactive telecommunication technology between the patient and the provider. The Adult nurse includes audio and video. Patient has been seen through the Telemedicine service with the assistance of a local tele-presenter.    Operative cultures grew GNRs yesterday, still awaiting ID  AF and VSS  Mild AST elevation to 56. Awaiting Hep C VL.  He feels like his hip is locking up and shooting through his spine, happened last night.   Wife is present in the room so interview limited and Hep C Ab status not addressed as patient would prefer his .   Having significant pain the low back and Bl hips, R>L.     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 IR guided biopsy done 10/3 with cultures and path pending. Now on empiric vancomycin and cefepime. Hep C Ab positive, VL is pending.    His exam is difficult, but it seems that his strength in the LE is limited by pain and not weakness and he has full ROM of both hips without pain so I doubt that he has developed any septic joints. If pain progresses I would recommend a low threshold for repeat MRI to ensure that he has not developed any drainable fluid collections. Discussed that he will need 6 weeks of antibiotics with him today and that a pill may not be possible.      #L4-L5 discitis/osteomyelitis, intramuscular involvement of medial Bl psoas muscles s/p biopsy with growth of GNRs  #SUD on methadone, recent IVDU  #Hep C Ab reactive     Recommendation  - Follow-up Hep C  VL  - Continue cefepime 2 g every 8 hours  - Zwingle vancomycin  - Follow-up cultures and histopath  - Antibiotic regimen to be determined by cultures, an oral regimen may be possible given GN growth (ciprofloxacin), but he may require an IV regimen inpatient if recent IVDU.       ID will follow.   Maeola Sarah, MD  Clinical Assistant Professor  [facility]  Division of Infectious Diseases  [employer] [facility]  [employer] [facility] Call Center: [phone]  Pager: Tele ID 7 Service  Pager: [phone] (Personal)       Microbiology:  9/30 blood x2 NGTD  10/3 aspiration cultures GS negative, growth of GNRs  Fungal and AFB smears negative, pending     Inpatient Medications    Antibiotic Medications:  Antibiotics (From admission, onward)       Start        07/02/21 0800  vancomycin (VANCOCIN) 1,500 mg in sodium chloride 0.9 % 250 mL IVPB (vial-mate)  Every 12 hours        End Date/Time: Until Discontinued    Comments:            07/01/21 1854  vancomycin therapy placeholder  See admin instructions        End Date/Time: Until Discontinued    Comments:            07/01/21 1830  cefepime (MAXIPIME) 2 g in sodium chloride 0.9 % 100 mL IVPB mini-bag plus  Every 12 hours        End Date/Time: Until Discontinued    Comments:                            Objective   Vitals: T:98.6 F (37 C) (Temporal),  BP:113/76, HR:64, RR:17, SaO2:93%,FiO2-O2 (L/m): , Dosing Wt:  Wt Readings from Last 1 Encounters:   06/28/21 86.2 kg (190 lb)   ,  KGM:WNUU mass index is 26.5 kg/m.    Constitutional: Resting in no distress.  Respiratory: Normal work of breathing  Gastrointestinal: Soft, non-tender, non-distended  Musculoskeletal: Normal ROM of arms and legs without obvious joint effusions. No pain with ROM of BL hips. Refused to roll over due to pain so back not examined.   Neurological: Alert and oriented, 4/5 leg raise BL and strength limited by pain. Normal ankle strength.   Psychiatric: Normal affect and behavior  Skin: No rashes noted over exposed  skin, but exam limited by lack of small camera this morning.     Results Review    Labs:  Estimated Creatinine Clearance: 171.7 mL/min (A) (based on SCr of 0.67 mg/dL (L)).  Recent Labs   Lab 07/03/21  0401 07/02/21  0359   WBC 11.7* 10.1   RBC 4.30 4.43   Hemoglobin 11.1* 11.1*   Hematocrit 34.8* 35.4*   MCV 81 80   PLT CT 415 438     Recent Labs   Lab 06/28/21  0804   PT 12.5*   PT INR 1.2*         Lab Results   Component Value Date    HGBA1CPERCNT 5.3 01/23/2021     Recent Labs   Lab 07/03/21  0401 07/02/21  0359 07/01/21  0403   Glucose 76 78 90   Sodium 138 137 137   Potassium 3.5 3.7 4.1   Chloride 106 103 100   CO2 21 23.1 26   BUN 12 17 14    Creatinine 0.67* 0.72* 0.73*   EGFR 129 126 126   Calcium 9.5 9.2 9.6     Recent Labs   Lab 07/03/21  0401 07/02/21  0359 06/30/21  0448 06/29/21  0527 06/28/21  0310   Magnesium  --   --   --  1.9 1.8   Phosphorus  --   --   --  5.0*  --    Albumin 2.7* 2.7*  More results in Results Review 2.8* 3.3*   Protein, Total 7.0 7.3  More results in Results Review 7.4 8.7*   Bilirubin, Total 0.4 0.3  More results in Results Review 0.3 0.3   Alkaline Phosphatase 51 36*  More results in Results Review 37* 43   ALT 48 19  More results in Results Review 13 16   AST (SGOT) 56* 23  More results in Results Review 13 18   More results in Results Review = values in this interval not displayed.     Recent Labs   Lab 06/28/21  0455   Urine Specific Gravity 1.017   pH, Urine 5.6       MRI Lumbar Spine W WO Contrast    Result Date: 06/28/2021  IMPRESSION: Osteomyelitis-discitis at L4-5 is confirmed. No abscess  ReadingStation:[email or web address]    CT Abdomen Pelvis with IV Cont    Result Date: 06/28/2021  No mass lesion identified. Osteomyelitis/discitis at  L4-5. Associated circumferential disc bulge is worse. Subtle stranding in the subcutaneous fat over right buttock is new may suggest contusion  ReadingStation:[email or web address]    CT Guided Asp Disc or Paravertebral    Result Date:  07/02/2021  IMPRESSION: Successful percutaneous CT guided L4-L5 Disc Core Biopsy and Aspiration with moderate sedation. ReadingStation:[email or web address]     Baldo Daub, MD  07/03/21 9:25 AM  MRN: [id number]                                     CSN: [id number]     Total time (face-to-face and non-face-to-face) spent on today's visit was 30 minutes. This included preparation for the visit (i.e. reviewing test results), performance of a medically appropriate history and examination, and orders for medications, tests or other procedures. This time is exclusive of procedures performed and time spent teaching.

## 2021-07-03 NOTE — Plan of Care (Addendum)
NURSE NOTE SUMMARY  [facility] [address]   Patient Name: Clayton Davis   Attending Physician: Clabe Seal, MD   Today's date:   07/03/2021 LOS: 5 days   Shift Summary:                                                              Assumed pt care. Assessment and vitals complete.   NVS intact  RR nurse placed LUE iv, INT  PRN Roxicodone given for pain relief.   Unable to administer 0630 dose of cefepime. Pt reporting stinging/pain sensation during flush. Due to limited IV access per vascular access, unable to attempt for new IV. Will notify Vascular Access.    Pt resting in bed, call bell within reach, family at bedside.     Report given to oncoming nurse.     Provider Notifications:        Rapid Response Notifications:  Mobility:      PMP Activity: Step 6 - Walks in Room (07/02/2021  8:53 PM)     Weight tracking:  Family Dynamic:   No data found.          Last Bowel Movement   No data recorded        Problem: Compromised Hemodynamic Status  Goal: Vital signs and fluid balance maintained/improved  Outcome: Progressing  Flowsheets (Taken 06/30/2021 0213 by Ferdie Ping, RN)  Vital signs and fluid balance are maintained/improved:   Position patient for maximum circulation/cardiac output   Monitor/assess vitals and hemodynamic parameters with position changes   Monitor/assess lab values and report abnormal values     Problem: Impaired Mobility  Goal: Mobility/Activity is maintained at optimal level for patient  Outcome: Progressing  Flowsheets (Taken 06/30/2021 0213 by Ferdie Ping, RN)  Mobility/activity is maintained at optimal level for patient:   Increase mobility as tolerated/progressive mobility   Maintain proper body alignment   Plan activities to conserve energy, plan rest periods   Reposition patient every 2 hours and as needed unless able to reposition self   Assess for changes in respiratory status, level of consciousness and/or development of fatigue     Problem: Peripheral  Neurovascular Impairment  Goal: Extremity color, movement, sensation are maintained or improved  Outcome: Progressing  Flowsheets (Taken 06/30/2021 0213 by Ferdie Ping, RN)  Extremity color, movement, sensation are maintained or improved:   Increase mobility as tolerated/progressive mobility   Assess extremity for proper alignment   Teach/review/reinforce ankle pump exercises     Problem: Compromised skin integrity  Goal: Skin integrity is maintained or improved  Outcome: Progressing  Flowsheets (Taken 06/30/2021 0213 by Ferdie Ping, RN)  Skin integrity is maintained or improved:   Assess Braden Scale every shift   Turn or reposition patient every 2 hours or as needed unless able to reposition self   Avoid shearing   Keep skin clean and dry   Monitor patient's hygiene practices   Keep head of bed 30 degrees or less (unless contraindicated)     Problem: Pain interferes with ability to perform ADL  Goal: Pain at adequate level as identified by patient  Outcome: Progressing  Flowsheets (Taken 06/30/2021 0213 by Ferdie Ping, RN)  Pain at adequate level as identified by patient:  Assess pain on admission, during daily assessment and/or before any "as needed" intervention(s)   Reassess pain within 30-60 minutes of any procedure/intervention, per Pain Assessment, Intervention, Reassessment (AIR) Cycle   Evaluate if patient comfort function goal is met   Evaluate patient's satisfaction with pain management progress   Offer non-pharmacological pain management interventions     Problem: Side Effects from Pain Analgesia  Goal: Patient will experience minimal side effects of analgesic therapy  Outcome: Progressing  Flowsheets (Taken 06/30/2021 0213 by Ferdie Ping, RN)  Patient will experience minimal side effects of analgesic therapy:   Assess for changes in cognitive function   Prevent/manage side effects per LIP orders (i.e. nausea, vomiting, pruritus, constipation, urinary retention, etc.)   Evaluate for  opioid-induced sedation with appropriate assessment tool (i.e. POSS)     Problem: Moderate/High Fall Risk Score >5  Goal: Patient will remain free of falls  Outcome: Progressing  Flowsheets  Taken 07/02/2021 2053 by Fransico Michael, RN  VH Moderate Risk (6-13):   ALL REQUIRED LOW INTERVENTIONS   YELLOW NON-SKID SLIPPERS   INITIATE YELLOW "FALL RISK" SIGNAGE   YELLOW "FALL RISK" ARM BAND   USE OF BED EXIT ALARM IF PATIENT IS CONFUSED OR IMPULSIVE. PLACE RESET BED ALARM SIGN ABOVE BED   PLACE FALL RISK LEVEL ON WHITE BOARD FOR COMMUNICATION PURPOSES IN PATIENT'S ROOM  Taken 06/30/2021 0213 by Ferdie Ping, RN  Moderate Risk (6-13):   LOW-Fall Interventions Appropriate for Low Fall Risk   MOD-Floor mat at bedside (where available) if appropriate   MOD-Remain with patient during toileting   MOD-Place bedside commode and assistive devices out of sight when not in use   MOD-Apply bed exit alarm if patient is confused   MOD-Request PT/OT consult order for patients with gait/mobility impairment     Problem: Compromised Tissue integrity  Goal: Damaged tissue is healing and protected  Outcome: Progressing  Goal: Nutritional status is improving  Outcome: Progressing

## 2021-07-03 NOTE — Plan of Care (Signed)
Problem: Compromised Hemodynamic Status  Goal: Vital signs and fluid balance maintained/improved  Outcome: Progressing  Flowsheets (Taken 07/03/2021 1538)  Vital signs and fluid balance are maintained/improved: Monitor/assess lab values and report abnormal values     Problem: Impaired Mobility  Goal: Mobility/Activity is maintained at optimal level for patient  Outcome: Progressing  Flowsheets (Taken 07/03/2021 1538)  Mobility/activity is maintained at optimal level for patient:   Increase mobility as tolerated/progressive mobility   Maintain proper body alignment   Encourage independent activity per ability   Plan activities to conserve energy, plan rest periods   Reposition patient every 2 hours and as needed unless able to reposition self   Assess for changes in respiratory status, level of consciousness and/or development of fatigue   Consult/collaborate with Physical Therapy and/or Occupational Therapy     Problem: Peripheral Neurovascular Impairment  Goal: Extremity color, movement, sensation are maintained or improved  Outcome: Progressing  Flowsheets (Taken 07/03/2021 1538)  Extremity color, movement, sensation are maintained or improved:   Increase mobility as tolerated/progressive mobility   Assess and monitor application of corrective devices (cast, brace, splint), check skin integrity     Problem: Compromised skin integrity  Goal: Skin integrity is maintained or improved  Outcome: Progressing  Flowsheets (Taken 07/03/2021 1538)  Skin integrity is maintained or improved:   Assess Braden Scale every shift   Turn or reposition patient every 2 hours or as needed unless able to reposition self   Increase activity as tolerated/progressive mobility   Keep skin clean and dry   Encourage use of lotion/moisturizer on skin   Monitor patient's hygiene practices     Problem: Pain interferes with ability to perform ADL  Goal: Pain at adequate level as identified by patient  Outcome: Progressing  Flowsheets (Taken  07/03/2021 1538)  Pain at adequate level as identified by patient:   Identify patient comfort function goal   Reassess pain within 30-60 minutes of any procedure/intervention, per Pain Assessment, Intervention, Reassessment (AIR) Cycle   Evaluate if patient comfort function goal is met   Consult/collaborate with Physical Therapy, Occupational Therapy, and/or Speech Therapy   Include patient/patient care companion in decisions related to pain management as needed   Evaluate patient's satisfaction with pain management progress   Offer non-pharmacological pain management interventions     Problem: Side Effects from Pain Analgesia  Goal: Patient will experience minimal side effects of analgesic therapy  Outcome: Progressing     Problem: Moderate/High Fall Risk Score >5  Goal: Patient will remain free of falls  Outcome: Progressing  Flowsheets (Taken 07/03/2021 1536)  VH Moderate Risk (6-13):   ALL REQUIRED LOW INTERVENTIONS   INITIATE YELLOW "FALL RISK" SIGNAGE   YELLOW NON-SKID SLIPPERS   YELLOW "FALL RISK" ARM BAND   USE OF BED EXIT ALARM IF PATIENT IS CONFUSED OR IMPULSIVE. PLACE RESET BED ALARM SIGN ABOVE BED   Request PT/OT therapy consult order from Physician for patients with gait/mobility impairment   Use assistive devices     Problem: Compromised Tissue integrity  Goal: Damaged tissue is healing and protected  Outcome: Progressing  Flowsheets (Taken 07/03/2021 1538)  Damaged tissue is healing and protected:   Monitor/assess Braden scale every shift   Provide wound care per wound care algorithm   Reposition patient every 2 hours and as needed unless able to reposition self   Keep intact skin clean and dry   Encourage use of lotion/moisturizer on skin   Monitor patient's hygiene practices  Goal: Nutritional status  is improving  Outcome: Progressing  Flowsheets (Taken 07/03/2021 1538)  Nutritional status is improving:   Allow adequate time for meals   Include patient/patient care companion in decisions related to  nutrition

## 2021-07-04 DIAGNOSIS — M4646 Discitis, unspecified, lumbar region: Secondary | ICD-10-CM

## 2021-07-04 DIAGNOSIS — F1910 Other psychoactive substance abuse, uncomplicated: Secondary | ICD-10-CM

## 2021-07-04 DIAGNOSIS — M4626 Osteomyelitis of vertebra, lumbar region: Principal | ICD-10-CM

## 2021-07-04 LAB — CBC AND DIFFERENTIAL
Basophils %: 0.5 % (ref 0.0–3.0)
Basophils Absolute: 0.1 10*3/uL (ref 0.0–0.3)
Eosinophils %: 2.2 % (ref 0.0–7.0)
Eosinophils Absolute: 0.3 10*3/uL (ref 0.0–0.8)
Hematocrit: 33.6 % — ABNORMAL LOW (ref 39.0–52.5)
Hemoglobin: 10.8 gm/dL — ABNORMAL LOW (ref 13.0–17.5)
Lymphocytes Absolute: 2.9 10*3/uL (ref 0.6–5.1)
Lymphocytes: 25.2 % (ref 15.0–46.0)
MCH: 26 pg — ABNORMAL LOW (ref 28–35)
MCHC: 32 gm/dL (ref 32–36)
MCV: 81 fL (ref 80–100)
MPV: 7.3 fL (ref 6.0–10.0)
Monocytes Absolute: 1.1 10*3/uL (ref 0.1–1.7)
Monocytes: 9.9 % (ref 3.0–15.0)
Neutrophils %: 62.2 % (ref 42.0–78.0)
Neutrophils Absolute: 7.1 10*3/uL (ref 1.7–8.6)
PLT CT: 416 10*3/uL (ref 130–440)
RBC: 4.17 10*6/uL (ref 4.00–5.70)
RDW: 12.2 % (ref 11.0–14.0)
WBC: 11.4 10*3/uL — ABNORMAL HIGH (ref 4.0–11.0)

## 2021-07-04 LAB — COMPREHENSIVE METABOLIC PANEL
ALT: 49 U/L (ref 0–55)
AST (SGOT): 49 U/L — ABNORMAL HIGH (ref 10–42)
Albumin/Globulin Ratio: 0.63 Ratio — ABNORMAL LOW (ref 0.80–2.00)
Albumin: 2.7 gm/dL — ABNORMAL LOW (ref 3.5–5.0)
Alkaline Phosphatase: 57 U/L (ref 40–145)
Anion Gap: 15.7 mMol/L (ref 7.0–18.0)
BUN / Creatinine Ratio: 20.3 Ratio (ref 10.0–30.0)
BUN: 13 mg/dL (ref 7–22)
Bilirubin, Total: 0.4 mg/dL (ref 0.1–1.2)
CO2: 23 mMol/L (ref 20–30)
Calcium: 9.4 mg/dL (ref 8.5–10.5)
Chloride: 103 mMol/L (ref 98–110)
Creatinine: 0.64 mg/dL — ABNORMAL LOW (ref 0.80–1.30)
EGFR: 131 mL/min/{1.73_m2} (ref 60–150)
Globulin: 4.3 gm/dL — ABNORMAL HIGH (ref 2.0–4.0)
Glucose: 81 mg/dL (ref 71–99)
Osmolality Calculated: 275 mOsm/kg (ref 275–300)
Potassium: 3.7 mMol/L (ref 3.5–5.3)
Protein, Total: 7 gm/dL (ref 6.0–8.3)
Sodium: 138 mMol/L (ref 136–147)

## 2021-07-04 LAB — VH CULTURE AND SMEAR ABSCESS

## 2021-07-04 LAB — VH HEPATITIS C VIRAL RNA, QUANT, PT-PCR W RFLX TO GT
HCV RNA PCR Quantitative: <1.18 log IU/mL
HCV RNA Quantitative Real Time PCR: <15 IU/mL

## 2021-07-04 MED ORDER — SODIUM CHLORIDE 0.9 % IV MBP
2.0000 g | Freq: Three times a day (TID) | INTRAVENOUS | Status: DC
Start: 2021-07-04 — End: 2021-07-08
  Administered 2021-07-04 – 2021-07-08 (×12): 2 g via INTRAVENOUS
  Filled 2021-07-04 (×12): qty 2

## 2021-07-04 NOTE — Teleconsult (Signed)
Infectious Disease E-Consult Tele-ID Progress Note - [facility]  [employer]   Patient Name: Clayton Davis   Attending Physician: Jenness Corner, MD   Primary Care Physician: Pcp, None, MD     Visit Information:   Patient was not seen.    Subjective  "This patient recommendation is based on a telemedicine consult request which was completed asynchronously through chart review and information provided by the primary physician. The patient was not seen or examined today. 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 IR guided biopsy done 10/3 with cultures with Pseudomonas aeruginosa.      If back pain progresses I would recommend a low threshold for repeat MRI to ensure that he has not developed any drainable fluid collections.   Oral ciprofloxacin is an option, but Pseudomonas does have a low barrier to resistance and he came in with Qtc prolongation. He will be on methadone ongoing so this is certainly a concern in the outpatient setting.   I would favor at least 1 week of cefepime before transitioning to ciprofloxacin with inpatient monitoring for 1-2 days to ensure Qtc is not prolonged.   He does not have hepatitis C infection, but was exposed previously.      #L4-L5 discitis/osteomyelitis, intramuscular involvement of medial Bl psoas muscles s/p biopsy with growth of GNRs  #SUD on methadone, recent IVDU  #Hep C Ab reactive, VL not detected     Recommendation  - Continue cefepime 2 g every 8 hours  until 10/10 at which time he can transition to ciprofloxacin 750 mg BID if Qtc wnl with close Qtc monitoring prior to any discharge.   - Follow-up cultures and histopath  - Low threshold to repeat MRI L spine if any weakness develops or pain progresses.       ID will follow.   Maeola Sarah, MD  Clinical Assistant Professor  [facility]  Division of Infectious Diseases  [facility]  [facility] Call Center: [phone]  Pager: Tele ID 7 Service  Pager: [phone] (Personal)       Microbiology:  9/30 blood x2 NGTD  10/3 aspiration cultures GS negative, growth of Pseudomonas aeruginosa  Pseudomonas aeruginosa      BACTERIAL SUSCEPTIBILITY MIC (MCG/ML)    Cefepime 4  Susceptible    Ciprofloxacin <=0.25  Susceptible    Gentamicin <=1  Susceptible    Imipenem 2  Susceptible    Levofloxacin 2  Intermediate    Pip/Tazo 16  Susceptible    Tobramycin <=1  Susceptible      Fungal and AFB smears negative, pending    Inpatient Medications      Objective   Vitals: T:97.5 F (36.4 C) (Temporal),  BP:118/71, HR:62, RR:16, SaO2:92%,FiO2-O2 (L/m): , Dosing Wt:  Wt Readings from Last 1 Encounters:   06/28/21 86.2 kg (190 lb)   ,   GNF:AOZH mass index is 26.5 kg/m.    Patient was not seen or examined.  Results Review    Labs:  Estimated Creatinine Clearance: 179.8 mL/min (A) (based on SCr of 0.64 mg/dL (L)).  Recent Labs   Lab 07/04/21  0331 07/03/21  0401   WBC 11.4* 11.7*   RBC 4.17 4.30   Hemoglobin 10.8* 11.1*   Hematocrit 33.6* 34.8*   MCV 81 81   PLT CT 416 415     Recent Labs   Lab 06/28/21  0804   PT 12.5*   PT INR 1.2*         Lab Results   Component Value Date    HGBA1CPERCNT 5.3 01/23/2021     Recent Labs   Lab 07/04/21  0331 07/03/21  0401 07/02/21  0359   Glucose 81 76 78   Sodium 138 138 137   Potassium 3.7 3.5 3.7   Chloride 103 106 103   CO2 23 21 23.1   BUN 13 12 17    Creatinine 0.64* 0.67* 0.72*   EGFR 131 129 126   Calcium 9.4 9.5 9.2     Recent Labs   Lab 07/04/21  0331 07/03/21  0401  06/30/21  0448 06/29/21  0527 06/28/21  0310   Magnesium  --   --   --  1.9 1.8   Phosphorus  --   --   --  5.0*  --    Albumin 2.7* 2.7*  More results in Results Review 2.8* 3.3*   Protein, Total 7.0 7.0  More results in Results Review 7.4 8.7*   Bilirubin, Total 0.4 0.4  More results in Results Review 0.3 0.3   Alkaline Phosphatase 57 51  More results in Results Review 37* 43   ALT 49 48  More results in Results Review 13 16   AST (SGOT) 49* 56*  More results in Results Review 13 18   More results in Results Review = values in this interval not displayed.     Recent Labs   Lab 06/28/21  0455   Urine Specific Gravity 1.017   pH, Urine 5.6       MRI Lumbar Spine W WO Contrast    Result Date: 06/28/2021  IMPRESSION: Osteomyelitis-discitis at L4-5 is confirmed. No abscess  ReadingStation:[email or web address]    CT Abdomen Pelvis with IV Cont    Result Date: 06/28/2021  No mass lesion identified. Osteomyelitis/discitis at L4-5. Associated circumferential disc bulge is worse. Subtle stranding in the subcutaneous fat over right buttock is new may suggest contusion  ReadingStation:[email or web address]    PICC Placement Double Lumen- VH    Result Date: 07/03/2021  IMPRESSION: Successful fluoroscopic and ultrasound guided PICC line placement. ReadingStation:[email or web address]    CT Guided Asp Disc or Paravertebral    Result Date: 07/02/2021  IMPRESSION: Successful percutaneous CT guided L4-L5 Disc Core Biopsy and Aspiration with moderate sedation. ReadingStation:[email or web address]   Baldo Daub, MD  07/04/21 1:43 PM  MRN: [id number]                                     CSN: [id number]     I spent 15 minutes on chart review and documentation.

## 2021-07-04 NOTE — Progress Notes (Signed)
[facility] PAIN MANAGEMENT PROGRESS NOTE    Subjective   Patient reports severe episode of pain with PICC line placement, stating "it hit a nerve and it traveled by spine and hips, I couldn't move." He has somewhat recovered from the trauma but endorses further frustration about documentation about his procedure. He denies chest pain, shortness of breath, or palpitations.     Last BM was Sunday, on colace and laxatives.    Objectives:     Vitals:    07/04/21 0721   BP: 118/71   Pulse: 62   Resp:    Temp: 97.5 F (36.4 C)   SpO2: 92%     General appearance - bed resting, eyes closed/awakens easily, noted period of obstruction/snoring when sleeping  Mental status -oriented to person, place, and time  CV - RRR, no murmurs, no gallops  Chest - clear, no cyanosis  Abdomen - soft, mildly distended, BS hyperactive  Neurological - no tremors, no myoclonus, BLE sensory grossly intact.  Musculoskeletal - +low back pain/radiating to hips/RLE, full motor function intact but guarded    Extremities -no pedal edema, cap refill brisk  Skin -pale coloration, no rashes.  Psych - anxious, easily irritable, good eye contact      Labs/Imaging:  Recent Labs     07/04/21  0331   WBC 11.4*   Hemoglobin 10.8*   Hematocrit 33.6*     No results for input(s): PT, INR, PTT in the last 24 hours.  Recent Labs     10 /06/22  0331   BUN 13   Creatinine 0.64*   Potassium 3.7   AST (SGOT) 49*   ALT 49     Today's QTc 468 prelim     Assessment/Plan   30 yr old male with:  Acute low back pain secondary to 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  Opioid Use Disorder, on Methadone 145 mg daily, follows with [facility] (rx bottle verified and current. Dr. Patsi Sears), recent relapse with IV cocaine    Multimorbidities: ADHD, Anxiety/depression, hx of PE/COVID infection fall 2021      - continue with current analgesic regimen using MMA approach  - EKG monitoring with high dose methadone, though it has greatly improved   - discussed OSA and CPAP use  while inpatient for safety reasons.  - reinforced pain expectations, positioning in bed, UOOB ad lib, IS use, and bowel regimen.    Cleatis Polka DNP  07/04/2021

## 2021-07-04 NOTE — Plan of Care (Addendum)
NURSE NOTE SUMMARY  [facility] [address]   Patient Name: Clayton Davis   Attending Physician: Jenness Corner, MD   Today's date:   07/04/2021 LOS: 6 days   Shift Summary:                                                              Assumed care of patient at 1900.  Assessment complete.  Call bell within reach  Patient resting comfortably.  Encouraged patient to use incentive spirometer hourly.     Provider Notifications:        Rapid Response Notifications:  Mobility:      PMP Activity: Step 3 - Bed Mobility (07/03/2021 11:00 PM)     Weight tracking:  Family Dynamic:   No data found.          Last Bowel Movement   Last BM Date: 06/30/21       Problem: Impaired Mobility  Goal: Mobility/Activity is maintained at optimal level for patient  Outcome: Progressing  Flowsheets (Taken 07/04/2021 0256)  Mobility/activity is maintained at optimal level for patient:   Increase mobility as tolerated/progressive mobility   Encourage independent activity per ability   Maintain proper body alignment   Perform active/passive ROM   Plan activities to conserve energy, plan rest periods   Reposition patient every 2 hours and as needed unless able to reposition self   Assess for changes in respiratory status, level of consciousness and/or development of fatigue   Consult/collaborate with Physical Therapy and/or Occupational Therapy     Problem: Pain interferes with ability to perform ADL  Goal: Pain at adequate level as identified by patient  Outcome: Progressing  Flowsheets (Taken 07/04/2021 0256)  Pain at adequate level as identified by patient:   Identify patient comfort function goal   Assess for risk of opioid induced respiratory depression, including snoring/sleep apnea. Alert healthcare team of risk factors identified.   Assess pain on admission, during daily assessment and/or before any "as needed" intervention(s)   Reassess pain within 30-60 minutes of any procedure/intervention, per Pain Assessment,  Intervention, Reassessment (AIR) Cycle   Evaluate if patient comfort function goal is met   Evaluate patient's satisfaction with pain management progress   Offer non-pharmacological pain management interventions   Consult/collaborate with Pain Service   Consult/collaborate with Physical Therapy, Occupational Therapy, and/or Speech Therapy   Include patient/patient care companion in decisions related to pain management as needed     Problem: Compromised Tissue integrity  Goal: Damaged tissue is healing and protected  Outcome: Progressing  Flowsheets (Taken 07/04/2021 0256)  Damaged tissue is healing and protected:   Monitor/assess Braden scale every shift   Provide wound care per wound care algorithm   Reposition patient every 2 hours and as needed unless able to reposition self   Increase activity as tolerated/progressive mobility   Relieve pressure to bony prominences for patients at moderate and high risk   Avoid shearing injuries   Keep intact skin clean and dry   Use bath wipes, not soap and water, for daily bathing   Use incontinence wipes for cleaning urine, stool and caustic drainage. Foley care as needed   Monitor external devices/tubes for correct placement to prevent pressure, friction and shearing   Encourage use  of lotion/moisturizer on skin   Monitor patient's hygiene practices

## 2021-07-04 NOTE — UM Notes (Signed)
CONTINUED STAY REVIEW 07/04/2021    AUTH#:      Clayton Davis  1991/04/27  MRN: [id number]      MD NOTES:  10/5  Vertebral osteomyelitis/discitis, GNR secondary to IV drug use  Back pain secondary to above  Follow ID and sensitivity     Opioid dependence  Pain management   -Patient states he is on 145 methadone at home  -appreciated  pain mx   - Methadone 40 Mg tid here then 35 mg tid   - Lidocaine patch  -- Lyrical added   - prn oxycodone      CONTINUES ON THE MEDICAL TELE  FLOOR   IV ABX   PAIN CONTROL     TELE   VS Q 8 HR           Recent Labs   Lab 07/04/21  0331   WBC 11.4*   Hemoglobin 10.8*   Hematocrit 33.6*   PLT CT 416          Recent Labs   Lab 07/04/21  0331   Sodium 138   Potassium 3.7   Chloride 103   CO2 23   BUN 13   Creatinine 0.64*   EGFR 131   Glucose 81   Calcium 9.4          VITALS: Blood pressure 118/71, pulse 62, temperature 97.5 F (36.4 C), temperature source Temporal, resp. rate 16, height 1.803 m (5\' 11" ), weight 86.2 kg (190 lb), SpO2 92 %.      Scheduled Meds:  Current Facility-Administered Medications   Medication Dose Route Frequency    atorvastatin  40 mg Oral Daily    buPROPion SR  150 mg Oral Daily    busPIRone  15 mg Oral Q12H SCH    cefepime  2 g Intravenous Q12H    celecoxib  200 mg Oral BID    docusate sodium  200 mg Oral BID    enoxaparin  40 mg Subcutaneous Q24H    famotidine  20 mg Oral BID AC    FLUoxetine  10 mg Oral QAM    lactobacillus species  50 Billion CFU Oral Daily    lidocaine  1 patch Transdermal Q24H    metaxalone  800 mg Oral TID    methadone  35 mg Oral Q8H SCH    polyethylene glycol  17 g Oral Daily    pregabalin  50 mg Oral TID    sodium chloride (PF)  10 mL Intracatheter Q12H    sodium chloride (PF)  3 mL Intravenous Q12H SCH     Continuous Infusions:  PRN Meds:.acetaminophen **OR** acetaminophen **OR** acetaminophen, cloNIDine, LORazepam, naloxone, oxyCODONE, polyethylene glycol, sodium chloride (PF)     OXYCODONE 10 MG PO PRN X 2       Onica Davidovich Animator,  RN  Utilization Management Review Nurse  [facility] [address]  [address]  [place], [place] [address]  [phone] office Phone, Direct and Confidential  [phone], Fax  [email or web address]

## 2021-07-04 NOTE — Progress Notes (Addendum)
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  I therefore IR procedure aborted.  I was notified by IR PA they are scheduling it for Monday.       ID physician Dr. Luisa Hart communicated with me.  She advised not to use antibiotic as patient is stable.  She is okay for antibiotic to resume over the weekend if patient keeps getting sick and febrile.  The idea behind this is so that they specimen from the aspiration would be high yield to give [place] a more targeted antibiotic therapy.      10/01- wbc normalized since yest . add lidocaine patch . Otherwise plan is the same . Qtc 558  10/2 for IR bx tomorrow.  Hold Advil after noon dose      10/3 IR aspiration done and abx IV resumed .       10/4 Slept better .       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  I therefore IR procedure aborted.  I was notified by IR PA they are scheduling it for Monday.       ID physician Dr. Luisa Hart communicated with me.  She advised not to use antibiotic as patient is stable.  She is okay for antibiotic to resume over the weekend if patient keeps getting sick and febrile.  The idea behind this is so that they specimen from the aspiration would be high yield to give [place] a more targeted antibiotic therapy.      10/01- wbc normalized since yest . add lidocaine patch . Otherwise plan is the same . Qtc 558  10/2 for IR bx tomorrow.  Hold Advil after noon dose       10/3 for interventional radiology aspiration today.  I spoke to wife in the room and I also explained to her regards to limitations for pain management in regards to QTC prolongation and potential risks  Later  IR aspiration done yest and abx resumed .     10/4 continue iv abx . Await cultures . 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 )   To mitigate risks--Methadone 145 mg daily  dose reduced and separated to  40 tid  , decreased prozac dose by half  Daily ekg , tele, monitor lytes   resolving       Anemia  -Hemoglobin 11/39 on admission   -Previous hemoglobin 13/42  -Monitor for signs of bleed  -Iron studies -- suggests sequestration   Prophylactic pepcid  Lab Results   Component Value Date    HGB 10.8 (L) 07/04/2021       Hep C ab positive   Out pt follow up with gi/hepatologist   I updated him on this       Hyperlipidemia  -Continue statin    Mood disorder/anxiety  -Continue Wellbutrin, buspirone, fluoxetine    Constipation   Bowel regimen       Dispo:   As per ID, IV abx until the 10th then transition to cipro po        DVT Prophylaxis: Resume Lovenox         Code Status: Full Code      MEDICATIONS     Current Facility-Administered Medications   Medication Dose Route Frequency    atorvastatin  40 mg Oral Daily    buPROPion SR  150 mg Oral Daily    busPIRone  15 mg Oral Q12H SCH    cefepime  2 g Intravenous Q8H    celecoxib  200 mg Oral BID    docusate sodium  200 mg Oral BID    enoxaparin  40 mg Subcutaneous Q24H    famotidine  20 mg Oral BID AC     FLUoxetine  10 mg Oral QAM    lactobacillus species  50 Billion CFU Oral Daily    lidocaine  1 patch Transdermal Q24H    metaxalone  800 mg Oral TID    methadone  35 mg Oral Q8H SCH    polyethylene glycol  17 g Oral Daily    pregabalin  50 mg Oral TID    sodium chloride (PF)  10 mL Intracatheter Q12H    sodium chloride (PF)  3 mL Intravenous Q12H SCH       PHYSICAL EXAM     Vitals:    07/04/21 1518   BP: 120/75   Pulse: 62   Resp:    Temp: 98.2 F (36.8 C)   SpO2: 96%       Temperature: Temp  Min: 97.2 F (36.2 C)  Max: 98.2 F (36.8 C)  Pulse: Pulse  Min: 62  Max: 65  Respiratory: Resp  Min: 16  Max: 16  Non-Invasive BP: BP  Min: 116/74  Max: 120/75  Pulse Oximetry SpO2  Min: 92 %  Max: 96 %      Constitutional: Seems to be NAD if doesn't move   HEENT: NC/AT, PERRL,EOMI. Neck supple  Cardiovascular: RRytm, normal S1 S2, no murmurs, gallops.  Respiratory:  CTA bilaterally Normal rate. No retractions or increased work of breathing.  Gastrointestinal: Normal BS, non-distended, soft, non-tender, no rebound or guarding.  Genitourinary: no suprapubic or costovertebral angle tenderness  Musculoskeletal: Lower extremity movement limited by pain   skin: no rashes, jaundice , cyanosis  Neurologic: AAO x3.  Able to move lower extremities but painful back   Psychiatric: Flat affect . Cooperative.     LABS     Recent Labs   Lab 07/04/21  0331 07/03/21  0401 07/02/21  0359   WBC 11.4* 11.7* 10.1   RBC 4.17 4.30 4.43   Hemoglobin 10.8* 11.1* 11.1*   Hematocrit 33.6* 34.8* 35.4*   MCV  81 81 80   PLT CT 416 415 438         Recent Labs   Lab 07/04/21  0331 07/03/21  0401 07/02/21  0359 07/01/21  0403 06/30/21  0448 06/29/21  0527 06/28/21  0310   Sodium 138 138 137 137 140 140 140   Potassium 3.7 3.5 3.7 4.1 4.3 4.3 4.4   Chloride 103 106 103 100 105 102 99   CO2 23 21 23.1 26 26 26  31*   BUN 13 12 17 14 13 18  23*   Creatinine 0.64* 0.67* 0.72* 0.73* 0.67* 0.71* 0.91   Glucose 81 76 78 90 73 118* 90   Calcium 9.4 9.5 9.2 9.6 9.6  9.7 10.2   Magnesium  --   --   --   --   --  1.9 1.8         Recent Labs   Lab 07/04/21  0331 07/03/21  0401 07/02/21  0359   ALT 49 48 19   AST (SGOT) 49* 56* 23   Bilirubin, Total 0.4 0.4 0.3   Albumin 2.7* 2.7* 2.7*   Alkaline Phosphatase 57 51 36*               Recent Labs   Lab 06/28/21  0804   PT INR 1.2*   PT 12.5*         Microbiology Results (last 15 days)       Procedure Component Value Units Date/Time    Lab Specimen No [[id number]]     Order Status: Sent     Lab Specimen Yes [[id number]] Collected: 07/01/21 1545    Order Status: Completed Specimen: Other Updated: 07/01/21 1634     Lab Specimen Order Notification Lab Specimen Notification     Comment: The above 1 analytes were performed by [facility] Main Lab [phone])  [address],[place]  [address]         AFB (Mycobacterium) Culture and Smear [[id number]] Collected: 07/01/21 1545    Order Status: Canceled Specimen: Abscess from Seroma     Multi-Specimen Fungal Culture and Smear [[id number]] Collected: 07/01/21 1545    Order Status: Completed Specimen: Abscess from Spine Updated: 07/02/21 0832     Fungal Smear No Fungal Elements Seen    Abscess Culture and Smear [[id number]]  (Abnormal)  (Susceptibility) Collected: 07/01/21 1545    Order Status: Completed Specimen: Abscess from Spine Updated: 07/04/21 0910     Gram Stain --     No Epithelial Cells  Occasional WBC's Seen  No Organisms Seen       Isolate 1 Scant Growth  Pseudomonas aeruginosa      Susceptibility        Pseudomonas aeruginosa      BACTERIAL SUSCEPTIBILITY MIC (MCG/ML)     Cefepime 4  Susceptible     Ciprofloxacin <=0.25  Susceptible     Gentamicin <=1  Susceptible     Imipenem 2  Susceptible     Levofloxacin 2  Intermediate     Pip/Tazo 16  Susceptible     Tobramycin <=1  Susceptible                          Lab Specimen Yes [[id number]] Collected: 07/01/21 1545    Order Status: Completed Specimen: Other Updated: 07/01/21 1634     Lab Specimen Order Notification Lab  Specimen Notification     Comment: The above 1 analytes were performed by [facility] [phone],[place] [address]         Sterile Body Fluid Culture and Smear [[id number]] Collected: 07/01/21 1545    Order Status: Canceled Specimen: Other from Seroma     Lab Specimen Yes [[id number]]     Order Status: Canceled     Abscess Culture and Smear [[id number]]     Order Status: Canceled Specimen: Abscess from Spine     Sterile Body Fluid Culture and Smear [[id number]]     Order Status: Canceled Specimen: Other from Seroma     Blood Culture - Venipuncture [[id number]] Collected: 06/28/21 0717    Order Status: Completed Specimen: Blood from Venipuncture Updated: 07/03/21 0801     Culture Result No Growth in 5 Days    Blood Culture - Venipuncture [[id number]] Collected: 06/28/21 0450    Order Status: Completed Specimen: Blood from Venipuncture Updated: 07/03/21 0801     Culture Result --     Blood culture volume for this collection was less than the optimal needed to acheive adequate sensitivity.  No Growth in 5 Days               RADIOLOGY     No results found.    HH needs if any:  There are no questions and answers to display.       Nutrition assessment (if any) in collaboration with medical nutritionist :            Signed,  Jenness Corner, MD  5:44 PM 07/04/2021

## 2021-07-04 NOTE — Plan of Care (Addendum)
NURSE NOTE SUMMARY  [facility] - [facility] [address]   Patient Name: Clayton Davis   Attending Physician: Jenness Corner, MD   Today's date:   07/04/2021 LOS: 6 days   Shift Summary:                                                              Assumed care of patient at 0700.  Assessment completed. POC reviewed patient verbalizes understanding.  Medicated for pain per doctor order, see MAR.  In bed all shift.      Provider Notifications:        Rapid Response Notifications:  Mobility:      PMP Activity: Step 3 - Bed Mobility (07/04/2021  9:11 AM)     Weight tracking:  Family Dynamic:   No data found.          Last Bowel Movement   Last BM Date: 06/30/21       Problem: Compromised Hemodynamic Status  Goal: Vital signs and fluid balance maintained/improved  Description: Interventions:  1. Position patient for maximum circulation / cardiac output  2. Monitor and assess vitals and hemodynamic parameters with position changes  3. Monitor and compare daily weight  4. Monitor intake and output.  Notify LIP if urine output is less than 30 mL/hour   5. Monitor/assess lab values and report abnormal values  Outcome: Progressing  Flowsheets (Taken 07/03/2021 1538 by Ellie Lunch, RN)  Vital signs and fluid balance are maintained/improved: Monitor/assess lab values and report abnormal values     Problem: Impaired Mobility  Goal: Mobility/Activity is maintained at optimal level for patient  Description: Interventions:  1. Increase mobility as tolerated/progressive mobility  2. Encourage independent activity per ability  3. Maintain proper body alignment  4. Perform active/passive ROM  5. Plan activities to conserve energy, plan rest periods  6. Reposition patient every 2 hours and as needed unless able to reposition self  7. Assess for changes in respiratory status, level of consciousness and/or development of fatigue  8. Consult/collaborate with Physical Therapy and/or Occupational Therapy  Outcome:  Progressing  Flowsheets (Taken 07/04/2021 0256 by Estella Husk, RN)  Mobility/activity is maintained at optimal level for patient:   Increase mobility as tolerated/progressive mobility   Encourage independent activity per ability   Maintain proper body alignment   Perform active/passive ROM   Plan activities to conserve energy, plan rest periods   Reposition patient every 2 hours and as needed unless able to reposition self   Assess for changes in respiratory status, level of consciousness and/or development of fatigue   Consult/collaborate with Physical Therapy and/or Occupational Therapy     Problem: Peripheral Neurovascular Impairment  Goal: Extremity color, movement, sensation are maintained or improved  Description: Interventions:  1. Increase mobility as tolerated/progressive mobility  2. Assess and monitor application of corrective devices (cast, brace, splint); check skin integrity  3. Assess extremity for proper alignment  4. Teach/review/reinforce ankle pump exercises  5. VTE Prevention: Administer anticoagulant(s) and/or apply anti-embolism stockings/devices as ordered  Outcome: Progressing  Flowsheets (Taken 07/03/2021 1538 by Ellie Lunch, RN)  Extremity color, movement, sensation are maintained or improved:   Increase mobility as tolerated/progressive mobility   Assess and monitor application of corrective devices (cast, brace,  splint), check skin integrity     Problem: Compromised skin integrity  Goal: Skin integrity is maintained or improved  Description: Interventions:  1. Assess Braden Scale every shift  2. Turn or reposition patient every 2 hours or as needed unless able to reposition self  3. Increase activity as tolerated/progressive mobility  4. Relieve pressure to bony prominences  5. Avoid shearing   6. Keep skin clean and dry   7. Encourage use of lotion/moisturizer on skin   8. Monitor patient's hygiene practices   9. Collaborate with Wound, Ostomy, and Continence Nurse  10. Utilize  specialty bed  11. Keep head of bed 30 degrees or less (unless contraindicated)  Outcome: Progressing  Flowsheets (Taken 07/03/2021 1538 by Ellie Lunch, RN)  Skin integrity is maintained or improved:   Assess Braden Scale every shift   Turn or reposition patient every 2 hours or as needed unless able to reposition self   Increase activity as tolerated/progressive mobility   Keep skin clean and dry   Encourage use of lotion/moisturizer on skin   Monitor patient's hygiene practices     Problem: Pain interferes with ability to perform ADL  Goal: Pain at adequate level as identified by patient  Description: Interventions:  1. Identify patient comfort function goal  2. Evaluate if patient comfort function goal is met  3. Assess pain on admission, during daily assessment and/or before any "as needed" intervention(s)  4. Reassess pain within 30-60 minutes of any procedure/intervention, per Pain Assessment, Intervention, Reassessment (AIR) Cycle  5. Evaluate patient's satisfaction with pain management progress  6. Offer non-pharmacological pain management interventions  7. Consult/collaborate with Pain Service  8. Consult/collaborate with Physical Therapy, Occupational Therapy, and/or Speech Therapy  9. Assess for risk of opioid induced respiratory depression and side effects, including snoring/sleep apnea. Alert healthcare team of risk factors identified.  10. Include patient/patient care companion in decisions related to pain management as needed  Outcome: Progressing  Flowsheets (Taken 07/04/2021 0256 by Estella Husk, RN)  Pain at adequate level as identified by patient:   Identify patient comfort function goal   Assess for risk of opioid induced respiratory depression, including snoring/sleep apnea. Alert healthcare team of risk factors identified.   Assess pain on admission, during daily assessment and/or before any "as needed" intervention(s)   Reassess pain within 30-60 minutes of any procedure/intervention, per  Pain Assessment, Intervention, Reassessment (AIR) Cycle   Evaluate if patient comfort function goal is met   Evaluate patient's satisfaction with pain management progress   Offer non-pharmacological pain management interventions   Consult/collaborate with Pain Service   Consult/collaborate with Physical Therapy, Occupational Therapy, and/or Speech Therapy   Include patient/patient care companion in decisions related to pain management as needed     Problem: Side Effects from Pain Analgesia  Goal: Patient will experience minimal side effects of analgesic therapy  Description: Interventions:  1. Monitor/assess patient's respiratory status (RR depth, effort, breath sounds)  2. Assess for changes in cognitive function   3. Prevent/manage side effects per LIP orders (i.e. nausea, vomiting, pruritus, constipation, urinary retention, etc.)  4. Evaluate for opioid-induced sedation with appropriate assessment tool (i.e. POSS)  Outcome: Progressing  Flowsheets (Taken 06/30/2021 0213 by Ferdie Ping, RN)  Patient will experience minimal side effects of analgesic therapy:   Assess for changes in cognitive function   Prevent/manage side effects per LIP orders (i.e. nausea, vomiting, pruritus, constipation, urinary retention, etc.)   Evaluate for opioid-induced sedation with appropriate assessment  tool (i.e. POSS)     Problem: Moderate/High Fall Risk Score >5  Description: Fall Risk Score > 5  Goal: Patient will remain free of falls  Outcome: Progressing  Flowsheets  Taken 07/03/2021 2300 by Estella Husk, RN  VH Moderate Risk (6-13):   ALL REQUIRED LOW INTERVENTIONS   INITIATE YELLOW "FALL RISK" SIGNAGE   YELLOW NON-SKID SLIPPERS   YELLOW "FALL RISK" ARM BAND   USE OF BED EXIT ALARM IF PATIENT IS CONFUSED OR IMPULSIVE. PLACE RESET BED ALARM SIGN ABOVE BED   PLACE FALL RISK LEVEL ON WHITE BOARD FOR COMMUNICATION PURPOSES IN PATIENT'S ROOM  Taken 06/30/2021 0213 by Ferdie Ping, RN  Moderate Risk (6-13):   LOW-Fall Interventions  Appropriate for Low Fall Risk   MOD-Floor mat at bedside (where available) if appropriate   MOD-Remain with patient during toileting   MOD-Place bedside commode and assistive devices out of sight when not in use   MOD-Apply bed exit alarm if patient is confused   MOD-Request PT/OT consult order for patients with gait/mobility impairment     Problem: Compromised Tissue integrity  Goal: Damaged tissue is healing and protected  Description: Interventions:  1. Monitor/assess Braden scale every shift  2. Provide wound care per wound care algorithm  3. Reposition patient every 2 hours and as needed unless able to reposition self  4. Increase activity as tolerated/progressive mobility  5. Relieve pressure to bony prominences for patients at moderate and high risk  6. Avoid shearing injuries   7. Keep intact skin clean and dry  8. Use bath wipes, not soap and water, for daily bathing   9. Use incontinence wipes for cleaning urine, stool and caustic drainage; Foley care as needed   10. Monitor external devices/tubes for correct placement to prevent pressure, friction and shearing   11. Encourage use of lotion/moisturizer on skin  12. Monitor patient's hygiene practices  13. Consult/collaborate with wound care nurse   14. Utilize specialty bed  15. Consider placing an indwelling catheter if incontinence interferes with healing of stage 3 or 4 pressure injury  Outcome: Progressing  Flowsheets (Taken 07/04/2021 0256 by Estella Husk, RN)  Damaged tissue is healing and protected:   Monitor/assess Braden scale every shift   Provide wound care per wound care algorithm   Reposition patient every 2 hours and as needed unless able to reposition self   Increase activity as tolerated/progressive mobility   Relieve pressure to bony prominences for patients at moderate and high risk   Avoid shearing injuries   Keep intact skin clean and dry   Use bath wipes, not soap and water, for daily bathing   Use incontinence wipes for cleaning  urine, stool and caustic drainage. Foley care as needed   Monitor external devices/tubes for correct placement to prevent pressure, friction and shearing   Encourage use of lotion/moisturizer on skin   Monitor patient's hygiene practices  Goal: Nutritional status is improving  Description: Interventions:  1. Assist patient with eating   2. Allow adequate time for meals   3. Encourage patient to take dietary supplement(s) as ordered   4. Collaborate with Clinical Nutritionist  5. Include patient/patient care companion in decisions related to nutrition  Outcome: Progressing  Flowsheets (Taken 07/03/2021 1538 by Ellie Lunch, RN)  Nutritional status is improving:   Allow adequate time for meals   Include patient/patient care companion in decisions related to nutrition

## 2021-07-05 LAB — CBC AND DIFFERENTIAL
Basophils %: 0.9 % (ref 0.0–3.0)
Basophils Absolute: 0.1 10*3/uL (ref 0.0–0.3)
Eosinophils %: 3.9 % (ref 0.0–7.0)
Eosinophils Absolute: 0.4 10*3/uL (ref 0.0–0.8)
Hematocrit: 34 % — ABNORMAL LOW (ref 39.0–52.5)
Hemoglobin: 10.8 gm/dL — ABNORMAL LOW (ref 13.0–17.5)
Lymphocytes Absolute: 2.8 10*3/uL (ref 0.6–5.1)
Lymphocytes: 28.8 % (ref 15.0–46.0)
MCH: 26 pg — ABNORMAL LOW (ref 28–35)
MCHC: 32 gm/dL (ref 32–36)
MCV: 81 fL (ref 80–100)
MPV: 7.2 fL (ref 6.0–10.0)
Monocytes Absolute: 0.9 10*3/uL (ref 0.1–1.7)
Monocytes: 9.9 % (ref 3.0–15.0)
Neutrophils %: 56.6 % (ref 42.0–78.0)
Neutrophils Absolute: 5.5 10*3/uL (ref 1.7–8.6)
PLT CT: 442 10*3/uL — ABNORMAL HIGH (ref 130–440)
RBC: 4.21 10*6/uL (ref 4.00–5.70)
RDW: 12.4 % (ref 11.0–14.0)
WBC: 9.6 10*3/uL (ref 4.0–11.0)

## 2021-07-05 LAB — COMPREHENSIVE METABOLIC PANEL
ALT: 51 U/L (ref 0–55)
AST (SGOT): 39 U/L (ref 10–42)
Albumin/Globulin Ratio: 0.64 Ratio — ABNORMAL LOW (ref 0.80–2.00)
Albumin: 2.8 gm/dL — ABNORMAL LOW (ref 3.5–5.0)
Alkaline Phosphatase: 58 U/L (ref 40–145)
Anion Gap: 12.8 mMol/L (ref 7.0–18.0)
BUN / Creatinine Ratio: 21.7 Ratio (ref 10.0–30.0)
BUN: 13 mg/dL (ref 7–22)
Bilirubin, Total: 0.3 mg/dL (ref 0.1–1.2)
CO2: 26 mMol/L (ref 20–30)
Calcium: 9.5 mg/dL (ref 8.5–10.5)
Chloride: 104 mMol/L (ref 98–110)
Creatinine: 0.6 mg/dL — ABNORMAL LOW (ref 0.80–1.30)
EGFR: 133 mL/min/{1.73_m2} (ref 60–150)
Globulin: 4.4 gm/dL — ABNORMAL HIGH (ref 2.0–4.0)
Glucose: 105 mg/dL — ABNORMAL HIGH (ref 71–99)
Osmolality Calculated: 278 mOsm/kg (ref 275–300)
Potassium: 3.8 mMol/L (ref 3.5–5.3)
Protein, Total: 7.2 gm/dL (ref 6.0–8.3)
Sodium: 139 mMol/L (ref 136–147)

## 2021-07-05 LAB — ECG 12-LEAD
P Wave Axis: 44 deg
P-R Interval: 149 ms
Patient Age: 29 years
Q-T Interval(Corrected): 468 ms
Q-T Interval: 480 ms
QRS Axis: 34 deg
QRS Duration: 95 ms
T Axis: 27 years
Ventricular Rate: 57 //min

## 2021-07-05 NOTE — Plan of Care (Addendum)
NURSE NOTE SUMMARY  [facility] - [facility] [address]   Patient Name: Clayton Davis   Attending Physician: Jenness Corner, MD   Today's date:   07/05/2021 LOS: 7 days   Shift Summary:                                                              1900:Took over patient care observed in bed at sleep easy to be aroused.  A/O X 4. Report back pain 7/10. Encouraged to repositioned for comfort.  PICC line to RUA site CDI.  2327: flushed PICC line, patent. IV Abt infusing.  Patient pain managed on p.o. oxycodone 10 mg, prn and schedule methadone  Family member at bed side supportive. Will continue to monitor. 0630: blood draw from PICC line, for lab, patient tolerated, schedule methadone  Given, started  schedule antibiotic. Patient stable in bed.   Provider Notifications:        Rapid Response Notifications:  Mobility:      PMP Activity: Step 3 - Bed Mobility (07/04/2021  8:30 PM)     Weight tracking:  Family Dynamic:   No data found.          Last Bowel Movement   Last BM Date: 06/30/21        Problem: Compromised Hemodynamic Status  Goal: Vital signs and fluid balance maintained/improved  Outcome: Progressing  Flowsheets (Taken 07/05/2021 0538)  Vital signs and fluid balance are maintained/improved:   Position patient for maximum circulation/cardiac output   Monitor/assess lab values and report abnormal values     Problem: Impaired Mobility  Goal: Mobility/Activity is maintained at optimal level for patient  Outcome: Progressing  Flowsheets (Taken 07/05/2021 0538)  Mobility/activity is maintained at optimal level for patient:   Increase mobility as tolerated/progressive mobility   Maintain proper body alignment   Reposition patient every 2 hours and as needed unless able to reposition self   Plan activities to conserve energy, plan rest periods   Encourage independent activity per ability   Assess for changes in respiratory status, level of consciousness and/or development of fatigue   Consult/collaborate  with Physical Therapy and/or Occupational Therapy     Problem: Peripheral Neurovascular Impairment  Goal: Extremity color, movement, sensation are maintained or improved  Outcome: Progressing  Flowsheets (Taken 07/05/2021 0538)  Extremity color, movement, sensation are maintained or improved:   Assess extremity for proper alignment   Teach/review/reinforce ankle pump exercises   VTE Prevention: Administer anticoagulant(s) and/or apply anti-embolism stockings/devices as ordered     Problem: Compromised skin integrity  Goal: Skin integrity is maintained or improved  Outcome: Progressing  Flowsheets (Taken 07/05/2021 0538)  Skin integrity is maintained or improved:   Assess Braden Scale every shift   Avoid shearing   Keep skin clean and dry   Relieve pressure to bony prominences   Monitor patient's hygiene practices   Keep head of bed 30 degrees or less (unless contraindicated)     Problem: Pain interferes with ability to perform ADL  Goal: Pain at adequate level as identified by patient  Outcome: Progressing  Flowsheets (Taken 07/05/2021 0538)  Pain at adequate level as identified by patient:   Identify patient comfort function goal   Assess for risk of opioid induced respiratory depression, including  snoring/sleep apnea. Alert healthcare team of risk factors identified.   Assess pain on admission, during daily assessment and/or before any "as needed" intervention(s)   Reassess pain within 30-60 minutes of any procedure/intervention, per Pain Assessment, Intervention, Reassessment (AIR) Cycle   Evaluate patient's satisfaction with pain management progress     Problem: Side Effects from Pain Analgesia  Goal: Patient will experience minimal side effects of analgesic therapy  Outcome: Progressing  Flowsheets (Taken 07/05/2021 0538)  Patient will experience minimal side effects of analgesic therapy:   Monitor/assess patient's respiratory status (RR depth, effort, breath sounds)   Assess for changes in cognitive function      Problem: Moderate/High Fall Risk Score >5  Goal: Patient will remain free of falls  Outcome: Progressing  Flowsheets (Taken 07/04/2021 2030)  VH Moderate Risk (6-13):   ALL REQUIRED LOW INTERVENTIONS   USE OF BED EXIT ALARM IF PATIENT IS CONFUSED OR IMPULSIVE. PLACE RESET BED ALARM SIGN ABOVE BED   Include family/significant other in multidisciplinary discussion regarding plan of care as appropriate   Remain with patient during toileting   Request PT/OT therapy consult order from Physician for patients with gait/mobility impairment   Use assistive devices     Problem: Compromised Tissue integrity  Goal: Damaged tissue is healing and protected  Outcome: Progressing  Flowsheets (Taken 07/05/2021 0538)  Damaged tissue is healing and protected:   Monitor/assess Braden scale every shift   Relieve pressure to bony prominences for patients at moderate and high risk   Avoid shearing injuries   Increase activity as tolerated/progressive mobility   Keep intact skin clean and dry   Monitor patient's hygiene practices  Goal: Nutritional status is improving  Outcome: Progressing  Flowsheets (Taken 07/05/2021 0538)  Nutritional status is improving: Allow adequate time for meals

## 2021-07-05 NOTE — Progress Notes (Signed)
Quick Doc  [facility] [address]   Patient Name: Clayton Davis   Attending Physician: Jenness Corner, MD   Today's date:   07/05/2021 LOS: 7 days   Expected Discharge Date      Quick  Assessment:                                                              ReAdmit Risk Score: 19.07    CM Comments: 07/05/21 MSW [facility] - [place] PLANNING UPDATE - Reviewed chart for [place] planning. No recommendations on chart at this time. ID is currently administering Cefepime 2g every 8 hours until 10/10. [place] planning needs pending clinical course.     Provider Notifications:      Pauline Aus, LMSW  Discharge Planner/Social Worker  Tel: [phone]

## 2021-07-05 NOTE — Progress Notes (Signed)
[facility] PAIN MANAGEMENT PROGRESS NOTE    Subjective   Patient reports that he was able to sleep last night and exercised while in bed/using IS regularly and turning in bed now. He denies oversedation and declined CPAP per nursing. Still constipated. Denies chest pain, palpitation, or shortness of breath.    He wants to get up today.    Objectives:     Vitals:    07/05/21 0921   BP: 112/60   Pulse: 68   Resp:    Temp:    SpO2:      General appearance -bed resting, eyes closed/awakens easily with verbal stimuli, in no distress.  Mental status -alert, oriented to person, place, and time  CV - RRR, no murmurs  Chest - clear, no wheezes or rales  Abdomen - Soft, mildly distended, no nausea    Neurological - no headache or back pain.  BLE sensory grossly intact.  Musculoskeletal - lumbar spine pain radiation to bil hips/RLE, bLE gross motor function intact  Extremities -  no pedal edema, no cyanosis  Skin -pale coloration, no rashes.  Psych - pleasant, anxious,  good eye contact.    Labs/Imaging:  Recent Labs     07/05/21  0637   WBC 9.6   Hemoglobin 10.8*   Hematocrit 34.0*     No results for input(s): PT, INR, PTT in the last 24 hours.  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  Opioid Use Disorder, on Methadone 145 mg daily, follows with [facility] (rx bottle verified and current. Dr. Patsi Sears), recent relapse with IV cocaine    Multimorbidities: ADHD, Anxiety/depression, hx of PE/COVID infection fall 2021      - no changes in his current analgesic regimen as he is slowly improving  - encouraged to get up and ambulate in room with staff assist, as well as taking laxatives for constipation.  - continue to monitor sedation     Cleatis Polka DNP  07/05/2021

## 2021-07-05 NOTE — Progress Notes (Signed)
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  I therefore IR procedure aborted.  I was notified by IR PA they are scheduling it for Monday.       ID physician Dr. Luisa Hart communicated with me.  She advised not to use antibiotic as patient is stable.  She is okay for antibiotic to resume over the weekend if patient keeps getting sick and febrile.  The idea behind this is so that they specimen from the aspiration would be high yield to give [place] a more targeted antibiotic therapy.      10/01- wbc normalized since yest . add lidocaine patch . Otherwise plan is the same . Qtc 558  10/2 for IR bx tomorrow.  Hold Advil after noon dose      10/3 IR aspiration done and abx IV resumed .       10/4 Slept better .       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  I therefore IR procedure aborted.  I was notified by IR PA they are scheduling it for Monday.       ID physician Dr. Luisa Hart communicated with me.  She advised not to use antibiotic as patient is stable.  She is okay for antibiotic to resume over the weekend if patient keeps getting sick and febrile.  The idea behind this is so that they specimen from the aspiration would be high yield to give [place] a more targeted antibiotic therapy.      10/01- wbc normalized since yest . add lidocaine patch . Otherwise plan is the same . Qtc 558  10/2 for IR bx tomorrow.  Hold Advil after  noon dose      10/3 for interventional radiology aspiration today.  I spoke to wife in the room and I also explained to her regards to limitations for pain management in regards to QTC prolongation and potential risks  Later  IR aspiration done yest and abx resumed .     10/4 continue iv abx . Await cultures . 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 )   To mitigate risks--Methadone 145 mg daily  dose reduced and separated to  40 tid  , decreased prozac dose by half  Daily ekg , tele, monitor lytes   resolving       Anemia  -Hemoglobin 11/39 on admission   -Previous hemoglobin 13/42  -Monitor for signs of bleed  -Iron studies -- suggests sequestration   Prophylactic pepcid  Lab Results   Component Value Date    HGB 10.8 (L) 07/05/2021       Hep C ab positive   Out pt follow up with gi/hepatologist   I updated him on this       Hyperlipidemia  -Continue statin    Mood disorder/anxiety  -Continue Wellbutrin, buspirone, fluoxetine    Constipation   Bowel regimen       Dispo:   As per ID, IV abx until the 10th then transition to cipro po        DVT Prophylaxis: Resume Lovenox         Code Status: Full Code      MEDICATIONS     Current Facility-Administered Medications   Medication Dose Route Frequency    atorvastatin  40 mg Oral Daily    buPROPion SR  150 mg Oral Daily    busPIRone  15 mg Oral Q12H SCH    cefepime  2 g Intravenous Q8H    celecoxib  200 mg Oral BID    docusate sodium  200 mg Oral BID    enoxaparin  40 mg Subcutaneous Q24H    famotidine  20 mg Oral BID AC     FLUoxetine  10 mg Oral QAM    lactobacillus species  50 Billion CFU Oral Daily    lidocaine  1 patch Transdermal Q24H    metaxalone  800 mg Oral TID    methadone  35 mg Oral Q8H SCH    polyethylene glycol  17 g Oral Daily    pregabalin  50 mg Oral TID       PHYSICAL EXAM     Vitals:    07/05/21 1558   BP: 109/73   Pulse: 70   Resp:    Temp: 98.1 F (36.7 C)   SpO2: 97%       Temperature: Temp  Min: 97.7 F (36.5 C)  Max: 98.1 F (36.7 C)  Pulse: Pulse  Min: 67  Max: 77  Respiratory: Resp  Min: 16  Max: 18  Non-Invasive BP: BP  Min: 92/63  Max: 120/76  Pulse Oximetry SpO2  Min: 96 %  Max: 97 %      Constitutional: Seems to be NAD if doesn't move   HEENT: NC/AT, PERRL,EOMI. Neck supple  Cardiovascular: RRytm, normal S1 S2, no murmurs, gallops.  Respiratory:  CTA bilaterally Normal rate. No retractions or increased work of breathing.  Gastrointestinal: Normal BS, non-distended, soft, non-tender, no rebound or guarding.  Genitourinary: no suprapubic or costovertebral angle tenderness  Musculoskeletal: Lower extremity movement limited by pain   skin: no rashes, jaundice , cyanosis  Neurologic: AAO x3.  Able to move lower extremities but painful back   Psychiatric: Flat affect . Cooperative.     LABS     Recent Labs   Lab 07/05/21  0637 07/04/21  0331 07/03/21  0401   WBC 9.6 11.4* 11.7*   RBC 4.21 4.17 4.30   Hemoglobin 10.8* 10.8* 11.1*   Hematocrit 34.0* 33.6* 34.8*   MCV 81 81 81   PLT CT 442* 416 415         Recent Labs   Lab  07/05/21  1610 07/04/21  0331 07/03/21  0401 07/02/21  0359 07/01/21  0403 06/30/21  0448 06/29/21  0527   Sodium 139 138 138 137 137  More results in Results Review 140   Potassium 3.8 3.7 3.5 3.7 4.1  More results in Results Review 4.3   Chloride 104 103 106 103 100  More results in Results Review 102   CO2 26 23 21  23.1 26  More results in Results Review 26   BUN 13 13 12 17 14   More results in Results Review 18   Creatinine 0.60* 0.64* 0.67* 0.72* 0.73*  More results in Results Review  0.71*   Glucose 105* 81 76 78 90  More results in Results Review 118*   Calcium 9.5 9.4 9.5 9.2 9.6  More results in Results Review 9.7   Magnesium  --   --   --   --   --   --  1.9   More results in Results Review = values in this interval not displayed.         Recent Labs   Lab 07/05/21  0637 07/04/21  0331 07/03/21  0401   ALT 51 49 48   AST (SGOT) 39 49* 56*   Bilirubin, Total 0.3 0.4 0.4   Albumin 2.8* 2.7* 2.7*   Alkaline Phosphatase 58 57 51                       Microbiology Results (last 15 days)       Procedure Component Value Units Date/Time    Lab Specimen No [[id number]]     Order Status: Sent     Lab Specimen Yes [[id number]] Collected: 07/01/21 1545    Order Status: Completed Specimen: Other Updated: 07/01/21 1634     Lab Specimen Order Notification Lab Specimen Notification     Comment: The above 1 analytes were performed by [facility] Main Lab [phone])  [address],[place] [address]         AFB (Mycobacterium) Culture and Smear [[id number]] Collected: 07/01/21 1545    Order Status: Canceled Specimen: Abscess from Seroma     Multi-Specimen Fungal Culture and Smear [[id number]] Collected: 07/01/21 1545    Order Status: Completed Specimen: Abscess from Spine Updated: 07/02/21 0832     Fungal Smear No Fungal Elements Seen    Abscess Culture and Smear [[id number]]  (Abnormal)  (Susceptibility) Collected: 07/01/21 1545    Order Status: Completed Specimen: Abscess from Spine Updated: 07/04/21 0910     Gram Stain --     No Epithelial Cells  Occasional WBC's Seen  No Organisms Seen       Isolate 1 Scant Growth  Pseudomonas aeruginosa      Susceptibility        Pseudomonas aeruginosa      BACTERIAL SUSCEPTIBILITY MIC (MCG/ML)     Cefepime 4  Susceptible     Ciprofloxacin <=0.25  Susceptible     Gentamicin <=1  Susceptible     Imipenem 2  Susceptible     Levofloxacin 2  Intermediate     Pip/Tazo 16  Susceptible     Tobramycin <=1  Susceptible                          Lab Specimen Yes  [[id number]] Collected: 07/01/21 1545    Order Status: Completed Specimen: Other Updated: 07/01/21 1634     Lab Specimen Order Notification Lab  Specimen Notification     Comment: The above 1 analytes were performed by [facility] Main Lab [id number])  [address],[place] [address]         Sterile Body Fluid Culture and Smear [[id number]] Collected: 07/01/21 1545    Order Status: Canceled Specimen: Other from Seroma     Lab Specimen Yes [[id number]]     Order Status: Canceled     Abscess Culture and Smear [[id number]]     Order Status: Canceled Specimen: Abscess from Spine     Sterile Body Fluid Culture and Smear [[id number]]     Order Status: Canceled Specimen: Other from Seroma     Blood Culture - Venipuncture [[id number]] Collected: 06/28/21 0717    Order Status: Completed Specimen: Blood from Venipuncture Updated: 07/03/21 0801     Culture Result No Growth in 5 Days    Blood Culture - Venipuncture [[id number]] Collected: 06/28/21 0450    Order Status: Completed Specimen: Blood from Venipuncture Updated: 07/03/21 0801     Culture Result --     Blood culture volume for this collection was less than the optimal needed to acheive adequate sensitivity.  No Growth in 5 Days               RADIOLOGY     No results found.    HH needs if any:  There are no questions and answers to display.       Nutrition assessment (if any) in collaboration with medical nutritionist :            Signed,  Jenness Corner, MD  6:00 PM 07/05/2021

## 2021-07-05 NOTE — Plan of Care (Addendum)
NURSE NOTE SUMMARY  [facility] [address]   Patient Name: Clayton Davis   Attending Physician: Jenness Corner, MD   Today's date:   07/05/2021 LOS: 7 days   Shift Summary:                                                              Shift report received. Pt assessed. Pt alert and oriented but hard to arouse this AM. PICC line in place and flushed per order. AM medications taken well. Pt refused miralax as part of bowel regimen. Pt aware of the importance of bowel elimination. Wife at bedside. Temp: 97.7 F (36.5 C), Heart Rate: 68, Resp Rate: 16, BP: 112/60. Pt able to move extremities well.   Pt requested pain medication. Given per [facility]. No other complaints voiced at this time.    Pt reports pain 7-9 throughout shift and when nurse is in room has requested as need pain medications. However, Pt is hard to arouse at times.  When going into patients room at 1400 to give pt his medication he stated, "how is it 2pm already." Pt appears to have rested with ease throughout morning and afternoon.  Pt requested to sit EOB. 2 staff assisted. Pt tolerated well.    Dr. Sandria Manly ordered PT/OT.         Provider Notifications:        Rapid Response Notifications:  Mobility:      PMP Activity: Step 3 - Bed Mobility (07/04/2021  8:30 PM)     Weight tracking:  Family Dynamic:   No data found.          Last Bowel Movement   Last BM Date: 06/30/21       Problem: Compromised Hemodynamic Status  Goal: Vital signs and fluid balance maintained/improved  Outcome: Progressing  Flowsheets (Taken 07/05/2021 0538 by Ferdie Ping, RN)  Vital signs and fluid balance are maintained/improved:   Position patient for maximum circulation/cardiac output   Monitor/assess lab values and report abnormal values     Problem: Impaired Mobility  Goal: Mobility/Activity is maintained at optimal level for patient  Outcome: Progressing  Flowsheets (Taken 07/05/2021 0538 by Ferdie Ping, RN)  Mobility/activity is maintained at  optimal level for patient:   Increase mobility as tolerated/progressive mobility   Maintain proper body alignment   Reposition patient every 2 hours and as needed unless able to reposition self   Plan activities to conserve energy, plan rest periods   Encourage independent activity per ability   Assess for changes in respiratory status, level of consciousness and/or development of fatigue   Consult/collaborate with Physical Therapy and/or Occupational Therapy     Problem: Peripheral Neurovascular Impairment  Goal: Extremity color, movement, sensation are maintained or improved  Outcome: Progressing  Flowsheets (Taken 07/05/2021 0538 by Ferdie Ping, RN)  Extremity color, movement, sensation are maintained or improved:   Assess extremity for proper alignment   Teach/review/reinforce ankle pump exercises   VTE Prevention: Administer anticoagulant(s) and/or apply anti-embolism stockings/devices as ordered     Problem: Compromised skin integrity  Goal: Skin integrity is maintained or improved  Outcome: Progressing  Flowsheets (Taken 07/05/2021 0538 by Ferdie Ping, RN)  Skin integrity is maintained or improved:   Assess  Braden Scale every shift   Avoid shearing   Keep skin clean and dry   Relieve pressure to bony prominences   Monitor patient's hygiene practices   Keep head of bed 30 degrees or less (unless contraindicated)     Problem: Pain interferes with ability to perform ADL  Goal: Pain at adequate level as identified by patient  Outcome: Progressing  Flowsheets (Taken 07/05/2021 0538 by Ferdie Ping, RN)  Pain at adequate level as identified by patient:   Identify patient comfort function goal   Assess for risk of opioid induced respiratory depression, including snoring/sleep apnea. Alert healthcare team of risk factors identified.   Assess pain on admission, during daily assessment and/or before any "as needed" intervention(s)   Reassess pain within 30-60 minutes of any procedure/intervention, per Pain  Assessment, Intervention, Reassessment (AIR) Cycle   Evaluate patient's satisfaction with pain management progress     Problem: Side Effects from Pain Analgesia  Goal: Patient will experience minimal side effects of analgesic therapy  Outcome: Progressing  Flowsheets (Taken 07/05/2021 0538 by Ferdie Ping, RN)  Patient will experience minimal side effects of analgesic therapy:   Monitor/assess patient's respiratory status (RR depth, effort, breath sounds)   Assess for changes in cognitive function     Problem: Moderate/High Fall Risk Score >5  Goal: Patient will remain free of falls  Outcome: Progressing  Flowsheets (Taken 06/30/2021 0213 by Ferdie Ping, RN)  Moderate Risk (6-13):   LOW-Fall Interventions Appropriate for Low Fall Risk   MOD-Floor mat at bedside (where available) if appropriate   MOD-Remain with patient during toileting   MOD-Place bedside commode and assistive devices out of sight when not in use   MOD-Apply bed exit alarm if patient is confused   MOD-Request PT/OT consult order for patients with gait/mobility impairment     Problem: Compromised Tissue integrity  Goal: Damaged tissue is healing and protected  Outcome: Progressing  Flowsheets (Taken 07/05/2021 0538 by Ferdie Ping, RN)  Damaged tissue is healing and protected:   Monitor/assess Braden scale every shift   Relieve pressure to bony prominences for patients at moderate and high risk   Avoid shearing injuries   Increase activity as tolerated/progressive mobility   Keep intact skin clean and dry   Monitor patient's hygiene practices  Goal: Nutritional status is improving  Outcome: Progressing  Flowsheets (Taken 07/05/2021 0538 by Ferdie Ping, RN)  Nutritional status is improving: Allow adequate time for meals

## 2021-07-06 LAB — CBC AND DIFFERENTIAL
Basophils %: 0.7 % (ref 0.0–3.0)
Basophils Absolute: 0.1 10*3/uL (ref 0.0–0.3)
Eosinophils %: 3.9 % (ref 0.0–7.0)
Eosinophils Absolute: 0.4 10*3/uL (ref 0.0–0.8)
Hematocrit: 35.6 % — ABNORMAL LOW (ref 39.0–52.5)
Hemoglobin: 11.3 gm/dL — ABNORMAL LOW (ref 13.0–17.5)
Lymphocytes Absolute: 3.1 10*3/uL (ref 0.6–5.1)
Lymphocytes: 30 % (ref 15.0–46.0)
MCH: 26 pg — ABNORMAL LOW (ref 28–35)
MCHC: 32 gm/dL (ref 32–36)
MCV: 81 fL (ref 80–100)
MPV: 7.4 fL (ref 6.0–10.0)
Monocytes Absolute: 1 10*3/uL (ref 0.1–1.7)
Monocytes: 9.3 % (ref 3.0–15.0)
Neutrophils %: 56.1 % (ref 42.0–78.0)
Neutrophils Absolute: 5.8 10*3/uL (ref 1.7–8.6)
PLT CT: 439 10*3/uL (ref 130–440)
RBC: 4.38 10*6/uL (ref 4.00–5.70)
RDW: 12.5 % (ref 11.0–14.0)
WBC: 10.3 10*3/uL (ref 4.0–11.0)

## 2021-07-06 LAB — ECG 12-LEAD
P Wave Axis: 47 deg
P-R Interval: 137 ms
Patient Age: 29 years
Q-T Interval(Corrected): 462 ms
Q-T Interval: 451 ms
QRS Axis: 43 deg
QRS Duration: 95 ms
T Axis: 27 years
Ventricular Rate: 63 //min

## 2021-07-06 LAB — COMPREHENSIVE METABOLIC PANEL
ALT: 48 U/L (ref 0–55)
AST (SGOT): 28 U/L (ref 10–42)
Albumin/Globulin Ratio: 0.66 Ratio — ABNORMAL LOW (ref 0.80–2.00)
Albumin: 2.9 gm/dL — ABNORMAL LOW (ref 3.5–5.0)
Alkaline Phosphatase: 54 U/L (ref 40–145)
Anion Gap: 14 mMol/L (ref 7.0–18.0)
BUN / Creatinine Ratio: 16.7 Ratio (ref 10.0–30.0)
BUN: 12 mg/dL (ref 7–22)
Bilirubin, Total: 0.4 mg/dL (ref 0.1–1.2)
CO2: 25 mMol/L (ref 20–30)
Calcium: 9.6 mg/dL (ref 8.5–10.5)
Chloride: 103 mMol/L (ref 98–110)
Creatinine: 0.72 mg/dL — ABNORMAL LOW (ref 0.80–1.30)
EGFR: 126 mL/min/{1.73_m2} (ref 60–150)
Globulin: 4.4 gm/dL — ABNORMAL HIGH (ref 2.0–4.0)
Glucose: 98 mg/dL (ref 71–99)
Osmolality Calculated: 275 mOsm/kg (ref 275–300)
Potassium: 4 mMol/L (ref 3.5–5.3)
Protein, Total: 7.3 gm/dL (ref 6.0–8.3)
Sodium: 138 mMol/L (ref 136–147)

## 2021-07-06 NOTE — PT Eval Note (Signed)
[facility]  Patient: Clayton Davis     CSN: [id number]    Bed: 4533/4533-A  Physical Therapy EVALUATION  Visit#: 1   Treatment Frequency: 3-4x/wk  Last seen by a physical therapist vs. Physical therapist assistant: 07/06/2021    DISCHARGE RECOMMENDATIONS   Discharge Recommendations:   Other(Comment) (TCU/swing bed with follow up PT)   Pending medical progress  Pending progress in next therapy session  *Discharge recommendations are subject to change based on patient's progress and/or home support changes - please refer to most recent PT note for current recommendation    DME recommended for Discharge:   TBD at next level of care  To be determined closer to discharge    PMP (Progressive Mobility Program) Recommendations:   Recommend patient  dangle edge of bed 2-3 times/day with physical assist and/or supervision of 1 staff, be repositioned in bed every 2 hours, progress out of bed mobility via stand pivot transfer using front wheeled walker  as tolerated.     Precautions, Contraindications, Awareness details:   Spinal Precautions (no bending, lifting or twisting) for pain  Falls  Mobility protocol     PT Assessment and Plan of Care:     HPI (per physician charting) and Pertinent Medical Details:  Admitted 06/28/2021 with/for worsening back pain. 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 Hx of opiod dependence/pain management, ATC prolongation (not new, but needs monitoring), anemia, Hep C ab positive, HLD, mood disorder/anxiety    Goals:    STG (5-10 visits)  1. Patient will be independent with bed mobility to improve functional mobility and progress with out of bed mobility. NEW  2. Patient will be Modified Independent with sit to stand transfers using least restrictive device to progress with out of bed mobility. NEW  3. Patient will be Modified Independent with chair/bed transfer using least restrictive device to improve functional mobility  and activity tolerance. NEW  4. Patient will ambulate 50 ft independent assistance using least restrictive device to improve functional mobility and activity tolerance. NEW  5. Patient will be able to complete HEP consisting of at least 4 exercises to maintain strength and progress out of bed mobility. NEW      PT goals will be updated based on progress while admitted in [facility] and with Plan of care.         PT Assessment:  At baseline, patient is independent with community ambulation and ADL's/IADL's with no device. During PT evaluation, patient only tolerated sitting up as he reports not being able to lift arms up from pressing beside him in bed due to 'back weakness'. PT unable to complete manual testing of LE strength at edge of bed due to pain and poor activity tolerance. Patient did demonstrated at least 3/5 strength in hamstrings with supine heel slides. Full LE ROM noted. Currently, patient hopes to discharge home and can have family place a ramp to reach bottom level of split foyer style home to stay on one level. Due to currently poor accessible home, poor activity tolerance, assistance needed for bed mobility, and medical history, PT recommending swing bed/TCU for follow up therapy needs and to assist with medical needs (medication for osteomyelitis).     Patient presenting with the following PT Impairments:decreased strength, decreased safety/judgement during functional mobility, decreased activity tolerance, decreased functional mobility, decreased balance, gait deficits, pain    Patient will benefit from skilled PT services in order to  improve activity tolerance,  functional mobility, reduce fall risk, and return home safely     Treatment/interventions: Exercise, Gait training, Stair training, Neuromuscular re-education, Functional transfer training, LE strengthening/ROM, Cognitive reorientation, Patient/caregiver training, Equipment eval/education, Bed mobility, Compensatory technique education,  Continued evaluation    Due to the presence of a limited number of treatment options and 1-2 comorbidities or personal factors that affect performance, as well as patient's stable and/or uncomplicated characteristics, modifications were NOT necessary to complete evaluation when examining total of 3 elements (includes body structures and functions, activity limitations and/or participation restriction) determines the degree of complexity for this patient is LOW    Rehabilitation Potential:Good with ongoing PT upon discharge from acute care.     Discussed risk, benefits and Plan of Care with: Patient    History Based on physician charted EPIC/EMR information:     Medical Diagnosis: Osteomyelitis [M86.9]  Lumbar discitis [M46.46]    Problem list:  Patient Active Problem List   Diagnosis    History of pulmonary embolism    History of COVID-19    Class 1 obesity with body mass index (BMI) of 32.0 to 32.9 in adult, unspecified obesity type, unspecified whether serious comorbidity present    Anxiety    Depression, unspecified depression type    Osteomyelitis    Lumbar discitis    Acute osteomyelitis of lumbar spine    IV drug abuse        Past Medical/Surgical History:  Past Medical History:   Diagnosis Date    ADHD     Anxiety     Depression     No known health problems     Sleep apnea       Past Surgical History:   Procedure Laterality Date    NO PAST SURGERIES         Social History    Information per Patient, Chart review:    Home Living Arrangements:  Living Arrangements: Spouse/significant other  Assistance Available: Part time, normally works 9-5 job, but can set up to work from home if patient is discharged home, so could have full time support of one person  Type of Home: House  Home Layout: [place], with 16-17 stair(s) to enter, 1 rail(s), plans to have 'ramp' entrance to bottom level of split foyer house, which has a half bath and 'place to sleep'. Currently will have to manage at least 16 steps with rail to  reach inside of home    Prior Level of Function:  Community ambulation  Mobility:  Independent with  No assistive device  Fall history: none reported    DME available at home:  Patient reported no devices    Subjective   "If I lift either of my arms, I will fall..."  "your hands there aren't going to help me. In fact there are making me fall"- patient had hands pressed down on both sides of trunk for support due to pain. PT positioned himself to have hands resting on both scapulas to encourage static sitting with no UE support, but patient stated second comment before PT provided any support besides contact assist   Patient/family/caregiver consent to therapy session is noted by the participation in the therapy session.    Patient/caregiver goal for PT: not stated    Pain:  At Rest: 6/10  With Activity: 7/10  Location: Back:  Lower  Interventions: Medication (see eMAR), Repositioned, Rest    Examination of Body Systems:   Patient's medical condition is appropriate for Physical therapy intervention at  this time    Observation of patient:  Patient is in bed with PICC lines disconnected, bed alarm,      Cognition:  Oriented to: Person, Place , Time, and Situation  Command following: Follows multi-step commands with increased time, Follows multi-step commands with repetition, 100% of the time  Alertness/Arousal: Appropriate responses to stimuli   Attention Span:Attends to task with redirection    Vital Signs (Cardiovascular):  Stable with no signs/symptoms of distress    Edema: none noted  Skin Inspection: appears intact      Balance:  Static Sitting:  supervision with bilateral UE support            Musculoskeletal Examination:            Range of motion:  Right LE: Grossly WFL  Left LE: Grossly WFL       Strength:  Not tested this session due to patient's poor activity tolerance with minimal LE movement in and out of bed. Based on leg movement in bed, patient at least has 3/5 strength in hamstring muscles (heel  slides) and ankles (AP)    Tone:  Not tested    Functional Mobility:    Bed Mobility:  Supine to Sit:   Moderate assist.   Cues for Log rolling., Cues for Sequencing., Cues for Hand placement., HOB elevated, Bed rail used, assist at trunk, assist at LE(s), to the left  Sit to Supine:   Moderate assist.   Cues for Log rolling., Cues for Sequencing., Cues for Hand placement., HOB flat, assist at trunk, assist at LE(s)    Transfers:  Not tested due to poor activity tolerance due to pain and patient refusing    Locomotion:  Not tested due to same reason as transfers        Treatment Interventions this session:   Evaluation    Education Provided:   TOPICS: role of physical therapy, plan of care, goals of therapy and safety with mobility and ADLs, benefits of activity, activity with nursing     Learner educated: Nursing Staff  Method: Explanation and Demonstration  Response to education: Verbalized understanding    Patient Position at End of Treatment:   Supine, in bed, in the room, Needs in reach, Bed/chair alarm set, and No distress    Team Communication:     Spoke to: RN/LPN - Abran Duke  Regarding: Pre-session re: patient status, Patient position at end of session, Discharge needs, Patient participation with Therapy, Progressive Mobility Program recommendations  Whiteboard updated: Yes  PT/PTA communication: via written note and verbal communication as needed.    Time of treatment:  Time Calculation  PT Received On: 07/06/21  Start Time: 1024  Stop Time: 1052  Time Calculation (min): 28 min    Tito Dine, PT, DPT

## 2021-07-06 NOTE — Plan of Care (Addendum)
NURSE NOTE SUMMARY  [facility] [address]   Patient Name: Clayton Davis   Attending Physician: Jenness Corner, MD   Today's date:   07/06/2021 LOS: 8 days   Shift Summary:                                                              1900:Took over patient care at sleep in bed easy to aroused to voice.Report pain 7/10, at mid back. Patient repositioned for comfort. Physical need made know fluid provided for. IV to RUA double Lumen, intact,flushing, blood returned.  Patient pain managed on prn oxy, and scheduled methadone.  Patient continue on IV cefepime, no side effect noted. Stable at end   Of shift on continuous pulse ox.       Provider Notifications:        Rapid Response Notifications:  Mobility:      PMP Activity: Step 3 - Bed Mobility (07/05/2021  8:03 PM)     Weight tracking:  Family Dynamic:   No data found.          Last Bowel Movement   Last BM Date: 06/30/21 (Refused bowel protocol.)        Problem: Compromised Hemodynamic Status  Goal: Vital signs and fluid balance maintained/improved  Outcome: Progressing     Problem: Impaired Mobility  Goal: Mobility/Activity is maintained at optimal level for patient  Outcome: Progressing     Problem: Peripheral Neurovascular Impairment  Goal: Extremity color, movement, sensation are maintained or improved  Outcome: Progressing     Problem: Compromised skin integrity  Goal: Skin integrity is maintained or improved  Outcome: Progressing     Problem: Pain interferes with ability to perform ADL  Goal: Pain at adequate level as identified by patient  Outcome: Progressing     Problem: Side Effects from Pain Analgesia  Goal: Patient will experience minimal side effects of analgesic therapy  Outcome: Progressing     Problem: Moderate/High Fall Risk Score >5  Goal: Patient will remain free of falls  Outcome: Progressing     Problem: Compromised Tissue integrity  Goal: Damaged tissue is healing and protected  Outcome: Progressing  Goal: Nutritional  status is improving  Outcome: Progressing

## 2021-07-06 NOTE — Progress Notes (Signed)
Medicine Progress Note   [facility]  [employer]   Patient Name: Clayton Davis, Clayton Davis LOS: 8 days   Attending Physician: Jenness Corner, MD PCP: Marisa Sprinkles, MD      Hospital Course:                                                            Clayton Davis is a 30 y.o. male patient with a past medical history of mood disorder, hyperlipidemia, opioid dependence on methadone, presents to the ED from home with chief complaint of worsening back pain.  MRI of the lumbar spine showed osteomyelitis-discitis at L4-L5.  He subsequently underwent aspiration of the L4 and L5 per IR.  He was then started on empiric IV antibiotics.  Culture grew Pseudomonas.  IV antibiotics is de-escalated to cefepime every 8 hourly.  He is followed by telemetry ID, pain management.     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 status post biopsy  Continue cefepime  Appreciate tele ID input  Plan to continue cefepime until 10/10 and switch to cipro with close OP follow up  Has complained of R hip stiffness  Will obtain MRI of the R hip    Opioid Dependence in the setting of acute pain  IVDU  Continue methadone/lyrica/skelaxin/oxycodone PRN  Pain mgt following    QTC prolongation  Resolved    Anemia of inflammation  HH stable    Disposition:   MRI of the R hip  No change with antibiotic  DVT PPX: Medication VTE Prophylaxis Orders: enoxaparin (LOVENOX) syringe 40 mg  Mechanical VTE Prophylaxis Orders: Mechanical VTE: Pneumatic Compression; Knee high  Code:  Full Code       Subjective     C/o R hip being more stiff, reports pain is a barrier for him to ambulate           Objective   Physical Exam:       Vitals: T:98.4 F (36.9 C) (Temporal), BP:99/65, HR:77, RR:14, SaO2:94%         General: Patient is awake. In no acute distress.  HEENT: No conjunctival drainage, vision is intact, anicteric sclera.  Neck: Supple, no thyromegaly.  Chest: CTA  bilaterally. No rhonchi, no wheezing. No use of accessory muscles.  CVS: Normal rate and regular rhythm no murmurs, without JVD, no pitting edema, pulses palpable.  Abdomen: Soft, non-tender, no guarding or rigidity, with normal bowel sounds.  Extremities: No calf swelling and no gross deformity.LLE ROM intact, decreased ROM R hip due to pain  Skin: Warm, dry, no rash and no worrisome lesions.  NEURO: No motor or sensory deficits.  Psychiatric: Alert, interactive, appropriate, normal affect.    Weight Monitoring 08/16/2020 01/14/2021 05/17/2021 05/18/2021 05/26/2021 05/30/2021 06/28/2021   Height 180 cm 180.3 cm 180.3 cm 180.3 cm - 180.3 cm 180.3 cm   Height Method Stated - Stated Stated - Stated Stated   Weight 106 kg 105.325 kg 99.791 kg 93.8 kg 92.2 kg 92.2 kg 86.183 kg   Weight Method Standing Scale - Stated Actual Actual Stated Stated   BMI (calculated) 32.8 kg/m2 32.5 kg/m2 30.7 kg/m2 28.9 kg/m2 - 28.4 kg/m2 26.6 kg/m2  Intake/Output Summary (Last 24 hours) at 07/06/2021 1022  Last data filed at 07/05/2021 1158  Gross per 24 hour   Intake --   Output 1000 ml   Net -1000 ml     Body mass index is 26.5 kg/m.     Meds:     Current Facility-Administered Medications   Medication Dose Route Frequency    atorvastatin  40 mg Oral Daily    buPROPion SR  150 mg Oral Daily    busPIRone  15 mg Oral Q12H SCH    cefepime  2 g Intravenous Q8H    celecoxib  200 mg Oral BID    docusate sodium  200 mg Oral BID    enoxaparin  40 mg Subcutaneous Q24H    famotidine  20 mg Oral BID AC    FLUoxetine  10 mg Oral QAM    lactobacillus species  50 Billion CFU Oral Daily    lidocaine  1 patch Transdermal Q24H    metaxalone  800 mg Oral TID    methadone  35 mg Oral Q8H SCH    polyethylene glycol  17 g Oral Daily    pregabalin  50 mg Oral TID       PRN Meds: acetaminophen **OR** acetaminophen **OR** acetaminophen, cloNIDine, LORazepam, naloxone, oxyCODONE, polyethylene glycol.     LABS:     Estimated Creatinine Clearance: 159.8 mL/min (A)  (based on SCr of 0.72 mg/dL (L)).  Recent Labs   Lab 07/06/21  0424 07/05/21  0637   WBC 10.3 9.6   RBC 4.38 4.21   Hemoglobin 11.3* 10.8*   Hematocrit 35.6* 34.0*   MCV 81 81   PLT CT 439 442*             Lab Results   Component Value Date    HGBA1CPERCNT 5.3 01/23/2021     Recent Labs   Lab 07/06/21  0424 07/05/21  0637 07/04/21  0331   Glucose 98 105* 81   Sodium 138 139 138   Potassium 4.0 3.8 3.7   Chloride 103 104 103   CO2 25 26 23    BUN 12 13 13    Creatinine 0.72* 0.60* 0.64*   EGFR 126 133 131   Calcium 9.6 9.5 9.4     Recent Labs   Lab 07/06/21  0424 07/05/21  0637   Albumin 2.9* 2.8*   Protein, Total 7.3 7.2   Bilirubin, Total 0.4 0.3   Alkaline Phosphatase 54 58   ALT 48 51   AST (SGOT) 28 39           Invalid input(s):  AMORPHOUSUA   Patient Lines/Drains/Airways Status       Active PICC Line / CVC Line / PIV Line / Drain / Airway / Intraosseous Line / Epidural Line / ART Line / Line / Wound / Pressure Ulcer / NG/OG Tube       Name Placement date Placement time Site Days    PICC Double Lumen 07/03/21 Right Basilic 07/03/21  1421  Basilic  2                   PICC Placement Double Lumen- VH    Result Date: 07/03/2021  IMPRESSION: Successful fluoroscopic and ultrasound guided PICC line placement. ReadingStation:[email or web address]    CT Guided Asp Disc or Paravertebral    Result Date: 07/02/2021  IMPRESSION: Successful percutaneous CT guided L4-L5 Disc Core Biopsy and Aspiration with moderate sedation. ReadingStation:[id number]    Home Health Needs:  There are no questions and answers to display.       Nutrition assessment done in collaboration with Registered Dietitians:     Time spent:      Jenness Corner, MD     07/06/21,10:22 AM   MRN: [id number]                                      CSN: [id number] DOB: [birth year]

## 2021-07-06 NOTE — Plan of Care (Addendum)
NURSE NOTE SUMMARY  [facility] [address]   Patient Name: Clayton Davis   Attending Physician: Jenness Corner, MD   Today's date:   07/06/2021 LOS: 8 days   Shift Summary:                                                              Assumed pt care. Assessment and vitals complete.  PICC line intact, blood return noted. INT  PRN oxycodone given for pain relief.     Pt resting in bed, call bell within reach.     Report given to oncoming nurse.      Provider Notifications:        Rapid Response Notifications:  Mobility:      PMP Activity: Step 4 - Dangle at Bedside (07/06/2021  7:40 PM)     Weight tracking:  Family Dynamic:   No data found.          Last Bowel Movement   Last BM Date: 06/30/21 (Refused bowel protocol.)        Problem: Compromised Hemodynamic Status  Goal: Vital signs and fluid balance maintained/improved  Outcome: Progressing  Flowsheets (Taken 07/05/2021 0538 by Ferdie Ping, RN)  Vital signs and fluid balance are maintained/improved:   Position patient for maximum circulation/cardiac output   Monitor/assess lab values and report abnormal values     Problem: Impaired Mobility  Goal: Mobility/Activity is maintained at optimal level for patient  Outcome: Progressing  Flowsheets (Taken 07/05/2021 0538 by Ferdie Ping, RN)  Mobility/activity is maintained at optimal level for patient:   Increase mobility as tolerated/progressive mobility   Maintain proper body alignment   Reposition patient every 2 hours and as needed unless able to reposition self   Plan activities to conserve energy, plan rest periods   Encourage independent activity per ability   Assess for changes in respiratory status, level of consciousness and/or development of fatigue   Consult/collaborate with Physical Therapy and/or Occupational Therapy     Problem: Peripheral Neurovascular Impairment  Goal: Extremity color, movement, sensation are maintained or improved  Outcome: Progressing  Flowsheets (Taken  07/05/2021 0538 by Ferdie Ping, RN)  Extremity color, movement, sensation are maintained or improved:   Assess extremity for proper alignment   Teach/review/reinforce ankle pump exercises   VTE Prevention: Administer anticoagulant(s) and/or apply anti-embolism stockings/devices as ordered     Problem: Compromised skin integrity  Goal: Skin integrity is maintained or improved  Outcome: Progressing  Flowsheets (Taken 07/05/2021 0538 by Ferdie Ping, RN)  Skin integrity is maintained or improved:   Assess Braden Scale every shift   Avoid shearing   Keep skin clean and dry   Relieve pressure to bony prominences   Monitor patient's hygiene practices   Keep head of bed 30 degrees or less (unless contraindicated)     Problem: Pain interferes with ability to perform ADL  Goal: Pain at adequate level as identified by patient  Outcome: Progressing  Flowsheets (Taken 07/05/2021 0538 by Ferdie Ping, RN)  Pain at adequate level as identified by patient:   Identify patient comfort function goal   Assess for risk of opioid induced respiratory depression, including snoring/sleep apnea. Alert healthcare team of risk factors identified.   Assess pain  on admission, during daily assessment and/or before any "as needed" intervention(s)   Reassess pain within 30-60 minutes of any procedure/intervention, per Pain Assessment, Intervention, Reassessment (AIR) Cycle   Evaluate patient's satisfaction with pain management progress     Problem: Side Effects from Pain Analgesia  Goal: Patient will experience minimal side effects of analgesic therapy  Outcome: Progressing  Flowsheets (Taken 07/05/2021 0538 by Ferdie Ping, RN)  Patient will experience minimal side effects of analgesic therapy:   Monitor/assess patient's respiratory status (RR depth, effort, breath sounds)   Assess for changes in cognitive function     Problem: Moderate/High Fall Risk Score >5  Goal: Patient will remain free of falls  Outcome: Progressing  Flowsheets  Taken  07/06/2021 1940 by Fransico Michael, RN  VH Moderate Risk (6-13):   ALL REQUIRED LOW INTERVENTIONS   INITIATE YELLOW "FALL RISK" SIGNAGE   YELLOW "FALL RISK" ARM BAND   YELLOW NON-SKID SLIPPERS   PLACE FALL RISK LEVEL ON WHITE BOARD FOR COMMUNICATION PURPOSES IN PATIENT'S ROOM   USE OF BED EXIT ALARM IF PATIENT IS CONFUSED OR IMPULSIVE. PLACE RESET BED ALARM SIGN ABOVE BED  Taken 06/30/2021 0213 by Ferdie Ping, RN  Moderate Risk (6-13):   LOW-Fall Interventions Appropriate for Low Fall Risk   MOD-Floor mat at bedside (where available) if appropriate   MOD-Remain with patient during toileting   MOD-Place bedside commode and assistive devices out of sight when not in use   MOD-Apply bed exit alarm if patient is confused   MOD-Request PT/OT consult order for patients with gait/mobility impairment     Problem: Compromised Tissue integrity  Goal: Damaged tissue is healing and protected  Outcome: Progressing  Flowsheets (Taken 07/05/2021 0538 by Ferdie Ping, RN)  Damaged tissue is healing and protected:   Monitor/assess Braden scale every shift   Relieve pressure to bony prominences for patients at moderate and high risk   Avoid shearing injuries   Increase activity as tolerated/progressive mobility   Keep intact skin clean and dry   Monitor patient's hygiene practices  Goal: Nutritional status is improving  Outcome: Progressing  Flowsheets (Taken 07/05/2021 0538 by Ferdie Ping, RN)  Nutritional status is improving: Allow adequate time for meals

## 2021-07-07 ENCOUNTER — Inpatient Hospital Stay: Payer: PRIVATE HEALTH INSURANCE

## 2021-07-07 MED ORDER — IOHEXOL 350 MG/ML IV SOLN
100.0000 mL | Freq: Once | INTRAVENOUS | Status: AC | PRN
Start: 2021-07-07 — End: 2021-07-07
  Administered 2021-07-07: 11:00:00 100 mL via INTRAVENOUS

## 2021-07-07 NOTE — Progress Notes (Signed)
Medicine Progress Note   [facility]  [employer]   Patient Name: Clayton Davis, Clayton Davis LOS: 9 days   Attending Physician: Jenness Corner, MD PCP: Marisa Sprinkles, MD      Hospital Course:                                                            Cypress Fanfan is a 30 y.o. male patient with a past medical history of mood disorder, hyperlipidemia, opioid dependence on methadone, presents to the ED from home with chief complaint of worsening back pain.  MRI of the lumbar spine showed osteomyelitis-discitis at L4-L5.  He subsequently underwent aspiration of the L4 and L5 per IR.  He was then started on empiric IV antibiotics.  Culture grew Pseudomonas.  IV antibiotics is de-escalated to cefepime every 8 hourly.  He is followed by telemetry ID, pain management.     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 status post biopsy  Continue cefepime  Appreciate tele ID input  Plan to continue cefepime until 10/10 and switch to cipro with close OP follow up  CT of the R hip normal  Will discuss with ID prior to [place]    Opioid Dependence in the setting of acute pain  IVDU  Continue methadone/lyrica/skelaxin/oxycodone PRN  Pain mgt following    QTC prolongation  Resolved    Anemia of inflammation  HH stable    Disposition:   Anticipate last dose-discussed with pharmacy --last dose at 1400  Will discuss with tele ID tomorrow; anticipate [place] tomorrow  PT/OT pending--appeared to have decreased mobility advised poss transitional care unit    DVT PPX: Medication VTE Prophylaxis Orders: enoxaparin (LOVENOX) syringe 40 mg  Mechanical VTE Prophylaxis Orders: Mechanical VTE: Pneumatic Compression; Knee high  Code:  Full Code       Subjective     Asleep, woke up; discussed normal CT of the R hip           Objective   Physical Exam:       Vitals: T:98.4 F (36.9 C) (Temporal), BP:99/63, HR:61, RR:16, SaO2:93%         General: Patient is  awake. In no acute distress.  HEENT: No conjunctival drainage, vision is intact, anicteric sclera.  Neck: Supple, no thyromegaly.  Chest: CTA bilaterally. No rhonchi, no wheezing. No use of accessory muscles.  CVS: Normal rate and regular rhythm no murmurs, without JVD, no pitting edema, pulses palpable.  Abdomen: Soft, non-tender, no guarding or rigidity, with normal bowel sounds.  Extremities: No calf swelling and no gross deformity.LLE ROM intact, decreased ROM R hip due to pain  Skin: Warm, dry, no rash and no worrisome lesions.  NEURO: No motor or sensory deficits.  Psychiatric: Alert, interactive, appropriate, normal affect.    Weight Monitoring 08/16/2020 01/14/2021 05/17/2021 05/18/2021 05/26/2021 05/30/2021 06/28/2021   Height 180 cm 180.3 cm 180.3 cm 180.3 cm - 180.3 cm 180.3 cm   Height Method Stated - Stated Stated - Stated Stated   Weight 106 kg 105.325 kg 99.791 kg 93.8 kg 92.2 kg 92.2 kg 86.183 kg   Weight Method Standing Scale - Stated Actual Actual Stated Stated   BMI (calculated) 32.8 kg/m2 32.5 kg/m2 30.7  kg/m2 28.9 kg/m2 - 28.4 kg/m2 26.6 kg/m2           Intake/Output Summary (Last 24 hours) at 07/07/2021 1734  Last data filed at 07/07/2021 0543  Gross per 24 hour   Intake --   Output 700 ml   Net -700 ml       Body mass index is 26.5 kg/m.     Meds:     Current Facility-Administered Medications   Medication Dose Route Frequency    atorvastatin  40 mg Oral Daily    buPROPion SR  150 mg Oral Daily    busPIRone  15 mg Oral Q12H SCH    cefepime  2 g Intravenous Q8H    celecoxib  200 mg Oral BID    docusate sodium  200 mg Oral BID    enoxaparin  40 mg Subcutaneous Q24H    famotidine  20 mg Oral BID AC    FLUoxetine  10 mg Oral QAM    lactobacillus species  50 Billion CFU Oral Daily    lidocaine  1 patch Transdermal Q24H    metaxalone  800 mg Oral TID    methadone  35 mg Oral Q8H SCH    polyethylene glycol  17 g Oral Daily    pregabalin  50 mg Oral TID       PRN Meds: acetaminophen **OR** acetaminophen **OR**  acetaminophen, cloNIDine, LORazepam, naloxone, oxyCODONE, polyethylene glycol.     LABS:     Estimated Creatinine Clearance: 159.8 mL/min (A) (based on SCr of 0.72 mg/dL (L)).  Recent Labs   Lab 07/06/21  0424 07/05/21  0637   WBC 10.3 9.6   RBC 4.38 4.21   Hemoglobin 11.3* 10.8*   Hematocrit 35.6* 34.0*   MCV 81 81   PLT CT 439 442*               Lab Results   Component Value Date    HGBA1CPERCNT 5.3 01/23/2021     Recent Labs   Lab 07/06/21  0424 07/05/21  0637 07/04/21  0331   Glucose 98 105* 81   Sodium 138 139 138   Potassium 4.0 3.8 3.7   Chloride 103 104 103   CO2 25 26 23    BUN 12 13 13    Creatinine 0.72* 0.60* 0.64*   EGFR 126 133 131   Calcium 9.6 9.5 9.4       Recent Labs   Lab 07/06/21  0424 07/05/21  0637   Albumin 2.9* 2.8*   Protein, Total 7.3 7.2   Bilirubin, Total 0.4 0.3   Alkaline Phosphatase 54 58   ALT 48 51   AST (SGOT) 28 39             Invalid input(s):  AMORPHOUSUA   Patient Lines/Drains/Airways Status       Active PICC Line / CVC Line / PIV Line / Drain / Airway / Intraosseous Line / Epidural Line / ART Line / Line / Wound / Pressure Ulcer / NG/OG Tube       Name Placement date Placement time Site Days    PICC Double Lumen 07/03/21 Right Basilic 07/03/21  1421  Basilic  2                   CT Hip Right W WO Contrast    Result Date: 07/07/2021  Normal CT examination of the right hip. ReadingStation:[email or web address]    PICC Placement Double Lumen- VH    Result Date:  07/03/2021  IMPRESSION: Successful fluoroscopic and ultrasound guided PICC line placement. ReadingStation:[email or web address]    CT Guided Asp Disc or Paravertebral    Result Date: 07/02/2021  IMPRESSION: Successful percutaneous CT guided L4-L5 Disc Core Biopsy and Aspiration with moderate sedation. ReadingStation:[email or web address]    Home Health Needs:  There are no questions and answers to display.       Nutrition assessment done in collaboration with Registered Dietitians:     Time spent:      Jenness Corner, MD     07/07/21,5:34 PM   MRN: [id number]                                       CSN: [id number] DOB: 07-29-1991

## 2021-07-07 NOTE — OT Plan of Care Note (Signed)
[facility]  [facility]  [address]  [place] [address]  Department of Rehabilitation Services  [phone]    Apollos Tenbrink CSN: [id number]  [facility] [address] 4533/4533-A    Occupational Therapy General Note    Attempted to see pt for OT this AM. RN reported that patient will be leaving shortly for a CT scan and is sleepy this morning. Will attempt again later as schedule allows.     Team communication: RN Abran Duke    Plan: Will follow for OT evaluation.     Donell Sievert, OTR/L

## 2021-07-07 NOTE — Plan of Care (Addendum)
NURSE NOTE SUMMARY  [facility] [address]   Patient Name: Clayton Davis   Attending Physician: Jenness Corner, MD   Today's date:   07/07/2021 LOS: 9 days   Shift Summary:                                                              Assumed pt care. Assessment and vitals complete  PICC line intact INT  Pt voiding in urinal      Pt resting in bed, call bell within reach, family at bedside.     Report given to oncoming nurse.      Provider Notifications:        Rapid Response Notifications:  Mobility:      PMP Activity: Step 6 - Walks in Room (07/07/2021  7:53 PM)     Weight tracking:  Family Dynamic:   No data found.          Last Bowel Movement   Last BM Date: 06/30/21        Problem: Compromised Hemodynamic Status  Goal: Vital signs and fluid balance maintained/improved  Outcome: Progressing  Flowsheets (Taken 07/05/2021 0538 by Ferdie Ping, RN)  Vital signs and fluid balance are maintained/improved:   Position patient for maximum circulation/cardiac output   Monitor/assess lab values and report abnormal values     Problem: Impaired Mobility  Goal: Mobility/Activity is maintained at optimal level for patient  Outcome: Progressing  Flowsheets (Taken 07/05/2021 0538 by Ferdie Ping, RN)  Mobility/activity is maintained at optimal level for patient:   Increase mobility as tolerated/progressive mobility   Maintain proper body alignment   Reposition patient every 2 hours and as needed unless able to reposition self   Plan activities to conserve energy, plan rest periods   Encourage independent activity per ability   Assess for changes in respiratory status, level of consciousness and/or development of fatigue   Consult/collaborate with Physical Therapy and/or Occupational Therapy     Problem: Peripheral Neurovascular Impairment  Goal: Extremity color, movement, sensation are maintained or improved  Outcome: Progressing  Flowsheets (Taken 07/05/2021 0538 by Ferdie Ping, RN)  Extremity  color, movement, sensation are maintained or improved:   Assess extremity for proper alignment   Teach/review/reinforce ankle pump exercises   VTE Prevention: Administer anticoagulant(s) and/or apply anti-embolism stockings/devices as ordered     Problem: Compromised skin integrity  Goal: Skin integrity is maintained or improved  Outcome: Progressing  Flowsheets (Taken 07/05/2021 0538 by Ferdie Ping, RN)  Skin integrity is maintained or improved:   Assess Braden Scale every shift   Avoid shearing   Keep skin clean and dry   Relieve pressure to bony prominences   Monitor patient's hygiene practices   Keep head of bed 30 degrees or less (unless contraindicated)     Problem: Pain interferes with ability to perform ADL  Goal: Pain at adequate level as identified by patient  Outcome: Progressing  Flowsheets (Taken 07/05/2021 0538 by Ferdie Ping, RN)  Pain at adequate level as identified by patient:   Identify patient comfort function goal   Assess for risk of opioid induced respiratory depression, including snoring/sleep apnea. Alert healthcare team of risk factors identified.   Assess pain on admission, during daily  assessment and/or before any "as needed" intervention(s)   Reassess pain within 30-60 minutes of any procedure/intervention, per Pain Assessment, Intervention, Reassessment (AIR) Cycle   Evaluate patient's satisfaction with pain management progress     Problem: Side Effects from Pain Analgesia  Goal: Patient will experience minimal side effects of analgesic therapy  Outcome: Progressing  Flowsheets (Taken 07/05/2021 0538 by Ferdie Ping, RN)  Patient will experience minimal side effects of analgesic therapy:   Monitor/assess patient's respiratory status (RR depth, effort, breath sounds)   Assess for changes in cognitive function     Problem: Moderate/High Fall Risk Score >5  Goal: Patient will remain free of falls  Outcome: Progressing  Flowsheets  Taken 07/07/2021 1953 by Fransico Michael, RN  VH  Moderate Risk (6-13):   ALL REQUIRED LOW INTERVENTIONS   INITIATE YELLOW "FALL RISK" SIGNAGE   YELLOW NON-SKID SLIPPERS   USE OF BED EXIT ALARM IF PATIENT IS CONFUSED OR IMPULSIVE. PLACE RESET BED ALARM SIGN ABOVE BED   YELLOW "FALL RISK" ARM BAND   PLACE FALL RISK LEVEL ON WHITE BOARD FOR COMMUNICATION PURPOSES IN PATIENT'S ROOM  Taken 06/30/2021 0213 by Ferdie Ping, RN  Moderate Risk (6-13):   LOW-Fall Interventions Appropriate for Low Fall Risk   MOD-Floor mat at bedside (where available) if appropriate   MOD-Remain with patient during toileting   MOD-Place bedside commode and assistive devices out of sight when not in use   MOD-Apply bed exit alarm if patient is confused   MOD-Request PT/OT consult order for patients with gait/mobility impairment     Problem: Compromised Tissue integrity  Goal: Damaged tissue is healing and protected  Outcome: Progressing  Flowsheets (Taken 07/05/2021 0538 by Ferdie Ping, RN)  Damaged tissue is healing and protected:   Monitor/assess Braden scale every shift   Relieve pressure to bony prominences for patients at moderate and high risk   Avoid shearing injuries   Increase activity as tolerated/progressive mobility   Keep intact skin clean and dry   Monitor patient's hygiene practices  Goal: Nutritional status is improving  Outcome: Progressing  Flowsheets (Taken 07/05/2021 0538 by Ferdie Ping, RN)  Nutritional status is improving: Allow adequate time for meals

## 2021-07-07 NOTE — OT Plan of Care Note (Signed)
[facility]  [facility]  [address]  [place] [address]  Department of Rehabilitation Services  [phone]    Amirr Achord CSN: [id number]  [facility] [address] 4533/4533-A    Occupational Therapy General Note  Time: 1420    Attempted to see pt for OT this PM. Patient politely declined activity. Will continue to follow for OT evaluation.     Team communication: RN Abran Duke    Plan: Will continue to follow.     Donell Sievert, OTR/L

## 2021-07-08 MED ORDER — METAXALONE 800 MG PO TABS
400.0000 mg | ORAL_TABLET | Freq: Three times a day (TID) | ORAL | Status: DC
Start: 2021-07-08 — End: 2021-07-09
  Administered 2021-07-08 – 2021-07-09 (×3): 400 mg via ORAL
  Filled 2021-07-08 (×5): qty 1

## 2021-07-08 MED ORDER — CELECOXIB 100 MG PO CAPS
100.0000 mg | ORAL_CAPSULE | Freq: Two times a day (BID) | ORAL | Status: DC
Start: 2021-07-08 — End: 2021-07-09
  Administered 2021-07-08 – 2021-07-09 (×2): 100 mg via ORAL
  Filled 2021-07-08 (×2): qty 1

## 2021-07-08 MED ORDER — CIPROFLOXACIN HCL 750 MG PO TABS
750.0000 mg | ORAL_TABLET | Freq: Two times a day (BID) | ORAL | Status: DC
Start: 2021-07-08 — End: 2021-07-09
  Administered 2021-07-08 – 2021-07-09 (×3): 750 mg via ORAL
  Filled 2021-07-08 (×4): qty 1

## 2021-07-08 NOTE — Progress Notes (Signed)
Nutrition Therapy  Nutrition Assessment    Patient Information:     Name:Clayton Davis   Age: 30 y.o.   Sex: male     MRN: [id number]    Recommendation:     Continue regular diet as tolerated  Encourage PO intake >75%    Nutrition History:     Admitted w/ osteomyelitis. Hx of opioid dependence, HLD. Current recorded wt is stated wt. Wt seemed to be trending down in the past year, but non significant wt loss noted based on stated wt per wt hx.  Varied PO intake noted per chart, initially ate 100% of meals, but noted a couple of days with meal refusals in the past few days. Talked to nursing. Reported diminished appetite and intake d/t pt not wanting to use restroom for BM, likely d/t back pain.  Pt was asleep at the time of visit, talked to family member. Reported good appetite and intake similar to appetite and intake pta. Stated that pt typically does not have big appetite and usually eats 1-2 meals/day. Further stated that pt typically has protein source, likes milk, juice, pudding, etc. Denied any GI issues at this time, but per RN and chart, pt has not had any BM since 10/02. Encouraged good PO intake and to contact team if any ONS are desired.      Nutrition Focus Physical Findings: Normal    Nutrition Risk Level: Moderate    Nutrition Diagnosis:     Predicted Suboptimal Energy Intake related to current illness as evidenced by varied PO intake per chart        Monitoring:  Evaluation:    PO/EN/PN intake:  Total energy intake and Amount of food    Labs:  Electrolyte Profile, Renal Profile, and Glucose, casual    GI Profile:  Bowel Function   Nutrition Focused Physical:  Overall appearance and Skin       Assessment Data:     Admission Dx:  Osteomyelitis [M86.9]  Lumbar discitis [M46.46]  PMH:  has a past medical history of ADHD, Anxiety, Depression, No known health problems, and Sleep apnea.  PSH:  has a past surgical history that includes No past surgeries.     Height: 1.803 m (5\' 11" )   Weight: 86.2 kg (190  lb)   BMI: Body mass index is 26.5 kg/m.   IBW: 78.2 kg    Pertinent Meds:   Current Facility-Administered Medications   Medication Dose    atorvastatin  40 mg    buPROPion SR  150 mg    busPIRone  15 mg    celecoxib  100 mg    ciprofloxacin  750 mg    docusate sodium  200 mg    enoxaparin  40 mg    famotidine  20 mg    FLUoxetine  10 mg    lactobacillus species  50 Billion CFU    lidocaine  1 patch    metaxalone  800 mg    methadone  35 mg    polyethylene glycol  17 g    pregabalin  50 mg       Pertinent Labs:  Recent Labs   Lab 07/06/21  0424 07/05/21  0637 07/04/21  0331 07/03/21  0401 07/02/21  0359   Sodium 138 139 138 138 137   Potassium 4.0 3.8 3.7 3.5 3.7   Chloride 103 104 103 106 103   CO2 25 26 23 21  23.1   BUN 12 13 13 12  17  Creatinine 0.72* 0.60* 0.64* 0.67* 0.72*   Glucose 98 105* 81 76 78   Calcium 9.6 9.5 9.4 9.5 9.2   Osmolality Calculated 275 278 275 274* 274*          Diet Order:  Orders Placed This Encounter   Procedures    Diet regular        GI symptoms: distended, soft, last BM: 10/02  Hydration: non pitting generalized edema per chart  I/O:  reviewed; -3447.5 since admit  Skin: no PI noted per chart, Braden 19, nutrition 3      Food Security Issues:  No      Learning Needs:  No education needs at this time    Estimated Needs:  Estimated Energy Needs  Total Energy Estimated Needs: 1955-2346  Method for Estimating Needs: 25-30 kcal/kg IBW 78.2 kg  Estimated Protein Needs  Total Protein Estimated Needs: 94-117  Method for Estimating Needs: 1.2-1.5 g/kg IBW 78.2 kg       Additional Comments:       Montey Hora, RD  07/08/2021 7:07 AM

## 2021-07-08 NOTE — Plan of Care (Addendum)
NURSE NOTE SUMMARY  [facility] [address]   Patient Name: Clayton Davis   Attending Physician: Jenness Corner, MD   Today's date:   07/08/2021 LOS: 10 days   Shift Summary:                                                              Pt is alert and oriented x4.    Pt receiving scheduled and prn meds for pain this shift.  Rapid called at change of shift this morning, see their note.  Pt has further been stable on room air this shift with cont pulse ox in place.  Pt using IS and able to pull 2500.    PT/OT worked with pt today and he was able to ambulate in the room with supervision.    PICC line intact to RUE.    Last BM for pt 10/2, pt initially refusing laxatives and stool softeners but has agreed to taking them.    Pt switched to PO Cipro and planned for [place] tomorrow pending repeat EKG and QTC level.       Provider Notifications:        Rapid Response Notifications:  Mobility:      PMP Activity: Step 6 - Walks in Room (07/08/2021  1:55 PM)     Weight tracking:  Family Dynamic:   No data found.          Last Bowel Movement   Last BM Date: 06/30/21        Problem: Compromised Hemodynamic Status  Goal: Vital signs and fluid balance maintained/improved  Outcome: Progressing  Flowsheets (Taken 07/08/2021 1110)  Vital signs and fluid balance are maintained/improved:   Monitor/assess vitals and hemodynamic parameters with position changes   Monitor intake and output. Notify LIP if urine output is less than 30 mL/hour.   Monitor/assess lab values and report abnormal values     Problem: Impaired Mobility  Goal: Mobility/Activity is maintained at optimal level for patient  Outcome: Progressing  Flowsheets (Taken 07/08/2021 1110)  Mobility/activity is maintained at optimal level for patient:   Plan activities to conserve energy, plan rest periods   Reposition patient every 2 hours and as needed unless able to reposition self   Assess for changes in respiratory status, level of consciousness and/or  development of fatigue   Consult/collaborate with Physical Therapy and/or Occupational Therapy     Problem: Peripheral Neurovascular Impairment  Goal: Extremity color, movement, sensation are maintained or improved  Outcome: Progressing  Flowsheets (Taken 07/08/2021 1110)  Extremity color, movement, sensation are maintained or improved:   Increase mobility as tolerated/progressive mobility   Assess and monitor application of corrective devices (cast, brace, splint), check skin integrity   Assess extremity for proper alignment   Teach/review/reinforce ankle pump exercises     Problem: Compromised skin integrity  Goal: Skin integrity is maintained or improved  Outcome: Progressing  Flowsheets (Taken 07/08/2021 1110)  Skin integrity is maintained or improved:   Assess Braden Scale every shift   Turn or reposition patient every 2 hours or as needed unless able to reposition self   Increase activity as tolerated/progressive mobility   Keep skin clean and dry   Encourage use of lotion/moisturizer on skin   Monitor patient's hygiene  practices     Problem: Pain interferes with ability to perform ADL  Goal: Pain at adequate level as identified by patient  Outcome: Progressing  Flowsheets (Taken 07/08/2021 1110)  Pain at adequate level as identified by patient:   Identify patient comfort function goal   Reassess pain within 30-60 minutes of any procedure/intervention, per Pain Assessment, Intervention, Reassessment (AIR) Cycle   Evaluate if patient comfort function goal is met   Consult/collaborate with Physical Therapy, Occupational Therapy, and/or Speech Therapy   Include patient/patient care companion in decisions related to pain management as needed     Problem: Side Effects from Pain Analgesia  Goal: Patient will experience minimal side effects of analgesic therapy  Outcome: Progressing  Flowsheets (Taken 07/08/2021 1110)  Patient will experience minimal side effects of analgesic therapy: Monitor/assess patient's  respiratory status (RR depth, effort, breath sounds)     Problem: Moderate/High Fall Risk Score >5  Goal: Patient will remain free of falls  Outcome: Progressing  Flowsheets (Taken 07/08/2021 1107)  VH Moderate Risk (6-13):   ALL REQUIRED LOW INTERVENTIONS   INITIATE YELLOW "FALL RISK" SIGNAGE   YELLOW NON-SKID SLIPPERS   YELLOW "FALL RISK" ARM BAND   Request PT/OT therapy consult order from Physician for patients with gait/mobility impairment   Use assistive devices   Use chair-pad alarm device     Problem: Compromised Tissue integrity  Goal: Damaged tissue is healing and protected  Outcome: Progressing  Flowsheets (Taken 07/08/2021 1110)  Damaged tissue is healing and protected:   Monitor/assess Braden scale every shift   Provide wound care per wound care algorithm   Reposition patient every 2 hours and as needed unless able to reposition self   Increase activity as tolerated/progressive mobility   Keep intact skin clean and dry   Use bath wipes, not soap and water, for daily bathing   Encourage use of lotion/moisturizer on skin  Goal: Nutritional status is improving  Outcome: Progressing  Flowsheets (Taken 07/08/2021 1110)  Nutritional status is improving:   Allow adequate time for meals   Include patient/patient care companion in decisions related to nutrition

## 2021-07-08 NOTE — Nursing Progress Note (Signed)
Notified Dr. Scheryl Marten of expired tele order, pt in NSR. Pt denies any chest pain. Ordered to D/C order.

## 2021-07-08 NOTE — Progress Notes (Signed)
RRT notified of increased O2 needs, questioning O2 monitoring    Assessed at the bedside, pt is Aox4, very talkative and alert. Primary RN placed pt on 3L NC. Lung sounds are clear, pt states he has been using his incentive spirometer and coughing hurts d/t spinal issues. Pt had two O2 monitors on each hand, while talking with him, his O2 sats were anywhere from 91-98% with a good pleth on the screen. Pt had no complaints of shortness of breath or pain, no signs of distress. Pt states he does have sleep apnea but is claustrophobic so he does not tolerate the mask. Decrease in O2 on the monitor is most likely related to movement and cold fingers.    Recommend weaning O2 as tolerated.     Staff to call as needs arise.  Wilhelmenia Blase, RN, BSN, RRT

## 2021-07-08 NOTE — Progress Notes (Signed)
Medicine Progress Note   [facility]  [employer]   Patient Name: Clayton Davis, Clayton Davis LOS: 10 days   Attending Physician: Jenness Corner, MD PCP: Marisa Sprinkles, MD      Hospital Course:                                                            Yuan Gann is a 30 y.o. male patient with a past medical history of mood disorder, hyperlipidemia, opioid dependence on methadone, presents to the ED from home with chief complaint of worsening back pain.  MRI of the lumbar spine showed osteomyelitis-discitis at L4-L5.  He subsequently underwent aspiration of the L4 and L5 per IR.  He was then started on empiric IV antibiotics.  Culture grew Pseudomonas.  IV antibiotics is de-escalated to cefepime every 8 hourly.  He is followed by telemetry ID, pain management.     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 status post biopsy  Discussed with ID, will transition to po cipro today and obtain ECG to follow prolonged QTc    Opioid Dependence in the setting of acute pain  IVDU  Continue methadone/lyrica/skelaxin/oxycodone PRN  Pain mgt following    QTC prolongation  Resolved    Anemia of inflammation  HH stable    Disposition:   Safe dispo pending PT/OT--anticipate [place] tomorrow pending repeat ECG    DVT PPX: Medication VTE Prophylaxis Orders: enoxaparin (LOVENOX) syringe 40 mg  Mechanical VTE Prophylaxis Orders: Mechanical VTE: Pneumatic Compression; Knee high  Code:  Full Code       Subjective     Patient was asleep, wife int he room  He states that he walked with PT yesterday with a walker, he is anticipating to go home soon           Objective   Physical Exam:       Vitals: T:98.1 F (36.7 C) (Temporal), BP:102/59, HR:78, RR:17, SaO2:95%         General: Patient is awake. In no acute distress.  HEENT: No conjunctival drainage, vision is intact, anicteric sclera.  Neck: Supple, no thyromegaly.  Chest: CTA bilaterally. No  rhonchi, no wheezing. No use of accessory muscles.  CVS: Normal rate and regular rhythm no murmurs, without JVD, no pitting edema, pulses palpable.  Abdomen: Soft, non-tender, no guarding or rigidity, with normal bowel sounds.  Extremities: No calf swelling and no gross deformity.LLE ROM intact, decreased ROM R hip due to pain  Skin: Warm, dry, no rash and no worrisome lesions.  NEURO: No motor or sensory deficits.  Psychiatric: Alert, interactive, appropriate, normal affect.    Weight Monitoring 08/16/2020 01/14/2021 05/17/2021 05/18/2021 05/26/2021 05/30/2021 06/28/2021   Height 180 cm 180.3 cm 180.3 cm 180.3 cm - 180.3 cm 180.3 cm   Height Method Stated - Stated Stated - Stated Stated   Weight 106 kg 105.325 kg 99.791 kg 93.8 kg 92.2 kg 92.2 kg 86.183 kg   Weight Method Standing Scale - Stated Actual Actual Stated Stated   BMI (calculated) 32.8 kg/m2 32.5 kg/m2 30.7 kg/m2 28.9 kg/m2 - 28.4 kg/m2 26.6 kg/m2           Intake/Output Summary (Last 24 hours) at 07/08/2021 1711  Last  data filed at 07/07/2021 2225  Gross per 24 hour   Intake --   Output 725 ml   Net -725 ml       Body mass index is 26.5 kg/m.     Meds:     Current Facility-Administered Medications   Medication Dose Route Frequency    atorvastatin  40 mg Oral Daily    buPROPion SR  150 mg Oral Daily    busPIRone  15 mg Oral Q12H SCH    celecoxib  100 mg Oral BID    ciprofloxacin  750 mg Oral Q12H SCH    docusate sodium  200 mg Oral BID    enoxaparin  40 mg Subcutaneous Q24H    famotidine  20 mg Oral BID AC    FLUoxetine  10 mg Oral QAM    lactobacillus species  50 Billion CFU Oral Daily    lidocaine  1 patch Transdermal Q24H    metaxalone  800 mg Oral TID    methadone  35 mg Oral Q8H SCH    polyethylene glycol  17 g Oral Daily    pregabalin  50 mg Oral TID       PRN Meds: acetaminophen **OR** acetaminophen **OR** acetaminophen, cloNIDine, LORazepam, naloxone, oxyCODONE, polyethylene glycol.     LABS:     Estimated Creatinine Clearance: 159.8 mL/min (A) (based  on SCr of 0.72 mg/dL (L)).  Recent Labs   Lab 07/06/21  0424 07/05/21  0637   WBC 10.3 9.6   RBC 4.38 4.21   Hemoglobin 11.3* 10.8*   Hematocrit 35.6* 34.0*   MCV 81 81   PLT CT 439 442*               Lab Results   Component Value Date    HGBA1CPERCNT 5.3 01/23/2021     Recent Labs   Lab 07/06/21  0424 07/05/21  0637 07/04/21  0331   Glucose 98 105* 81   Sodium 138 139 138   Potassium 4.0 3.8 3.7   Chloride 103 104 103   CO2 25 26 23    BUN 12 13 13    Creatinine 0.72* 0.60* 0.64*   EGFR 126 133 131   Calcium 9.6 9.5 9.4       Recent Labs   Lab 07/06/21  0424 07/05/21  0637   Albumin 2.9* 2.8*   Protein, Total 7.3 7.2   Bilirubin, Total 0.4 0.3   Alkaline Phosphatase 54 58   ALT 48 51   AST (SGOT) 28 39             Invalid input(s):  AMORPHOUSUA   Patient Lines/Drains/Airways Status       Active PICC Line / CVC Line / PIV Line / Drain / Airway / Intraosseous Line / Epidural Line / ART Line / Line / Wound / Pressure Ulcer / NG/OG Tube       Name Placement date Placement time Site Days    PICC Double Lumen 07/03/21 Right Basilic 07/03/21  1421  Basilic  2                   CT Hip Right W WO Contrast    Result Date: 07/07/2021  Normal CT examination of the right hip. ReadingStation:[email or web address]    PICC Placement Double Lumen- VH    Result Date: 07/03/2021  IMPRESSION: Successful fluoroscopic and ultrasound guided PICC line placement. ReadingStation:[email or web address]    Home Health Needs:  There are no questions and answers to display.  Nutrition assessment done in collaboration with Registered Dietitians:     Time spent:      Jenness Corner, MD     07/08/21,5:11 PM   MRN: [id number]                                      CSN: [id number] DOB: [birth year]

## 2021-07-08 NOTE — OT Eval Note (Signed)
[facility]   Patient: Clayton Davis     CSN: [id number]    Bed: 4533/4533-A  Occupational Therapy EVALUATION                                                  Visit#: 1  Treatment Frequency: OT Frequency Recommended: 3-4x/wk    DISCHARGE RECOMMENDATIONS:   DME recommended for Discharge:   TBD closer to d/c  TBD at next level of care    Discharge Recommendations:   Other(Comment) (TCU/swing bed)  Pending medical progress  Pending progress in next therapy session    Additional Recommendations or Precautions:   Precautions, Contraindications, Awareness details:   Falls  Spinal Precautions (no bending, lifting or twisting) for pain    Recommend client perform self-cares and BSC transfers with FWW and assist x1, outside of and in addition to OT session.    OT Assessment and Plan of Care:     HPI (per physician charting) and Pertinent Medical Details:  Clayton Davis is a 30 y.o. male admitted 06/28/2021 presenting with worsening back pain. 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 Hx of opiod dependence/pain management, ATC prolongation (not new, but needs monitoring), anemia, Hep C ab positive, HLD, mood disorder/anxiety.    OT Assessment:  The patient's noted medical issues are resulting in impairments with decreased ROM, decreased strength, balance deficits, decreased activity tolerance, and fall risk. These impairments are affecting the patient's ability to perform in needed/desired occupations resulting in performance deficits in bathing/showering, toileting & toilet hygiene, dressing, functional mobility/transfers, and personal hygiene & grooming. Would benefit from continued occupational therapy services for above noted impairments.       Goals:  In 2 to 4 visits:  1. Patient will donn/doff socks with Minimal assist and use of AE/AT prn. NEW  2. Patient will thread pants/undergarments over knees with Minimal assist and use of AE/AT/AD prn.  NEW  3. Patient will stand and arrange pants/undergarments over hips with Minimal assist and use of AE/AT/AD prn.  NEW  4. Patient will donn/doff shirt/gown with Modified independence  and use of AT/AE prn. NEW  5. Patient will perform BSC/chair transfer with Minimal assist and use of AD/DME prn. NEW  6. Patient will perform toilet transfer with Minimal assist and use of AD/DME prn. NEW    LTG (in 5 to 8 visits):   1. Patient will be Supervision for ADLs, Supervision for functional transfers/mobility with use of AD/DME/AT/AE. NEW     Rehabilitation Potential: Good With continued OT s/p acute discharge With family     Risks/benefits/POC discussed: Patient, Spouse/Significant other    Treatment/interventions: ADL retraining, functional transfer training, patient/family training, equipment eval/education, compensatory technique education    Medical & Therapy History:   Medical Diagnosis: Osteomyelitis [M86.9]  Lumbar discitis [M46.46]    Discussed with patient/family/caregiver the patient's physical, cognitive and/or psychosocial history related to current functional performance: yes    Previous therapy services: Unknown    Patient Active Problem List   Diagnosis    History of pulmonary embolism    History of COVID-19    Class 1 obesity with body mass index (BMI) of 32.0 to 32.9 in adult, unspecified obesity type, unspecified whether serious comorbidity present    Anxiety    Depression, unspecified depression type  Osteomyelitis    Lumbar discitis    Acute osteomyelitis of lumbar spine    IV drug abuse        Past Medical/Surgical History:  Past Medical History:   Diagnosis Date    ADHD     Anxiety     Depression     No known health problems     Sleep apnea       Past Surgical History:   Procedure Laterality Date    NO PAST SURGERIES         Occupational Profile:   Information per Patient, spouse:     Home Living Arrangements  Living Arrangements: Spouse/significant other  Assistance Available: Part time, normally  works 9-5 job, but can set up to work from home if patient is discharged home, so could have full time support of one person  Type of Home: House  Home Layout: Split Foyer  Bathroom:  standard toilet;  walk-in-shower, built-in shower seat  DME Currently at Home:   None    Prior Level of Function  Mobility: Community ambulation  Fall History: None.     Activities of Daily Living  Patient was independent with all ADLs    Instrumental Activities of Daily Living  Patient was independent with all IADLs.    Patient/caregiver goal for OT: To return to PLOF.     Patient/caregiver concerns & priorities: Pain.     Subjective:   "It hurts."  Patient is agreeable to participation in the therapy session. Nursing clears patient for therapy.    Pain:  At Rest: 8 /10  With Activity: 8/10  Location:  Back  Interventions: Repositioned, Rest    ASSESSMENT OF OCCUPATIONAL PERFORMANCE:   Observation of Patient:    Patient is in bed with O2 via NC, continuous pulse oximeter     Vital Signs:   HR with activity: 123 bpm decreasing to 112 with rest.      Oriented to: Oriented x4  Command following: Follows ALL commands and directions without difficulty  Alertness/Arousal: Appropriate responses to stimuli   Attention Span:Appears intact    Musculoskeletal Examination:   Range of motion:  Bilateral UE: Grossly WFL       Strength:  Bilateral UE: Grossly WFL    Sensory/Oculomotor Examination:   Auditory:  WFL=intact    Activities of Daily Living:   Eating:  Independent, Simulated;   Grooming: Supervision/Set up, Simulated  UB dressing:  Supervision/Set up, Simulated  LB dressing: Maximal assist, Simulated  Bathing:  Moderate assist, Simulated;  Toileting:  Moderate assist, Simulated;    Functional Mobility:   Bed Mobility:  Supine to Sit:   Minimal assist.   assist at LE(s); Cues given for log-roll technique.   Sit to Supine:   Minimal assist.   Cues for Log rolling., Cues for Sequencing., Cues for Hand placement., assist at  LE(s)    Transfers:  Sit to Stand:  Supervision with Front wheeled walker.   Cues for Sequencing, Cues for Hand Placement  Stand to Sit:  Supervision.   Cues for Sequencing, Cues for Hand Placement  Side-stepping to the L along bedside: Supervision with FWW.     Balance:   Static Sitting:  WFL  Static Standing:  Good    Participation and Activity Tolerance   Participation effort: Good  Activity Tolerance: Limited by pain and increased HR.     Other Treatment Interventions:   Treatment Activities:   Evaluation.      Education Provided:  Topics: Role of occupational therapy, plan of care, goals of therapy and safety with mobility and ADLs, spine precautions, bed mobility with use of adaptive equipment or strategy.    Individuals educated: Patient, Spouse/Significant other.  Method: Explanation.  Response to education: Verbalized understanding.    Team Communication:   OT communicated with: RN/LPN -    OT communicated regarding: Pre-session re: patient status, Patient position at end of session, Patient participation with Therapy  OT/COTA communication: via written note and verbal communication as needed.    Patient Position at End of Treatment:   Supine, in bed, Family/visitors present, Needs in reach, and No distress    Time of treatment:   Time Calculation  OT Received On: 07/08/21  Start Time: 1021  Stop Time: 1042  Time Calculation (min): 21 min    Leonie Douglas, OT, OTR/L

## 2021-07-08 NOTE — PT Progress Note (Signed)
[facility]: [facility]  Patient: Clayton Davis     CSN: [id number]    Bed: 4533/4533-A  Physical Therapy PROGRESS note   Visit#: 2   Treatment Frequency: 3-4x/wk  Last seen by a physical therapist vs. Physical therapist assistant: 07/08/2021    DISCHARGE RECOMMENDATIONS   Discharge Recommendations:   Other(Comment) (Inpatient Rehab)       *Discharge recommendations are subject to change based on patient's progress and/or home support changes - please refer to most recent PT note for current recommendation    DME recommended for Discharge:   Front wheeled walker (Adult)    PMP (Progressive Mobility Program) Recommendations:   Recommend patient  ambulate 2-3 times/day with Wheeled walker and physical assist and/or supervision of 1 staff, out of bed to chair for meals, walk into bathroom as tolerated.     Precautions and Contraindications:   Spinal Precautions (no bending, lifting or twisting)  Falls  Mobility protocol     PT Assessment and Plan of Care (Treatment frequency noted above):   HPI (per physician charting) and Pertinent Medical Details:    Admitted 06/28/2021 with/for worsening back pain. 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 Hx of opiod dependence/pain management, ATC prolongation (not new, but needs monitoring), anemia, Hep C ab positive, HLD, mood disorder/anxiety    Goals:    STG (5-10 visits)  1. Patient will be independent with bed mobility to improve functional mobility and progress with out of bed mobility. ONGOING    2. Patient will be Modified Independent with sit to stand transfers using least restrictive device to progress with out of bed mobility. ONGOING    3. Patient will be Modified Independent with chair/bed transfer using least restrictive device to improve functional mobility and activity tolerance. ONGOING    4. Patient will ambulate 50 ft independent assistance using least restrictive device to  improve functional mobility and activity tolerance. ONGOING    5. Patient will be able to complete HEP consisting of at least 4 exercises to maintain strength and progress out of bed mobility. ONGOING        PT goals will be updated based on progress while admitted in [facility] and with Plan of care.        PT Assessment:  Patient's progress towards established goals: PT is progressing in goials as he is abel to transfer from supine ot sit EOB with CGA and ambulate 6ft in the room with use of FWW with CGA. Pt states he is very painful and demonstrates a slow gait pattern with decreased ground clearance ob BLE but with verbal cues pt is able to normalize gait pattern for about 4 steps at a time. PT recommends inpatient rehab upon D/C.      Treatment plan: Continue plan of care     Subjective:   " I appreciate you helping me."      Patient/family/caregiver consent to therapy session is noted by the participation in the therapy session.    Pain:  At Rest: 8 /10  With Activity: 9/10  Location: Back:  Lower, Middle  Interventions: Repositioned, RN/LPN aware, Rest    OBJECTIVE:   Observation of Patient/Vital Signs:   Patient is in bed with no medical equipment  Patient's medical condition is appropriate for Physical therapy intervention at this time.    Vital Signs:  Stable with no signs/symptoms of distress           Oriented to: Oriented x4  Command following: Follows ALL commands and directions without difficulty  Alertness/Arousal: Appropriate responses to stimuli   Attention Span:Appears intact    Musculoskeletal and Balance Details:                Bed Mobility:   Supine to Sit:   CGA.   Cues for Log rolling., Cues for hooklying position. , Cues for Sequencing., Cues for Hand placement.  Sit to Supine:   CGA.   Cues for Log rolling., Cues for hooklying position. , Cues for Sequencing., Cues for Hand placement., HOB flat, Bed rail used  Seated Scooting:   Independent    Transfers:  Sit to Stand:  CGA with Front wheeled  walker.    Cues for Sequencing, Cues for Hand Placement, Cues for Foot Placement, Cues for Walker Management  Stand to Sit:  Supervision.    Cues for Sequencing, Cues for Hand Placement, Cues for Foot Placement, Cues for Walker Management    Locomotion:  LEVEL AMBULATION:  Distance: 50 ft    Assistance level:  CGA  Device:  Front wheeled walker  Pattern:  Step through, Narrow base of support, Decreased cadence, Decreased clearance:  bilaterally        Other Treatment Interventions this session:   Therapeutic activity     Education Provided:   TOPICS: role of physical therapy, plan of care, goals of therapy and safety with mobility and ADLs, benefits of activity, home safety, use of adaptive equipment, bed mobility with use of adaptive equipment or strategy    Learner educated: Patient, Family  Method: Explanation  Response to education: Verbalized understanding and Needs reinforcement    Patient Position at End of Treatment:   Supine, in bed, in the room, Needs in reach, Bed/chair alarm set, and No distress    Team Communication:   Spoke to : RN/LPN   Regarding: Pre-session re: patient status, Patient position at end of session, Discharge needs, Patient participation with Therapy, Progressive Mobility Program recommendations  Whiteboard updated: No  PT/PTA communication: via written note and verbal communication as needed.    Time of treatment:   Time Calculation  PT Received On: 07/08/21  Start Time: 1355  Stop Time: 1422  Time Calculation (min): 27 min    Pearletha Forge, PT,DPT

## 2021-07-08 NOTE — Progress Notes (Signed)
[facility] PAIN MANAGEMENT PROGRESS NOTE    Subjective   Patient reports that he is doing better overall, but did have a very painful timeframe after PT this morning. He states he has been UOOB and ambulating in the room and hallway. He remains constipated, with last Sunday 8 days ago .      Objectives:     Vitals:    07/08/21 0710   BP: 104/71   Pulse: 95   Resp: 18   Temp: 99.1 F (37.3 C)   SpO2: 90%     General appearance -bed resting, awake, somewhat sedate initially but fully awakens with conversation, no acute distress    Mental status - oriented to person, place, and time, recall of today's events intact  CV - RRR, no murmur or gallop  Chest - clear, no wheezes or rales  Abdomen - soft, mildly distended, non-tender  Neurological - no headache or back pain.  BLE sensory grossly intact.  Musculoskeletal - lower back pain radiating to bilateral hips, R>L, motor function/strength intact    Extremities -peripheral pulses normal, no pedal edema, no cyanosis  Skin -pale coloration, no rashes.  Psych - pleasant, converse well, good eye contact.    Labs/Imaging:  No results for input(s): WBC, HGB, HCT in the last 24 hours.    Invalid input(s):  PLT  No results for input(s): PT, INR, PTT in the last 24 hours.  No results for input(s): BUN, CREAT, K, AST, ALT, ALP in the last 24 hours.    Invalid input(s): BILIT    QTc interval 07/06/2021 at 462     Assessment/Plan   30 yr old male with:  Acute low back pain secondary to osteomyelitis/discitis at L4-5 - improving  Opioid-induced constipation - expected side effect, chronic per patient  Opioid Use Disorder, on Methadone 145 mg daily, follows with [facility] (rx bottle verified and current. Dr. Patsi Sears), recent relapse with IV cocaine    Multimorbidities: ADHD, Anxiety/depression, hx of PE/COVID infection fall 2021      - start tapering celecoxib to 100 mg bid and metaxalone to 400 mg q 8 hours.  - no changes to other analgesics at this point.  - patient to take miralax along with  docusate tonight. He may need PAMORA or edema if not evacuating stool with conservative bowel regimen. Patient plans to take above meds and declined other interventions.    Cleatis Polka DNP  07/08/2021

## 2021-07-09 LAB — BASIC METABOLIC PANEL
Anion Gap: 13 mMol/L (ref 7.0–18.0)
BUN / Creatinine Ratio: 32.3 Ratio — ABNORMAL HIGH (ref 10.0–30.0)
BUN: 21 mg/dL (ref 7–22)
CO2: 25.1 mMol/L (ref 20.0–30.0)
Calcium: 9.5 mg/dL (ref 8.5–10.5)
Chloride: 102 mMol/L (ref 98–110)
Creatinine: 0.65 mg/dL — ABNORMAL LOW (ref 0.80–1.30)
EGFR: 129 mL/min/{1.73_m2} (ref 60–150)
Glucose: 114 mg/dL — ABNORMAL HIGH (ref 71–99)
Osmolality Calculated: 276 mOsm/kg (ref 275–300)
Potassium: 4.1 mMol/L (ref 3.5–5.3)
Sodium: 136 mMol/L (ref 136–147)

## 2021-07-09 MED ORDER — PREGABALIN 50 MG PO CAPS
50.0000 mg | ORAL_CAPSULE | Freq: Three times a day (TID) | ORAL | 0 refills | Status: AC
Start: 2021-07-09 — End: ?

## 2021-07-09 MED ORDER — FLUOXETINE HCL 10 MG PO CAPS
10.0000 mg | ORAL_CAPSULE | Freq: Every morning | ORAL | 0 refills | Status: AC
Start: 2021-07-09 — End: ?

## 2021-07-09 MED ORDER — CIPROFLOXACIN HCL 750 MG PO TABS
750.0000 mg | ORAL_TABLET | Freq: Two times a day (BID) | ORAL | 0 refills | Status: DC
Start: 2021-07-09 — End: 2021-08-06

## 2021-07-09 MED ORDER — CELECOXIB 100 MG PO CAPS
100.0000 mg | ORAL_CAPSULE | Freq: Two times a day (BID) | ORAL | 0 refills | Status: AC
Start: 2021-07-09 — End: ?

## 2021-07-09 MED ORDER — VH BIO-K PLUS PROBIOTIC 50 BIL CFU CAPSULE
50.0000 | DELAYED_RELEASE_CAPSULE | Freq: Every day | ORAL | 1 refills | Status: AC
Start: 2021-07-10 — End: ?

## 2021-07-09 MED ORDER — DSS 100 MG PO CAPS
200.0000 mg | ORAL_CAPSULE | Freq: Two times a day (BID) | ORAL | 0 refills | Status: AC
Start: 2021-07-09 — End: ?

## 2021-07-09 MED ORDER — FAMOTIDINE 20 MG PO TABS
20.0000 mg | ORAL_TABLET | Freq: Two times a day (BID) | ORAL | 0 refills | Status: AC
Start: 2021-07-09 — End: ?

## 2021-07-09 MED ORDER — METAXALONE 800 MG PO TABS
400.0000 mg | ORAL_TABLET | Freq: Three times a day (TID) | ORAL | 0 refills | Status: AC
Start: 2021-07-09 — End: ?

## 2021-07-09 MED ORDER — POLYETHYLENE GLYCOL 3350 17 G PO PACK
17.0000 g | PACK | Freq: Every day | ORAL | 0 refills | Status: AC | PRN
Start: 2021-07-09 — End: ?

## 2021-07-09 NOTE — UM Notes (Signed)
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 status post biopsy  Discussed with ID, will transition to po cipro today and obtain ECG to follow prolonged QTc     Opioid Dependence in the setting of acute pain  IVDU  Continue methadone/lyrica/skelaxin/oxycodone PRN  Pain mgt following     QTC prolongation  Resolved     Anemia of inflammation  HH stable      CONTINUES ON THE MEDICAL TELE  FLOOR  PAIN CONTROL     TELE   VS Q 8 HR    PT/OT    Recent Labs   Lab 07/06/21  0424   WBC 10.3   Hemoglobin 11.3*   Hematocrit 35.6*   PLT CT 439          Recent Labs   Lab 07/09/21  0724   Sodium 136   Potassium 4.1   Chloride 102   CO2 25.1   BUN 21   Creatinine 0.65*   EGFR 129   Glucose 114*   Calcium 9.5          VITALS: Blood pressure 90/68, pulse 75, temperature 97.5 F (36.4 C), temperature source Temporal, resp. rate 16, height 1.803 m (5\' 11" ), weight 86.2 kg (190 lb), SpO2 95 %.      Scheduled Meds:  Current Facility-Administered Medications   Medication Dose Route Frequency    atorvastatin  40 mg Oral Daily    buPROPion SR  150 mg Oral Daily    busPIRone  15 mg Oral Q12H SCH    celecoxib  100 mg Oral BID    ciprofloxacin  750 mg Oral Q12H SCH    docusate sodium  200 mg Oral BID    enoxaparin  40 mg Subcutaneous Q24H    famotidine  20 mg Oral BID AC    FLUoxetine  10 mg Oral QAM    lactobacillus species  50 Billion CFU Oral Daily    lidocaine  1 patch Transdermal Q24H    metaxalone  400 mg Oral TID    methadone  35 mg Oral Q8H SCH    polyethylene glycol  17 g Oral Daily    pregabalin  50 mg Oral TID     Continuous Infusions:  PRN Meds:.acetaminophen **OR** acetaminophen **OR** acetaminophen, cloNIDine, LORazepam, naloxone, oxyCODONE, polyethylene glycol            [name] BSN, RN  Utilization Management Review Nurse  [facility] [address]  [address]  [place], [place] [address]  [phone] office Phone, Direct and Confidential  [phone], Fax  [email or web address]

## 2021-07-09 NOTE — OT Plan of Care Note (Signed)
[facility]:  [facility]   Patient: Clayton Davis     CSN: [id number]    Bed: 4533/4533-A  Occupational Therapy PROGRESS note      Visit#: 2  Treatment Frequency: OT Frequency Recommended: 3-4x/wk    DISCHARGE RECOMMENDATIONS:   DME recommended for Discharge:   FWW    Discharge Recommendations:   Home with supervision;Home with home health OT       Additional Recommendations or Precautions   Precautions, Contraindications, Awareness details:   Falls  Spinal Precautions (no bending, lifting or twisting) for pain     Recommend client perform self-cares and BSC transfers with FWW and assist x1, outside of and in addition to OT session.    OT Assessment and Plan of Care:     HPI (per physician charting) and Pertinent Medical Details:  Clayton Davis is a 30 y.o. male admitted 06/28/2021 presenting with  worsening back pain. 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 Hx of opiod dependence/pain management, ATC prolongation (not new, but needs monitoring), anemia, Hep C ab positive, HLD, mood disorder/anxiety.    OT Assessment:  Patient's progress towards established goals: Pt met 1 goal this session and is demonstrating increased activity tolerance. Pt performed bed mobility with supervision and transferred into the chair with supervision. Pt will have assist as needed at home. OT will continue to follow pt per goals.      Goals:  In 2 to 4 visits:  1. Patient will donn/doff socks with Minimal assist and use of AE/AT prn. ONGOING  2. Patient will thread pants/undergarments over knees with Minimal assist and use of AE/AT/AD prn. ONGOING  3. Patient will stand and arrange pants/undergarments over hips with Minimal assist and use of AE/AT/AD prn. ONGOING  4. Patient will donn/doff shirt/gown with Modified independence  and use of AT/AE prn. ONGOING  5. Patient will perform BSC/chair transfer with Minimal assist and use of AD/DME prn.  MET 07/09/2021  6. Patient will perform toilet transfer with Minimal assist and use of AD/DME prn. ONGOING     LTG (in 5 to 8 visits):   1. Patient will be Supervision for ADLs, Supervision for functional transfers/mobility with use of AD/DME/AT/AE. ONGOING    Patient will continue to benefit from skilled OT services in order to address deficits above.      Treatment/interventions: Continue plan of care     Subjective:   "I walked in the hallway and to the window yesterday."  Patient is agreeable to participation in the therapy session. Nursing clears patient for therapy.    Pain:  At Rest: 4, per FACES  /10  With Activity: 6, per FACES /10  Location:  Back, lower, middle.  Interventions: Repositioned, Rest       Observation of Patient:    Patient is in bed with no medical equipment.     Vital Signs:   Stable with no signs/symptoms of distress     Command following: Follows ALL commands and directions without difficulty  Alertness/Arousal: Appropriate responses to stimuli     Activities of Daily Living:   Not performed this session.     Functional Mobility:   Bed Mobility:  Supine to Sit:   Supervision.   Via log-roll technique.     Transfers:  Sit to Stand:  Supervision with Front wheeled walker.   Cues for Sequencing, Cues for Hand Placement  Stand to Sit:  Supervision.   Cues for Sequencing, Cues  for Hand Placement  Chair: Supervision with Front wheeled walker.   Cues for Sequencing, Cues for Hand Placement    Other Treatment Interventions:   Treatment Activities:   Therapeutic Activity.     Education Provided:   Topics: Role of occupational therapy, plan of care, goals of therapy and safety with mobility and ADLs, spine precautions, bed mobility with use of adaptive equipment or strategy.    Individuals educated: Patient, Spouse/Significant other, Family.  Method: Explanation.  Response to education: Verbalized understanding.    Team Communication:   Spoke to: RN/LPN -    Regarding: Pre-session re: patient  status, Patient position at end of session, Patient participation with Therapy  OT/COTA communication: via written note and verbal communication as needed.    Patient Position at End of Treatment:   Sitting, in a chair, Family/visitors present, Needs in reach, and No distress    Time of treatment:   Time Calculation  OT Received On: 07/09/21  Start Time: 1350  Stop Time: 1414  Time Calculation (min): 24 min    Leonie Douglas, OT, OTR/L

## 2021-07-09 NOTE — Discharge Instr - AVS First Page (Signed)
Use your assistive device when up  Take your antibiotics as directed.  Follow all instructions as given by the doctors and occupational/physical therapy  Call with any and all concerns or symptoms not normal for you

## 2021-07-09 NOTE — Plan of Care (Addendum)
NURSE NOTE SUMMARY  [facility] - [facility] [address]   Patient Name: Clayton Davis   Attending Physician: Jenness Corner, MD   Today's date:   07/09/2021 LOS: 11 days   Shift Summary:                                                              0700- Received report, assumed care. Patient drowsy, but arousable at this time. Assessment complete, PICC intact with blood return noted. Neurovascular intact.     EKG completed  Worked with therapy  Voiding WNL    PICC line removed, pressure applied, and petroleum gauze dressing. No drainage present on dressing.       1730- Patient discharged home with family. All discharge instructions gone over and discussed. All questions/concerns addressed. Patient sent with [facility] and walker from [facility]. Transport called, patient left the floor. End of care.     BP 96/55   Pulse 68   Temp 97.5 F (36.4 C) (Temporal)   Resp 16   Ht 1.803 m (5\' 11" )   Wt 86.2 kg (190 lb)   SpO2 98%   BMI 26.50 kg/m        Provider Notifications:        Rapid Response Notifications:  Mobility:      PMP Activity: Step 6 - Walks in Room (07/09/2021  7:50 AM)     Weight tracking:  Family Dynamic:   No data found.          Last Bowel Movement   Last BM Date: 06/30/21        Problem: Compromised Hemodynamic Status  Goal: Vital signs and fluid balance maintained/improved  Outcome: Progressing  Flowsheets (Taken 07/09/2021 0316 by Ferdie Ping, RN)  Vital signs and fluid balance are maintained/improved:   Position patient for maximum circulation/cardiac output   Monitor/assess vitals and hemodynamic parameters with position changes   Monitor and compare daily weight   Monitor intake and output. Notify LIP if urine output is less than 30 mL/hour.   Monitor/assess lab values and report abnormal values     Problem: Impaired Mobility  Goal: Mobility/Activity is maintained at optimal level for patient  Outcome: Progressing  Flowsheets (Taken 07/09/2021 0316 by Ferdie Ping,  RN)  Mobility/activity is maintained at optimal level for patient:   Increase mobility as tolerated/progressive mobility   Encourage independent activity per ability   Maintain proper body alignment   Plan activities to conserve energy, plan rest periods   Reposition patient every 2 hours and as needed unless able to reposition self   Assess for changes in respiratory status, level of consciousness and/or development of fatigue     Problem: Peripheral Neurovascular Impairment  Goal: Extremity color, movement, sensation are maintained or improved  Outcome: Progressing  Flowsheets (Taken 07/09/2021 0316 by Ferdie Ping, RN)  Extremity color, movement, sensation are maintained or improved:   Increase mobility as tolerated/progressive mobility   Assess and monitor application of corrective devices (cast, brace, splint), check skin integrity   Assess extremity for proper alignment   Teach/review/reinforce ankle pump exercises     Problem: Compromised skin integrity  Goal: Skin integrity is maintained or improved  Outcome: Progressing  Flowsheets (Taken 07/09/2021 0316 by Rollene Rotunda  F, RN)  Skin integrity is maintained or improved:   Assess Braden Scale every shift   Turn or reposition patient every 2 hours or as needed unless able to reposition self   Increase activity as tolerated/progressive mobility   Relieve pressure to bony prominences   Avoid shearing   Keep skin clean and dry   Encourage use of lotion/moisturizer on skin   Keep head of bed 30 degrees or less (unless contraindicated)     Problem: Pain interferes with ability to perform ADL  Goal: Pain at adequate level as identified by patient  Outcome: Progressing  Flowsheets (Taken 07/09/2021 0316 by Ferdie Ping, RN)  Pain at adequate level as identified by patient:   Assess pain on admission, during daily assessment and/or before any "as needed" intervention(s)   Reassess pain within 30-60 minutes of any procedure/intervention, per Pain Assessment,  Intervention, Reassessment (AIR) Cycle   Evaluate if patient comfort function goal is met   Evaluate patient's satisfaction with pain management progress     Problem: Side Effects from Pain Analgesia  Goal: Patient will experience minimal side effects of analgesic therapy  Outcome: Progressing  Flowsheets (Taken 07/09/2021 0316 by Ferdie Ping, RN)  Patient will experience minimal side effects of analgesic therapy:   Monitor/assess patient's respiratory status (RR depth, effort, breath sounds)   Assess for changes in cognitive function   Evaluate for opioid-induced sedation with appropriate assessment tool (i.e. POSS)     Problem: Moderate/High Fall Risk Score >5  Goal: Patient will remain free of falls  Outcome: Progressing  Flowsheets (Taken 07/09/2021 0750)  VH Moderate Risk (6-13):   ALL REQUIRED LOW INTERVENTIONS   INITIATE YELLOW "FALL RISK" SIGNAGE   YELLOW NON-SKID SLIPPERS   YELLOW "FALL RISK" ARM BAND   USE OF BED EXIT ALARM IF PATIENT IS CONFUSED OR IMPULSIVE. PLACE RESET BED ALARM SIGN ABOVE BED   PLACE FALL RISK LEVEL ON WHITE BOARD FOR COMMUNICATION PURPOSES IN PATIENT'S ROOM   Request PT/OT therapy consult order from Physician for patients with gait/mobility impairment   Use assistive devices   Use chair-pad alarm device     Problem: Compromised Tissue integrity  Goal: Damaged tissue is healing and protected  Outcome: Progressing  Flowsheets (Taken 07/09/2021 0316 by Ferdie Ping, RN)  Damaged tissue is healing and protected:   Monitor/assess Braden scale every shift   Reposition patient every 2 hours and as needed unless able to reposition self   Increase activity as tolerated/progressive mobility   Relieve pressure to bony prominences for patients at moderate and high risk   Avoid shearing injuries   Keep intact skin clean and dry   Use incontinence wipes for cleaning urine, stool and caustic drainage. Foley care as needed

## 2021-07-09 NOTE — Progress Notes (Signed)
Quick Doc  [facility] [address]   Patient Name: Roselyn Bering   Attending Physician: Jenness Corner, MD   Today's date:   07/09/2021 LOS: 11 days   Expected Discharge Date      Quick  Assessment:                                                              ReAdmit Risk Score: 18.02    CM Comments: 07/09/21 MSW JKK -DISCHARGE NOTE - SW met with patient at bedside to discuss  planning. Patient alert and oriented. Patietnt would like to return home with family support. Patient's spouse will be available and willing to provide supervsion and care when patient returns home. SW offered [facility] education. Patient declined [facility] services, stating that he knows exactly what PT/OT would ask him to do if they came to his home. Patient states he knows how to "get up and go" without PT/OT telling him to do that. Patient in agreement with [facility] and FWW as recommended by OT. Referrals submitted to [facility]. DME to be delivered to patient's bedside prior to discharge. SW following      Provider Notifications:      Pauline Aus, LMSW  Discharge Planner/Social Worker  Tel: [phone]

## 2021-07-09 NOTE — Teleconsult (Addendum)
Infectious Disease E-Consult Tele-ID Progress Note - [facility]  [facility]   Patient Name: Clayton Davis   Attending Physician: Clayton Corner, MD   Primary Care Physician: Pcp, None, MD     Visit Information:   Patient was not seen.    Subjective  "This patient recommendation is based on a telemedicine consult request which was completed asynchronously through chart review and information provided by the primary physician. The patient was not seen or examined today. The evaluation is consultative in nature and all patient care and treatment decisions can either be accepted or rejected by the patient's primary hospital-based treating physician using their own independent medical judgement for their patient."    AF and VSS  Transitioned to cipro 750 mg BID and EKG ordered for today  Pain in back and legs much improved per hospitalist.     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 IR guided biopsy done 10/3 with cultures with Pseudomonas aeruginosa.      Treated with 1 week of cefepime prior to transitioning to ciprofloxacin and has stayed in the hospital for Qtc monitoring today after transitioning yesterday.   Pseudomonas does have a lower barrier to resistance to cipro but given his past IVDU an oral treatment is the best choice for him. Follow-up imaging is not always needed for spinal osteodiscitis, but given the organism, his chronic pain and social history, I would consider it in this case with the expectation of mild improvement or no change as radiographic resolution will take much longer than 6 weeks. This decision can be made in ID clinic follow-up. If inflammatory markers normalize, this would be reassuring.      #L4-L5 discitis/osteomyelitis, intramuscular involvement of medial Bl psoas muscles s/p biopsy with growth of Pseudomonas  aeruginosa  #SUD on methadone, recent IVDU  #Hep C Ab reactive, VL not detected     Recommendation  - Continue ciprofloxacin 750 mg BID  - Check Qtc today, this should be under 500 while on his home meds  - Follow-up cultures and histopath  - Low threshold to repeat MRI L spine at end of therapy if any concern  - Will order repeat CRP/sed rate today  - Recommend ID clinic follow-up in 1-2 weeks for Qtc monitoring.   - Recommend weekly cbc w diff, CMP and CRP while on therapy.     Please be sure to discuss with Clayton Davis that he should not use methamphetamines while on cipro due to Qtc concerns. I attempted to call his room on multiple occasions today and was unsuccessful in reaching him. Discussed with Dr. Sandria Manly who will ensure he understands this risk and is in agreement with abstaining from any meth use.   If this is a problem, he will need inpatient IV cefepime for the duration of his course.       ID will sign off.    Maeola Sarah, MD  Clinical Assistant Professor  [facility]  Division of Infectious Diseases  [facility]  [facility] Call Center: [phone]  Pager: Tele ID 7 Service  Pager: [phone] (Personal)       Microbiology:  9/30 blood x2 NGTD  10/3 aspiration cultures GS negative, growth of Pseudomonas aeruginosa  Pseudomonas aeruginosa      BACTERIAL SUSCEPTIBILITY MIC (MCG/ML)    Cefepime  4  Susceptible    Ciprofloxacin <=0.25  Susceptible    Gentamicin <=1  Susceptible    Imipenem 2  Susceptible    Levofloxacin 2  Intermediate    Pip/Tazo 16  Susceptible    Tobramycin <=1  Susceptible      Fungal and AFB smears negative, pending    Surgical path pending    Inpatient Medications      Objective   Vitals: T:97.5 F (36.4 C) (Temporal),  BP:90/68, HR:75, RR:16, SaO2:95%,FiO2-O2 (L/m): , Dosing Wt:  Wt Readings from Last 1 Encounters:   06/28/21 86.2 kg (190 lb)   ,   ZOX:WRUE mass index is 26.5 kg/m.    Patient was not seen or examined.         Results Review    Labs:  Estimated Creatinine  Clearance: 175.4 mL/min (A) (based on SCr of 0.65 mg/dL (L)).  Recent Labs   Lab 07/06/21  0424 07/05/21  0637   WBC 10.3 9.6   RBC 4.38 4.21   Hemoglobin 11.3* 10.8*   Hematocrit 35.6* 34.0*   MCV 81 81   PLT CT 439 442*                 Lab Results   Component Value Date    HGBA1CPERCNT 5.3 01/23/2021     Recent Labs   Lab 07/09/21  0724 07/06/21  0424 07/05/21  0637   Glucose 114* 98 105*   Sodium 136 138 139   Potassium 4.1 4.0 3.8   Chloride 102 103 104   CO2 25.1 25 26    BUN 21 12 13    Creatinine 0.65* 0.72* 0.60*   EGFR 129 126 133   Calcium 9.5 9.6 9.5       Recent Labs   Lab 07/06/21  0424 07/05/21  0637   Albumin 2.9* 2.8*   Protein, Total 7.3 7.2   Bilirubin, Total 0.4 0.3   Alkaline Phosphatase 54 58   ALT 48 51   AST (SGOT) 28 39             Invalid input(s):  AMORPHOUSUA      CT Hip Right W WO Contrast    Result Date: 07/07/2021  Normal CT examination of the right hip. ReadingStation:[id number]    PICC Placement Double Lumen- VH    Result Date: 07/03/2021  IMPRESSION: Successful fluoroscopic and ultrasound guided PICC line placement. ReadingStation:[id number]   Baldo Daub, MD  07/09/21 9:10 AM  MRN: [id number]                                     CSN: [id number]     I spent 15 minutes on chart review and documentation.

## 2021-07-09 NOTE — Plan of Care (Addendum)
NURSE NOTE SUMMARY  [facility] [address]   Patient Name: Clayton Davis   Attending Physician: Jenness Corner, MD   Today's date:   07/09/2021 LOS: 11 days   Shift Summary:                                                              1900:Took over patient care observed patient in bed fully awake A/O x 4.  Family member at bed side, assisting patient with meals, from home. Patient   Reports back pain 6/10. Repositioned for comfort. Encouraged to repositioned self for comfort. Made aware of next prn pain meds time. Double Lumen PICC line to RUA intact.  2115:prn pain oxycodone  given, continuous pulse ox in place. Patient .  2300: patient on oxygen 2 liter for comfort during sleep , hx of sleep apnea, refusing c-pap during this hospital stay. Vitals stable.  0600: unable to draw blood from RUA picc line for lab, able to flushed,blood return sluggish, RN notified in report.   Provider Notifications:        Rapid Response Notifications:  Mobility:      PMP Activity: Step 3 - Bed Mobility (07/08/2021  8:30 PM)     Weight tracking:  Family Dynamic:   No data found.          Last Bowel Movement   Last BM Date: 06/30/21        Problem: Compromised Hemodynamic Status  Goal: Vital signs and fluid balance maintained/improved  Outcome: Progressing  Flowsheets (Taken 07/09/2021 0316)  Vital signs and fluid balance are maintained/improved:   Position patient for maximum circulation/cardiac output   Monitor/assess vitals and hemodynamic parameters with position changes   Monitor and compare daily weight   Monitor intake and output. Notify LIP if urine output is less than 30 mL/hour.   Monitor/assess lab values and report abnormal values     Problem: Impaired Mobility  Goal: Mobility/Activity is maintained at optimal level for patient  Outcome: Progressing  Flowsheets (Taken 07/09/2021 0316)  Mobility/activity is maintained at optimal level for patient:   Increase mobility as tolerated/progressive  mobility   Encourage independent activity per ability   Maintain proper body alignment   Plan activities to conserve energy, plan rest periods   Reposition patient every 2 hours and as needed unless able to reposition self   Assess for changes in respiratory status, level of consciousness and/or development of fatigue     Problem: Peripheral Neurovascular Impairment  Goal: Extremity color, movement, sensation are maintained or improved  Outcome: Progressing  Flowsheets (Taken 07/09/2021 0316)  Extremity color, movement, sensation are maintained or improved:   Increase mobility as tolerated/progressive mobility   Assess and monitor application of corrective devices (cast, brace, splint), check skin integrity   Assess extremity for proper alignment   Teach/review/reinforce ankle pump exercises     Problem: Compromised skin integrity  Goal: Skin integrity is maintained or improved  Outcome: Progressing  Flowsheets (Taken 07/09/2021 0316)  Skin integrity is maintained or improved:   Assess Braden Scale every shift   Turn or reposition patient every 2 hours or as needed unless able to reposition self   Increase activity as tolerated/progressive mobility   Relieve pressure to bony prominences  Avoid shearing   Keep skin clean and dry   Encourage use of lotion/moisturizer on skin   Keep head of bed 30 degrees or less (unless contraindicated)     Problem: Pain interferes with ability to perform ADL  Goal: Pain at adequate level as identified by patient  Outcome: Progressing  Flowsheets (Taken 07/09/2021 0316)  Pain at adequate level as identified by patient:   Assess pain on admission, during daily assessment and/or before any "as needed" intervention(s)   Reassess pain within 30-60 minutes of any procedure/intervention, per Pain Assessment, Intervention, Reassessment (AIR) Cycle   Evaluate if patient comfort function goal is met   Evaluate patient's satisfaction with pain management progress     Problem: Side Effects from  Pain Analgesia  Goal: Patient will experience minimal side effects of analgesic therapy  Outcome: Progressing  Flowsheets (Taken 07/09/2021 0316)  Patient will experience minimal side effects of analgesic therapy:   Monitor/assess patient's respiratory status (RR depth, effort, breath sounds)   Assess for changes in cognitive function   Evaluate for opioid-induced sedation with appropriate assessment tool (i.e. POSS)     Problem: Moderate/High Fall Risk Score >5  Goal: Patient will remain free of falls  Outcome: Progressing  Flowsheets  Taken 07/08/2021 2030  VH Moderate Risk (6-13):   ALL REQUIRED LOW INTERVENTIONS   YELLOW NON-SKID SLIPPERS   YELLOW "FALL RISK" ARM BAND   USE OF BED EXIT ALARM IF PATIENT IS CONFUSED OR IMPULSIVE. PLACE RESET BED ALARM SIGN ABOVE BED   Include family/significant other in multidisciplinary discussion regarding plan of care as appropriate   Remain with patient during toileting   Request PT/OT therapy consult order from Physician for patients with gait/mobility impairment   Use assistive devices  Taken 06/30/2021 0213  Moderate Risk (6-13):   LOW-Fall Interventions Appropriate for Low Fall Risk   MOD-Floor mat at bedside (where available) if appropriate   MOD-Remain with patient during toileting   MOD-Place bedside commode and assistive devices out of sight when not in use   MOD-Apply bed exit alarm if patient is confused   MOD-Request PT/OT consult order for patients with gait/mobility impairment     Problem: Compromised Tissue integrity  Goal: Damaged tissue is healing and protected  Outcome: Progressing  Flowsheets (Taken 07/09/2021 0316)  Damaged tissue is healing and protected:   Monitor/assess Braden scale every shift   Reposition patient every 2 hours and as needed unless able to reposition self   Increase activity as tolerated/progressive mobility   Relieve pressure to bony prominences for patients at moderate and high risk   Avoid shearing injuries   Keep intact skin clean and  dry   Use incontinence wipes for cleaning urine, stool and caustic drainage. Foley care as needed  Goal: Nutritional status is improving  Outcome: Progressing  Flowsheets (Taken 07/09/2021 0316)  Nutritional status is improving: Allow adequate time for meals

## 2021-07-09 NOTE — Progress Notes (Signed)
[facility] PAIN MANAGEMENT PROGRESS NOTE    Subjective   Patient reports sleeping better but also continues to feel sleepy. He has not taken any oxycodone since last night at around 9:00 and on reduced dose of methadone and metaxalone. He remains painful as expected but much improved.  He is also able to function better.  He continues to be constipated (9 days), which is apparently his normal.     Patient states that he generally takes his methadone every 3 days instead of everyday and that he is on taper schedule per his clinic, stating further that taking his divided doses here (at total 35 mg daily dose) may be too much for him. He also states that he takes liquid methadone dose that he can measure up to 0.5 mg dose if needed.    On oral abx now and possibly discharge later today.    QTc normal interval today.    Objectives:     Vitals:    07/09/21 0706   BP: 90/68   Pulse: 75   Resp: 16   Temp: 97.5 F (36.4 C)   SpO2: 95%     General appearance -bed resting, sleeping/easily arousable/maintains conversation, in no distress.  Mental status -oriented to person, place, and time  Abdomen - mildly distended, nontender, no nv    Neurological - no tremors, no myoclonus,  BLE sensory grossly intact.  Musculoskeletal - low back pain (improving).  BLE gross motor intact.  Extremities -peripheral pulses normal, no pedal edema, no cyanosis  Skin -pale coloration, warm, dry  Psych - pleasant, drowsy, good eye contact.    Labs/Imaging:  No results for input(s): WBC, HGB, HCT in the last 24 hours.    Invalid input(s):  PLT  No results for input(s): PT, INR, PTT in the last 24 hours.  Recent Labs     07/09/21  0724   BUN 21   Creatinine 0.65*   Potassium 4.1        Assessment/Plan   30 yr old male with:  Acute low back pain secondary to osteomyelitis/discitis at L4-5 - improving  Opioid-induced constipation - expected side effect, chronic per patient  Opioid Use Disorder, on Methadone 145 mg daily, follows with [facility] (rx bottle  verified and current. Dr. Patsi Sears), recent relapse with IV cocaine    Multimorbidities: ADHD, Anxiety/depression, hx of PE/COVID infection fall 2021       - decrease pregabalin to 25 mg tid given sedation and improving pain and function, with goal of discontinuation after discharge.  - continue metaxalone and celecoxib, taper off as he continues to heal.  - I had a long discussion with patient regarding his methadone dose, that he takes his current dose of 35 mg tid as ordered in the hospital setting and NOT to go back to 145 mg daily dose because QTc prolongation could recur that can lead to cardiac arrhythmia, esp with abx tx.  - I advised patient (his grandma present) that he needs to follow-up with his methadone clinic as soon as possible to re-calibrate his dose of methadone for safety reasons.  - Bowel regimen discussed, patient adamant that being constipated for 9 days is his normal when taking his methadone. He is on colace and miralax, which he had declined many times. He also declined enema and PAMORA injectable.    Cleatis Polka DNP  07/09/2021

## 2021-07-09 NOTE — Discharge Summary (Signed)
Medicine Discharge Summary   [facility]  [employer]   Patient Name: Clayton Davis   Attending Physician: No att. 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 male patient that was admitted on 06/28/2021 with a past medical history of mood disorder, hyperlipidemia, opioid dependence on methadone, presents to the ED from home with chief complaint of worsening back pain.  MRI of the lumbar spine showed osteomyelitis-discitis at L4-L5.  He subsequently underwent aspiration of the L4 and L5 per IR.  He was then started on empiric IV antibiotics.  Culture grew Pseudomonas.  IV antibiotics is de-escalated to cefepime every 8 hourly.  He is transitioned to cipro 750 mg BID, to follow as OP with repeat ECG to follow QTc. He is advised not to use methamphetamine as this will interact with cipro and may prolong QTc.       Pending Results and other significant studies:       Discharge Instructions:          Disposition: Home  Diet: Regular Diet  Activity: As tolerated  Discharge Code Status: Prior    [facility]  [address]  [place] [address]  [phone]  Follow up  Oct. 13 @ 2:00  will need ECG follow up QTc    Tele [facility] Infectious disease    Follow up in 1 week(s)       Discharge Medications:                                                                        Discharge Medication List        Taking      acetaminophen 325 MG tablet  Dose: 650 mg  Commonly known as: TYLENOL  Take 2 tablets (650 mg total) by mouth every 6 (six) hours as needed for Pain     atorvastatin 40 MG tablet  Dose: 40 mg  Commonly known as: Lipitor  Take  1 tablet (40 mg total) by mouth daily     buPROPion XL 150 MG 24 hr tablet  Commonly known as: WELLBUTRIN XL  TAKE 1 TABLET BY MOUTH EVERY DAY     busPIRone 5 MG tablet  Dose: 15 mg  Commonly known as: BUSPAR  Take 15 mg by mouth 2 (two) times daily      celecoxib 100 MG capsule  Dose: 100 mg  Commonly known as: CeleBREX  Take 1 capsule (100 mg total) by mouth 2 (two) times daily     ciprofloxacin 750 MG tablet  Dose: 750 mg  Commonly known as: CIPRO  Take 1 tablet (750 mg total) by mouth every 12 (twelve) hours     docusate sodium 100 MG capsule  Dose: 200 mg  Commonly known as: COLACE  Take 2 capsules (200 mg total) by mouth 2 (two) times daily  famotidine 20 MG tablet  Dose: 20 mg  Commonly known as: PEPCID  Take 1 tablet (20 mg total) by mouth 2 (two) times daily before meals     FLUoxetine 10 MG capsule  Dose: 10 mg  What changed: how much to take  Commonly known as: PROzac  Take 1 capsule (10 mg total) by mouth every morning     lactobacillus species Cpdr capsule  Dose: 50 Billion CFU  Commonly known as: BIO-K PLUS  Start taking on: July 10, 2021  Take 1 capsule (50 Billion CFU total) by mouth daily     lidocaine 5 %  Dose: 1 patch  Commonly known as: LIDODERM  Place 1 patch onto the skin every 24 hours Remove & Discard patch within 12 hours or as directed by MD     metaxalone 800 MG tablet  Dose: 400 mg  Commonly known as: SKELAXIN  Take 0.5 tablets (400 mg total) by mouth 3 (three) times daily     polyethylene glycol 17 g packet  Dose: 17 g  Commonly known as: MIRALAX  Take 17 g by mouth daily as needed (constipation)     pregabalin 50 MG capsule  Dose: 50 mg  Commonly known as: LYRICA  Take 1 capsule (50 mg total) by mouth 3 (three) times daily            STOP taking these medications      amphetamine-dextroamphetamine 15 MG tablet  Commonly known as: ADDERALL     butalbital-acetaminophen-caffeine 50-325-40 MG per tablet  Commonly known as: Esgic     clonazePAM 0.5 MG tablet  Commonly known as:  KlonoPIN     guaiFENesin 600 MG 12 hr tablet  Commonly known as: Mucinex     HYDROcodone-acetaminophen 5-325 MG per tablet  Commonly known as: NORCO     hydrOXYzine 25 MG tablet  Commonly known as: ATARAX     ibuprofen 600 MG tablet  Commonly known as: ADVIL     ibuprofen 800 MG tablet  Commonly known as: ADVIL     methylPREDNISolone 4 MG tablet  Commonly known as: MEDROL DOSEPAK     naloxone 4 MG/0.1ML nasal spray  Commonly known as: NARCAN     ondansetron 8 MG disintegrating tablet  Commonly known as: Zofran ODT               Discharge Day Exam (07/09/2021):     Blood pressure 96/55, pulse 68, temperature 97.5 F (36.4 C), temperature source Temporal, resp. rate 16, height 1.803 m (5\' 11" ), weight 86.2 kg (190 lb), SpO2 98 %.      General: Patient is awake. In no acute distress.  Chest: CTA bilaterally. No rhonchi, no wheezing. No use of accessory muscles.  CVS: Normal rate and regular rhythm no murmurs, without JVD.  Abdomen: Soft, non-tender, no guarding or rigidity, with normal bowel sounds.  Extremities: No pitting edema, pulses palpable, no calf swelling and no gross deformity.  Skin: Warm, dry  NEURO: No motor or sensory deficits.     Recent Labs      Recent Labs   Lab 07/06/21  0424 07/05/21  0637 07/04/21  0331 07/03/21  0401   WBC 10.3 9.6 11.4* 11.7*   RBC 4.38 4.21 4.17 4.30   Hemoglobin 11.3* 10.8* 10.8* 11.1*   Hematocrit 35.6* 34.0* 33.6* 34.8*   MCV 81 81 81 81   PLT CT 439 442* 416 415  Lab Results   Component Value Date    HGBA1CPERCNT 5.3 01/23/2021     Recent Labs   Lab 07/09/21  0724 07/06/21  0424 07/05/21  0637 07/04/21  0331 07/03/21  0401   Glucose 114* 98 105* 81 76   Sodium 136 138 139 138 138   Potassium 4.1 4.0 3.8 3.7 3.5   Chloride 102 103 104 103 106   CO2 25.1 25 26 23 21    BUN 21 12 13 13 12    Creatinine 0.65* 0.72* 0.60* 0.64* 0.67*   EGFR 129 126 133 131 129   Calcium 9.5 9.6 9.5 9.4 9.5     Recent Labs   Lab 07/06/21  0424 07/05/21  0637 07/04/21  0331 07/03/21  0401    Albumin 2.9* 2.8* 2.7* 2.7*   Protein, Total 7.3 7.2 7.0 7.0   Bilirubin, Total 0.4 0.3 0.4 0.4   Alkaline Phosphatase 54 58 57 51   ALT 48 51 49 48   AST (SGOT) 28 39 49* 56*        Allergies:      Quetiapine   Time spent on discharging the patient:  45 minutes   CT Hip Right W WO Contrast    Result Date: 07/07/2021  Normal CT examination of the right hip. ReadingStation:[email or web address]    PICC Placement Double Lumen- [facility]    Result Date: 07/03/2021  IMPRESSION: Successful fluoroscopic and ultrasound guided PICC line placement. ReadingStation:[email or web address]    Home Health Needs:  There are no questions and answers to display.      Jenness Corner, MD         07/09/21 11:21 PM   MRN: [id number]                                      CSN: [id number] DOB: [birth year]

## 2021-07-10 LAB — ECG 12-LEAD
P Wave Axis: 58 deg
P-R Interval: 152 ms
Patient Age: 30 years
Q-T Interval(Corrected): 429 ms
Q-T Interval: 400 ms
QRS Axis: 36 deg
QRS Duration: 97 ms
T Axis: 19 years
Ventricular Rate: 69 //min

## 2021-07-11 ENCOUNTER — Ambulatory Visit (INDEPENDENT_AMBULATORY_CARE_PROVIDER_SITE_OTHER): Payer: PRIVATE HEALTH INSURANCE | Admitting: Internal Medicine

## 2021-07-17 NOTE — Progress Notes (Signed)
[facility]   [facility] [address] [facility]  [address]  [place] [address]  Dept: [phone]  Dept Fax: [phone]     Visit Date: 07/18/2021    CC:   1. Osteomyelitis, unspecified site, unspecified type        2. Lumbar discitis        3. Anxiety        4. Depression, unspecified depression type        5. Hospital discharge follow-up        6. QT prolongation  ECG 12 lead           HISTORY OF PRESENT ILLNESS  Clayton Davis is 30 y.o. male who [facility] recent hospitalization where patient was admitted on 06/28/21 and discharged on 07/09/21     He was treated for back pain w/ MRI indicating OM/discitis at L4-L5. He had aspiration w/ IR and put on IV abx and ID consult. He grew pseudomonas treated w/ cefepime every 8 hourly and than transitioned to cipro 750 mg BID.     He is doing well w/ no complaints today.    Patient understands that today's visit is a follow up from recent hospitalization and that our services will provide a bridge in medical care until patient can be established with primary care.     The primary encounter diagnosis was Osteomyelitis, unspecified site, unspecified type. Diagnoses of Lumbar discitis, Anxiety, Depression, unspecified depression type, Hospital discharge follow-up, and QT prolongation were also pertinent to this visit.      Past Medical History:   Diagnosis Date    ADHD     Anxiety     Depression     No known health problems     Sleep apnea      Past Surgical History:   Procedure Laterality Date    NO PAST SURGERIES       Family History   Problem Relation Age of Onset    No known problems Mother     No known problems Father     No known problems Sister     No known problems Brother     No known problems Maternal Aunt     No known problems Maternal Uncle     No known problems Paternal Aunt     No known problems Paternal Uncle     Cancer Maternal Grandmother     Diabetes Maternal Grandfather      No known problems Paternal Grandmother     No known problems Paternal Grandfather      Social History     Socioeconomic History    Marital status: Married   Tobacco Use    Smoking status: Former     Years: 10.00     Types: Cigarettes     Quit date: 10/12/2016     Years since quitting: 4.7    Smokeless tobacco: Never    Tobacco comments:     social   Vaping Use    Vaping Use: Every day   Substance and Sexual Activity    Alcohol use: Not Currently     Comment: Socially x once a month    Drug use: Not Currently    Sexual activity: Yes     Allergies   Allergen Reactions    Quetiapine Hives       Current Outpatient Medications:     atorvastatin (Lipitor) 40 MG  tablet, Take 1 tablet (40 mg total) by mouth daily, Disp: 90 tablet, Rfl: 3    buPROPion XL (WELLBUTRIN XL) 150 MG 24 hr tablet, TAKE 1 TABLET BY MOUTH EVERY DAY, Disp: 90 tablet, Rfl: 1    celecoxib (CeleBREX) 100 MG capsule, Take 1 capsule (100 mg total) by mouth 2 (two) times daily, Disp: 60 capsule, Rfl: 0    ciprofloxacin (CIPRO) 750 MG tablet, Take 1 tablet (750 mg total) by mouth every 12 (twelve) hours, Disp: 60 tablet, Rfl: 0    docusate sodium (COLACE) 100 MG capsule, Take 2 capsules (200 mg total) by mouth 2 (two) times daily, Disp: 60 capsule, Rfl: 0    famotidine (PEPCID) 20 MG tablet, Take 1 tablet (20 mg total) by mouth 2 (two) times daily before meals, Disp: 60 tablet, Rfl: 0    FLUoxetine (PROzac) 10 MG capsule, Take 1 capsule (10 mg total) by mouth every morning, Disp: 30 capsule, Rfl: 0    lactobacillus species (BIO-K PLUS) Capsule Delayed Release capsule, Take 1 capsule (50 Billion CFU total) by mouth daily, Disp: 30 capsule, Rfl: 1    lidocaine (LIDODERM) 5 %, Place 1 patch onto the skin every 24 hours Remove & Discard patch within 12 hours or as directed by MD, Disp: 10 patch, Rfl: 0    metaxalone (SKELAXIN) 800 MG tablet, Take 0.5 tablets (400 mg total) by mouth 3 (three) times daily, Disp: 90 tablet, Rfl: 0    polyethylene glycol (MIRALAX) 17  g packet, Take 17 g by mouth daily as needed (constipation), Disp: 30 each, Rfl: 0    pregabalin (LYRICA) 50 MG capsule, Take 1 capsule (50 mg total) by mouth 3 (three) times daily, Disp: 90 capsule, Rfl: 0      Objective:     Vitals:    07/18/21 1424   BP: 101/61   Pulse: 76   Resp: 16   Temp: 97.2 F (36.2 C)   SpO2: 100%       Review of Systems   Constitutional: Negative.    HENT: Negative.     Eyes: Negative.    Respiratory: Negative.     Cardiovascular: Negative.    Gastrointestinal: Negative.    Endocrine: Negative.    Genitourinary: Negative.    Musculoskeletal: Negative.    Skin: Negative.    Neurological: Negative.    Hematological: Negative.    Psychiatric/Behavioral: Negative.         Physical Exam  Vitals and nursing note reviewed.   Constitutional:       Appearance: Normal appearance.   HENT:      Head: Normocephalic and atraumatic.      Nose: Nose normal.      Mouth/Throat:      Mouth: Mucous membranes are moist.   Eyes:      Extraocular Movements: Extraocular movements intact.      Conjunctiva/sclera: Conjunctivae normal.      Pupils: Pupils are equal, round, and reactive to light.   Cardiovascular:      Rate and Rhythm: Normal rate and regular rhythm.      Pulses: Normal pulses.      Heart sounds: Normal heart sounds.   Pulmonary:      Effort: Pulmonary effort is normal.      Breath sounds: Normal breath sounds.   Abdominal:      General: Abdomen is flat.      Palpations: Abdomen is soft.   Musculoskeletal:  General: Normal range of motion.      Cervical back: Normal range of motion and neck supple.   Skin:     General: Skin is warm and dry.   Neurological:      General: No focal deficit present.      Mental Status: He is alert and oriented to person, place, and time. Mental status is at baseline.   Psychiatric:         Mood and Affect: Mood normal.         Behavior: Behavior normal.         Thought Content: Thought content normal.         Judgment: Judgment normal.       Diagnostic Imaging:    CT Hip Right W WO Contrast    Result Date: 07/07/2021  Normal CT examination of the right hip. ReadingStation:[email or web address]    MRI Lumbar Spine W WO Contrast    Result Date: 06/28/2021  IMPRESSION: Osteomyelitis-discitis at L4-5 is confirmed. No abscess  ReadingStation:[email or web address]    CT Abdomen Pelvis with IV Cont    Result Date: 06/28/2021  No mass lesion identified. Osteomyelitis/discitis at L4-5. Associated circumferential disc bulge is worse. Subtle stranding in the subcutaneous fat over right buttock is new may suggest contusion  ReadingStation:[email or web address]    PICC Placement Double Lumen- [facility]H    Result Date: 07/03/2021  IMPRESSION: Successful fluoroscopic and ultrasound guided PICC line placement. ReadingStation:[email or web address]    CT Guided Asp Disc or Paravertebral    Result Date: 07/02/2021  IMPRESSION: Successful percutaneous CT guided L4-L5 Disc Core Biopsy and Aspiration with moderate sedation. ReadingStation:[email or web address]        Assessment & Plan:     1. Osteomyelitis, unspecified site, unspecified type        2. Lumbar discitis        3. Anxiety        4. Depression, unspecified depression type        5. Hospital discharge follow-up        6. QT prolongation  ECG 12 lead          Plan:  OM/discitis-on cipro 750q12hr; fup w/ ID next week  Anxiety-not on buspar anymore  Depression-wellbutrin 150mg  daily, prozac 10mg  daily  QT prolongation-noted in hospital and on cipro-will get updated EKG to assess    Follow up: w/ me prn  PCP: at [facility] in Nov       [facility], DO  07/18/2021  3:06 PM

## 2021-07-18 ENCOUNTER — Ambulatory Visit (INDEPENDENT_AMBULATORY_CARE_PROVIDER_SITE_OTHER): Payer: PRIVATE HEALTH INSURANCE | Admitting: Internal Medicine

## 2021-07-18 ENCOUNTER — Encounter (INDEPENDENT_AMBULATORY_CARE_PROVIDER_SITE_OTHER): Payer: Self-pay | Admitting: Internal Medicine

## 2021-07-18 VITALS — BP 101/61 | HR 76 | Temp 97.2°F | Resp 16 | Ht 71.0 in | Wt 187.4 lb

## 2021-07-18 DIAGNOSIS — M869 Osteomyelitis, unspecified: Secondary | ICD-10-CM

## 2021-07-18 DIAGNOSIS — M4646 Discitis, unspecified, lumbar region: Secondary | ICD-10-CM

## 2021-07-18 DIAGNOSIS — F419 Anxiety disorder, unspecified: Secondary | ICD-10-CM

## 2021-07-18 DIAGNOSIS — Z09 Encounter for follow-up examination after completed treatment for conditions other than malignant neoplasm: Secondary | ICD-10-CM

## 2021-07-18 DIAGNOSIS — F32A Depression, unspecified: Secondary | ICD-10-CM

## 2021-07-18 DIAGNOSIS — R9431 Abnormal electrocardiogram [ECG] [EKG]: Secondary | ICD-10-CM

## 2021-07-18 NOTE — Patient Instructions (Signed)
Patient educated on importance of medical compliance with all medications and follow up visits. Patient had opportunity to ask questions and understood plan of care.  It was a pleasure taking care of [facility] during this visit.    Follow up plan w/ me prn    Patient will be establishing care w/ PCP at [facility]

## 2021-07-24 NOTE — Progress Notes (Deleted)
Infectious Disease Live Tele-ID OP Progress Note   [facility]  [address] [place] [address]    Patient Name: [facility]  Primary Care Physician:  Hillery Jacks, PA      Date:  07/24/2021      Visit information: No chief complaint on file.  .      Subjective:  This consult was provided via telemedicine using two-way real-time interactive telecommunication technology between the patient and the provider. The Adult nurse includes audio and video. Patient has been seen through the Telemedicine service with the assistance of a local tele-presenter.    Consultant Contact Information: Please call ID Connect Call Center [phone].    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 IR guided biopsy done 10/3 with cultures with Pseudomonas aeruginosa. Treated with 1 week of cefepime prior to transitioning to ciprofloxacin and has stayed in the hospital for Qtc monitoring today after transitioning yesterday.     At time of ID signoff 10/11:  Pseudomonas does have a lower barrier to resistance to cipro but given his past IVDU an oral treatment is the best choice for him. Follow-up imaging is not always needed for spinal osteodiscitis, but given the organism, his chronic pain and social history, I would consider it in this case with the expectation of mild improvement or no change as radiographic resolution will take much longer than 6 weeks. This decision can be made in ID clinic follow-up. If inflammatory markers normalize, this would be reassuring. Recommendation- Continue  ciprofloxacin 750 mg BID. QTC on 10/11 was 429. Per ID sign off note "Please be sure to discuss with [name] that he should not use methamphetamines while on cipro due to Qtc concerns. I attempted to call his room on multiple occasions today and was unsuccessful in reaching him. Discussed with Dr. Sandria Manly who will ensure he understands this risk and is in agreement with abstaining from any meth use. If this is a problem, he will need inpatient IV cefepime for the duration of his course. "    Pt discharged 10/11      ID OP f/u 10/27:  Noted that the pseudomonas was intermediate to levofloxacin though sensitive to cipro. Otherwise pan-S.  Last labs from 10/8: normal/stable CBC and CMP. 9/30 CRP 7.59, ESR 44.    Recommend:  Can continue oral ciprofloxacin 750mg  BID with plan for 6 week course of therapy 10/3 - 11/14. I will order safety monitoring labs today to include CBC with diff, CMP, ESR, CRP. I will order repeat EKG for monitoring.  f/u pathology report  pt needs ID f/u in another 2 weeks, based on labs and symptoms and inflammatory markers can consider whether repeat imaging is needed prior to discontinuation of therapy given lower barrier to resistance possible with oral cipro (chosen to avoid PICC line)     French Ana, MD  Infectious disease Telemedicine  Please call ID Connect call center to reach ID: [phone]      Allergies   Allergen Reactions    Quetiapine Hives  Current Outpatient Medications:     atorvastatin (Lipitor) 40 MG tablet, Take 1 tablet (40 mg total) by mouth daily, Disp: 90 tablet, Rfl: 3    buPROPion XL (WELLBUTRIN XL) 150 MG 24 hr tablet, TAKE 1 TABLET BY MOUTH EVERY DAY, Disp: 90 tablet, Rfl: 1    celecoxib (CeleBREX) 100 MG capsule, Take 1 capsule (100 mg total) by mouth 2 (two) times daily, Disp: 60 capsule, Rfl: 0    ciprofloxacin (CIPRO) 750 MG tablet, Take 1 tablet (750 mg total) by mouth every 12 (twelve) hours, Disp: 60 tablet, Rfl: 0    docusate sodium (COLACE) 100 MG  capsule, Take 2 capsules (200 mg total) by mouth 2 (two) times daily, Disp: 60 capsule, Rfl: 0    famotidine (PEPCID) 20 MG tablet, Take 1 tablet (20 mg total) by mouth 2 (two) times daily before meals, Disp: 60 tablet, Rfl: 0    FLUoxetine (PROzac) 10 MG capsule, Take 1 capsule (10 mg total) by mouth every morning, Disp: 30 capsule, Rfl: 0    lactobacillus species (BIO-K PLUS) Capsule Delayed Release capsule, Take 1 capsule (50 Billion CFU total) by mouth daily, Disp: 30 capsule, Rfl: 1    lidocaine (LIDODERM) 5 %, Place 1 patch onto the skin every 24 hours Remove & Discard patch within 12 hours or as directed by MD, Disp: 10 patch, Rfl: 0    metaxalone (SKELAXIN) 800 MG tablet, Take 0.5 tablets (400 mg total) by mouth 3 (three) times daily, Disp: 90 tablet, Rfl: 0    polyethylene glycol (MIRALAX) 17 g packet, Take 17 g by mouth daily as needed (constipation), Disp: 30 each, Rfl: 0    pregabalin (LYRICA) 50 MG capsule, Take 1 capsule (50 mg total) by mouth 3 (three) times daily, Disp: 90 capsule, Rfl: 0      Objective     There were no vitals taken for this visit.    Dosing Wt:  Wt Readings from Last 1 Encounters:   07/18/21 85 kg (187 lb 6.4 oz)   ,  ZOX:WRUEA is no height or weight on file to calculate BMI.    1) Constitutional:   2) Eyes:   3) ENMT:   4) Neck:   5) Respiratory:   6) Cardiovascular:   7) Gastrointestinal:   8) Extremities:   9) Musculoskeletal:   10) Neurological:   11) Psychiatric:  12) Skin:  13) SBE:       Labs Reviewed    CBC:   Lab Results   Component Value Date/Time    WBC 10.3 07/06/2021 04:24 AM    RBC 4.38 07/06/2021 04:24 AM    HGB 11.3 (L) 07/06/2021 04:24 AM    HCT 35.6 (L) 07/06/2021 04:24 AM    MCV 81 07/06/2021 04:24 AM    PLT 439 07/06/2021 04:24 AM       CMP:  Lab Results   Component Value Date/Time    NA 136 07/09/2021 07:24 AM    K 4.1 07/09/2021 07:24 AM    CL 102 07/09/2021 07:24 AM    CO2 25.1 07/09/2021 07:24 AM    CA 9.5 07/09/2021 07:24 AM    GLU 114 (H) 07/09/2021 07:24  AM    CREAT 0.65 (L) 07/09/2021 07:24 AM    BUN 21 07/09/2021 07:24 AM    PROT 7.3 07/06/2021 04:24 AM    ALB 2.9 (L) 07/06/2021 04:24 AM    ALKPHOS 54 07/06/2021 04:24 AM    ALT 48 07/06/2021 04:24 AM  AST 28 07/06/2021 04:24 AM    BILITOTAL 0.4 07/06/2021 04:24 AM    AGRATIO 0.66 (L) 07/06/2021 04:24 AM    ANIONGAP 13.0 07/09/2021 07:24 AM    EGFR 129 07/09/2021 07:24 AM    EGFR 124 10/28/2018 12:00 AM    GLOB 4.4 (H) 07/06/2021 04:24 AM       CPK: No results found for: CK    CRP:   Lab Results   Component Value Date/Time    CRP 7.59 (H) 06/28/2021 03:10 AM       ESR:   Lab Results   Component Value Date/Time    ESR 44 (H) 06/28/2021 03:10 AM       Vancomycin level: No results found for: VANCOMYCIN    Microbiology Results (last 15 days)       ** No results found for the last 360 hours. **            Radiology:    CT Hip Right W WO Contrast  Narrative: Clinical History:  pain in the setting of discitis    Ordering Comments:   None.      Study Notes:   Possible infection      Examination:  CT HIP RIGHT W WO CONTRAST      All CT scans are performed using dose optimization techniques as appropriate to the performed exam, including automated exposure control (AEC) and iterative reconstruction.     CT DOSE:  CTDIvol Mean: 30.94, mGy/DLP: 1881.25, mGy.cm.    Comparison:  CT abdomen pelvis 06/28/2021    Findings:  Right hip joint space is preserved. No effusion is identified.    Muscles around the right hip is normal.    There are no abnormal fluid collections around the right hip or proximal thigh.    Normal appearance to the image aspect of the right SI joint.  Impression: Normal CT examination of the right hip.    ReadingStation:[id number]               Simona Huh, MD  07/24/2021 1:28 PM  MRN: [id number]                                     CSN: [id number]

## 2021-07-25 ENCOUNTER — Ambulatory Visit (INDEPENDENT_AMBULATORY_CARE_PROVIDER_SITE_OTHER): Payer: PRIVATE HEALTH INSURANCE

## 2021-07-30 ENCOUNTER — Inpatient Hospital Stay (INDEPENDENT_AMBULATORY_CARE_PROVIDER_SITE_OTHER): Payer: PRIVATE HEALTH INSURANCE | Admitting: Physician Assistant

## 2021-07-30 LAB — VH FUNGAL OTHER AND SMEAR: Fungal Smear: NONE SEEN

## 2021-07-30 NOTE — Progress Notes (Unsigned)
[facility]  [address]  [place], [place], [address]  [phone]  07/30/2021       Patient:   Clayton Davis                                                  CSN:        [id number]                                          DOB:       1990-12-11                                                    MRN:        [id number]     SUBJECTIVE     HPI: Patient is a 30 y.o. male who comes in for hospital f/u.  Patient established with me as PCP 01/14/2021, prior to that was managed by Jenel Lucks, NP  Other medical providers/specialists: Psychiatrist  Lives with: Wife  Occupation: Network engineer of [employer]    Current concerns:  Fall / Back injury / IVDU  Seen in the ER 05/18/21:    Normal xrays.  Lumbar contusion and strain.  R shoulder contusion and strain.  D/c on Acetaminophen, IBU, Lidocaine patches.  Seen in the ER 8/28-29/22:  Worsening back pain impacting mobility.   D/c on Hydrocodone-Acetaminophen, Medrol dose pak.   Pt has *** f/u with [facility] as recommended.  Seen leaving ER 05/30/21 with PIV placed by EMS in place, instructed to return.  Admitted to the hospital 9/30-10/11/22:   L4-5 discitis/osteomyelitis. Culture + Pseudomonas.  Tx IV abx, d/c PO abx.   Qtc prolongation.  Need outpt EKG repeat.  Pt advised to avoid methamphetamine especially when on Cipro.  Pt had f/u at [facility] 07/18/21.  No complaints at that time.  EKG ordered but not done.  Current condition since d/c: ***    Chronic conditions not specifically addressed at today's visit, will address at future follow-up appointments:  H/o Pulmonary Embolism  Oct 2021 - After recent hospitalization for Covid19, no h/o blood clots prior to this, no FHx bleeding/clotting disorder.  Was on Eliquis (apixaban) 5mg  PO Q12hrs, ran out in Jan/Feb 2022.  Weight gain / Fatigue / Decreased Libido  Gradual  over the past couple of years  Depression / Anxiety / ADHD  Pt follows with Psychiatry.  Current Medications: Xanax, Adderall, Wellbutrin, BuSpar, Prozac.    Health Maintenance not comprehensively addressed today due to comprehensively addressed within the past 12months (01/14/21).  Will plan to address at annual physical.  Last labs: Oct 2021  Last HIV/Syphilis/Hepatitis C screenings: Negative in the past  Tdap: Up-to-date (adm. 2017)  Flu:  Declined  Covid19 Vaccine: Up-to-date  Diet: Meat, Seafood, Vegetable, Starch, Tries to limit sugar intake  Drinks a lot of milk, but is cutting back to reduce gassiness  Water: Good and bad days  Caffeine: Tries to limit, only zero-calorie sodas.  Alcohol: None  Tobacco: Vapes daily   Drugs: No  Exercise: Stays active, "not a gym rat"  Sleep: does not have trouble falling asleep or staying asleep.  Sexually Active: Yes (monogamous with male partner)    Past Surgical History:   Procedure Laterality Date    NO PAST SURGERIES       Family History   Problem Relation Age of Onset    No known problems Mother     No known problems Father     No known problems Sister     No known problems Brother     No known problems Maternal Aunt     No known problems Maternal Uncle     No known problems Paternal Aunt     No known problems Paternal Uncle     Cancer Maternal Grandmother     Diabetes Maternal Grandfather     No known problems Paternal Grandmother     No known problems Paternal Grandfather      Social History     Social History Narrative    Not on file     No outpatient medications have been marked as taking for the 07/30/21 encounter (Appointment) with Hillery Jacks, PA.     Allergies   Allergen Reactions    Quetiapine Hives     Review of Systems   Constitutional:  Negative for appetite change, chills, fatigue and fever.   HENT:  Negative for congestion, ear pain, sinus pressure, sneezing, sore throat and trouble swallowing.    Eyes:  Negative for pain.   Respiratory:   Negative for cough and shortness of breath.    Cardiovascular:  Negative for chest pain, palpitations and leg swelling.   Gastrointestinal:  Negative for abdominal pain, diarrhea, nausea and vomiting.   Genitourinary:  Negative for difficulty urinating and dysuria.   Musculoskeletal:  Negative for arthralgias and myalgias.   Skin:  Negative for rash.   Neurological:  Negative for seizures, syncope and headaches.     PHYSICAL EXAM     There were no vitals taken for this visit.  Physical Exam  Vitals and nursing note reviewed.   Constitutional:       General: He is not in acute distress.     Appearance: Normal appearance. He is obese. He is not ill-appearing, toxic-appearing or diaphoretic.   HENT:      Head: Normocephalic and atraumatic.      Right Ear: External ear normal.      Left Ear: External ear normal.      Nose: No congestion.      Mouth/Throat:      Mouth: Mucous membranes are moist.   Eyes:      Conjunctiva/sclera: Conjunctivae normal.   Cardiovascular:      Rate and Rhythm: Normal rate and regular rhythm.      Pulses: Normal pulses.      Heart sounds: Normal heart sounds. No murmur heard.    No friction rub. No gallop.   Pulmonary:      Effort: Pulmonary effort is normal. No respiratory distress.      Breath sounds: Normal breath sounds. No wheezing, rhonchi or rales.   Abdominal:      General: Bowel sounds are normal.      Palpations: Abdomen is soft.      Tenderness: There is no abdominal tenderness. There is no right CVA tenderness or left CVA tenderness.   Musculoskeletal:      Cervical back: Normal range of motion and neck supple. No rigidity or tenderness.  Right lower leg: No edema.      Left lower leg: No edema.      Comments: Patient ambulates independently without assistive device, without obvious deformity/deficit of extremities x4.   Lymphadenopathy:      Cervical: No cervical adenopathy.   Skin:     General: Skin is warm and dry.      Findings: No rash.   Neurological:      Mental Status:  He is alert and oriented to person, place, and time.      Sensory: No sensory deficit.      Motor: No weakness.      Coordination: Coordination normal.      Gait: Gait normal.   Psychiatric:         Mood and Affect: Mood normal.         Behavior: Behavior normal.         Thought Content: Thought content normal.         Judgment: Judgment normal.     ASSESSMENT and PLAN     No diagnosis found.    Previous Records reviewed as available.  Lifestyle recommendations: Continue to decrease lactose intake, due to direct association with gassiness/GI upset; discussed using Lactaid when patient is unable to avoid lactose consumption.  Medication changes:   START n/a   CONTINUE Xanax, Wellbutrin, Adderall, BuSpar, Prozac as prescribed by psychiatrist   STOP n/a  Labs/Tests ordered: As above  Referrals: None today, continue regular follow-up with psychiatrist.  Counseled on likelihood that weight gain, decreased libido, fatigue could be side effects of one or multiple mental health medications, and patient should discuss this possibility with his psychiatrist.  Follow up in 1 year(s), sooner if needed.    Hillery Jacks, PA-C    This note was completed using dragon medical speech recognition software. Grammatical errors, random word insertions, pronoun errors, incorrect word insertion, misspellings  and incomplete sentences are occasional consequences of this technology due to software limitations. If there are questions or concerns about the content of this note or information contained within the body of this dictation they should be addressed with the provider for clarification.

## 2021-07-31 ENCOUNTER — Encounter (INDEPENDENT_AMBULATORY_CARE_PROVIDER_SITE_OTHER): Payer: Self-pay

## 2021-08-05 ENCOUNTER — Other Ambulatory Visit (INDEPENDENT_AMBULATORY_CARE_PROVIDER_SITE_OTHER): Payer: Self-pay

## 2021-08-05 DIAGNOSIS — M869 Osteomyelitis, unspecified: Secondary | ICD-10-CM

## 2021-08-05 DIAGNOSIS — M4646 Discitis, unspecified, lumbar region: Secondary | ICD-10-CM

## 2021-08-06 ENCOUNTER — Encounter (INDEPENDENT_AMBULATORY_CARE_PROVIDER_SITE_OTHER): Payer: Self-pay

## 2021-08-06 ENCOUNTER — Ambulatory Visit (INDEPENDENT_AMBULATORY_CARE_PROVIDER_SITE_OTHER): Payer: PRIVATE HEALTH INSURANCE | Admitting: Internal Medicine

## 2021-08-06 DIAGNOSIS — M869 Osteomyelitis, unspecified: Secondary | ICD-10-CM

## 2021-08-06 MED ORDER — CIPROFLOXACIN HCL 750 MG PO TABS
750.0000 mg | ORAL_TABLET | Freq: Two times a day (BID) | ORAL | 0 refills | Status: AC
Start: 2021-08-06 — End: 2021-08-20

## 2021-08-06 NOTE — Progress Notes (Signed)
Infectious Disease Live Tele-ID OP Progress Note   V[facility]  [address] [place] [address]    Patient Name: [facility] [place]  Primary Care Physician:  Hillery Jacks, PA      Date:  08/06/2021      Visit information: f/u      Subjective:  This consult was provided via telemedicine using two-way real-time interactive telecommunication technology between the patient and the provider. The Adult nurse includes audio and video. Patient has been seen through the Telemedicine service with the assistance of a local tele-presenter.    Consultant Contact Information: Please call ID Connect Call Center [phone].    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 IR guided biopsy done 10/3 with cultures with Pseudomonas aeruginosa.      Treated with 1 week of cefepime prior to transitioning to ciprofloxacin and has stayed in the hospital for Qtc monitoring today after transitioning yesterday.   Pseudomonas does have a lower barrier to resistance to cipro but given his past IVDU an oral treatment is the best choice for him. Follow-up imaging is not always needed for spinal osteodiscitis, but given the organism, his chronic pain and social history, I would consider it in this case with the expectation of mild improvement or no change as radiographic resolution will take much longer than 6 weeks. This decision can be made in ID clinic follow-up. If inflammatory markers normalize, this would be reassuring.   The plan was to ciprofloxacin 750 mg BID for at least 6 week  with outpt id f/u.           ID OP f/u 08/06/2021 Barton Fanny):  Patient reports feeling better: No fevers, no chills, improved appetite.  Low back pain is improving and currently he is able to ambulate and this morning was able to stand up without any support.  Has been tolerating ciprofloxacin fine.  Reports being compliant did not miss any doses.  Denies recreational drug use.  Exam is reassuring. Repeat labs pending.    Currently the plan is to repeat labs including inflammatory markers.  If labs are down to normal given clinical improvement I will stop antibiotics.  If they are still elevated, might extend the course of ciprofloxacin.  Discussed with the patient since there was no epidural abscess on initial MRI and clinically he is doing better and, there is no need for repeat MRI at that point.  The plan may change depending on clinical course    Recommendations:    CBC with differential, ESR, CRP, CMP  Extend ciprofloxacin 750 mg p.o. twice daily for 2 more weeks.  Prescription sent to the pharmacy  Final duration of therapy to be determined after labs are back    Plan of care discussed with the patient at length, all questions answered to the best of my knowledge.    Rodrigo Ran, MD/PhD      Allergies   Allergen Reactions    Quetiapine Hives         Current  Outpatient Medications:     atorvastatin (Lipitor) 40 MG tablet, Take 1 tablet (40 mg total) by mouth daily, Disp: 90 tablet, Rfl: 3    buPROPion XL (WELLBUTRIN XL) 150 MG 24 hr tablet, TAKE 1 TABLET BY MOUTH EVERY DAY, Disp: 90 tablet, Rfl: 1    celecoxib (CeleBREX) 100 MG capsule, Take 1 capsule (100 mg total) by mouth 2 (two) times daily, Disp: 60 capsule, Rfl: 0    ciprofloxacin (CIPRO) 750 MG tablet, Take 1 tablet (750 mg total) by mouth every 12 (twelve) hours, Disp: 60 tablet, Rfl: 0    docusate sodium (COLACE) 100 MG capsule, Take 2 capsules (200 mg total) by mouth 2 (two) times daily, Disp: 60 capsule, Rfl: 0    famotidine (PEPCID) 20 MG tablet,  Take 1 tablet (20 mg total) by mouth 2 (two) times daily before meals, Disp: 60 tablet, Rfl: 0    FLUoxetine (PROzac) 10 MG capsule, Take 1 capsule (10 mg total) by mouth every morning, Disp: 30 capsule, Rfl: 0    lactobacillus species (BIO-K PLUS) Capsule Delayed Release capsule, Take 1 capsule (50 Billion CFU total) by mouth daily, Disp: 30 capsule, Rfl: 1    lidocaine (LIDODERM) 5 %, Place 1 patch onto the skin every 24 hours Remove & Discard patch within 12 hours or as directed by MD, Disp: 10 patch, Rfl: 0    metaxalone (SKELAXIN) 800 MG tablet, Take 0.5 tablets (400 mg total) by mouth 3 (three) times daily, Disp: 90 tablet, Rfl: 0    polyethylene glycol (MIRALAX) 17 g packet, Take 17 g by mouth daily as needed (constipation), Disp: 30 each, Rfl: 0    pregabalin (LYRICA) 50 MG capsule, Take 1 capsule (50 mg total) by mouth 3 (three) times daily, Disp: 90 capsule, Rfl: 0      Objective     Dosing Wt:  Wt Readings from Last 1 Encounters:   07/18/21 85 kg (187 lb 6.4 oz)   ,  ZOX:WRUEA is no height or weight on file to calculate BMI.    Very pleasant, no acute distress  Wife Madeline at the bedside  Conjunctiva: No jaundice  Breathing: Nonlabored  Mild paraspinal tenderness over the lumbar spine  No point tenderness of the spine  Skin: No rashes  Able to stand up without support    Labs Reviewed    CBC:   Lab Results   Component Value Date/Time    WBC 10.3 07/06/2021 04:24 AM    RBC 4.38 07/06/2021 04:24 AM    HGB 11.3 (L) 07/06/2021 04:24 AM    HCT 35.6 (L) 07/06/2021 04:24 AM    MCV 81 07/06/2021 04:24 AM    PLT 439 07/06/2021 04:24 AM       CMP:  Lab Results   Component Value Date/Time    NA 136 07/09/2021 07:24 AM    K 4.1 07/09/2021 07:24 AM    CL 102 07/09/2021 07:24 AM    CO2 25.1 07/09/2021 07:24 AM    CA 9.5 07/09/2021 07:24 AM    GLU 114 (H) 07/09/2021 07:24 AM    CREAT 0.65 (L) 07/09/2021 07:24 AM    BUN 21 07/09/2021 07:24 AM    PROT 7.3 07/06/2021 04:24 AM    ALB 2.9 (L) 07/06/2021 04:24 AM    ALKPHOS  54 07/06/2021 04:24 AM    ALT 48 07/06/2021 04:24 AM    AST 28 07/06/2021 04:24 AM    BILITOTAL 0.4 07/06/2021 04:24 AM  AGRATIO 0.66 (L) 07/06/2021 04:24 AM    ANIONGAP 13.0 07/09/2021 07:24 AM    EGFR 129 07/09/2021 07:24 AM    EGFR 124 10/28/2018 12:00 AM    GLOB 4.4 (H) 07/06/2021 04:24 AM       CPK: No results found for: CK    CRP:   Lab Results   Component Value Date/Time    CRP 7.59 (H) 06/28/2021 03:10 AM       ESR:   Lab Results   Component Value Date/Time    ESR 44 (H) 06/28/2021 03:10 AM       Vancomycin level: No results found for: VANCOMYCIN    Microbiology Results (last 15 days)       ** No results found for the last 360 hours. **            Radiology:    CT Hip Right W WO Contrast  Narrative: Clinical History:  pain in the setting of discitis    Ordering Comments:   None.      Study Notes:   Possible infection      Examination:  CT HIP RIGHT W WO CONTRAST      All CT scans are performed using dose optimization techniques as appropriate to the performed exam, including automated exposure control (AEC) and iterative reconstruction.     CT DOSE:  CTDIvol Mean: 30.94, mGy/DLP: 1881.25, mGy.cm.    Comparison:  CT abdomen pelvis 06/28/2021    Findings:  Right hip joint space is preserved. No effusion is identified.    Muscles around the right hip is normal.    There are no abnormal fluid collections around the right hip or proximal thigh.    Normal appearance to the image aspect of the right SI joint.  Impression: Normal CT examination of the right hip.    ReadingStation:[id number]               Rodrigo Ran, MD  08/06/2021 12:08 AM  MRN: [id number]                                     CSN: [id number]

## 2021-08-06 NOTE — Progress Notes (Signed)
The patient's informed consent to perform this consultation using telehealth tools was obtained: Yes    Telehealth patient education given prior to telehealth session: Yes    Patient was seen via telehealth by Dr. Tera Partridge Time: 1556  End Time:   1614    Staff present during the patient's telehealth session:   Lorelee Market, RN, MSN, CTCP     Plan of care was reviewed with patient, verbalized understanding.

## 2021-08-07 ENCOUNTER — Telehealth (INDEPENDENT_AMBULATORY_CARE_PROVIDER_SITE_OTHER): Payer: Self-pay

## 2021-08-07 NOTE — Telephone Encounter (Signed)
Patient has been scheduled with Karoline Caldwell PA on September 16, 2021 at 4:00 pm for primary care. Attempted to reach patient to give the above information but received patients voicemail. All information was left on machine with office call back number with any questions. A letter will be mailed out as well with all the above information.

## 2021-09-16 ENCOUNTER — Ambulatory Visit (INDEPENDENT_AMBULATORY_CARE_PROVIDER_SITE_OTHER): Payer: PRIVATE HEALTH INSURANCE | Admitting: Physician Assistant

## 2021-09-16 NOTE — Progress Notes (Deleted)
[facility]  [address]  [place], [place], [address]  [phone]  09/16/2021       Patient:   Clayton Davis                                                  CSN:        [id number]                                          DOB:       1991/08/19                                                    MRN:        [id number]     SUBJECTIVE     HPI: Patient is a 30 y.o. male who comes in to establish care for current concerns/chronic conditions below.  Previous PCP: [facility]  Other medical providers/specialists: ***  Lives with: ***  Occupation: ***    Current Concerns / Chronic Conditions: ***  Osteomyelitis  Hospitalized Sept/Oct 2022.  L4-5 discitis with intramuscular involvement of bilateral psoas.  Biopsy +Pseudomonas aeruginosa.  Patient is *** following with infectious disease    Substance use disorder  Pt *** current use (***).  Pt is *** currently on methadone, managed by ***.    Anxiety / Mood disorder  Patient has been stable taking Wellbutrin 150mg /day, Prozac 10mg /day*** for {numbers; 0-10:33138} {time units:11}.  Patient does *** see psychiatry.  Patient is *** seeing a counselor/therapist.  Current symptoms: ***  Side effects reported: None***  Patient reports: ***  Patient denies: SI/HI***  Pertinent PMH: ***  Mental health questionnaires done 09/16/21  PHQ-9: *** (*** depression)  Beck Anxiety Score: *** (*** anxiety)  MDQ: Section 1 (*** yes responses) Section 2 (***) Section 3 (*** problem)    Anemia  Pt is *** taking *** supplemental.  Vitamin B-12   Date Value Ref Range Status   06/29/2021 745 213 - 816 pg/mL Final     Comment:     Further testing is suggested for symptomatic patients with B12 Levels between 100-300 pg/mL for hematologic abnormalities, and between 100-400 pg/mL for neurological abnormalities.    The above 2 analytes were performed  by [facility] [phone])  [address],[place] [address]       Folate   Date Value Ref Range Status   06/29/2021 11.0 7.0 - 19.9 ng/mL Final     Comment:     Folate less than 3.5 ng/mL = considered Folate deficient  Folate 3.5-7.0 ng/mL = borderline levels       Iron   Date Value Ref Range Status   06/29/2021 27.2 (L) 50.0 - 175.0 mcg/dL Final     Hemoglobin   Date Value Ref Range Status   07/06/2021 11.3 (L) 13.0 - 17.5 gm/dL Final     Hematocrit   Date Value Ref Range Status   07/06/2021 35.6 (L) 39.0 - 52.5 % Final     Hyperlipidemia  Patient is taking Atorvastatin 40mg /day to manage  cholesterol.     Patient {DOES/DOES NOT:21158:o} exercise (***).  Patient {HAS HAS NOT:23168} been trying to eat a healthy diet.   Cholesterol   Date Value Ref Range Status   01/23/2021 217 (H) 75 - 199 mg/dL Final     Triglycerides   Date Value Ref Range Status   01/23/2021 181 (H) 10 - 150 mg/dL Final     HDL   Date Value Ref Range Status   01/23/2021 33 (L) 40 - 55 mg/dL Final     LDL Calculated   Date Value Ref Range Status   01/23/2021 148 mg/dL Final     VLDL   Date Value Ref Range Status   01/23/2021 36 0 - 40 Final     Comment:     Lipid Panel Interpretive Comment:  *Triglycerides >400 mg/dL Invalidates LDL calculation.  *Normal Values for HDL valid only for patients over 59 years of age   HDL levels less than 40 mg/dL are a positive risk factor for Coronary Heart Disease.   HDL levels over 60 mg/dL are a negative risk factor for Coronary Heart Disease  *LDL Interpretation     Optimal                          <100 mg/dL     Near Optimal/Above Optimal  100 - 129 mg/dL     Borderline High             130 - 159 mg/dL     High                        160 - 189 mg/dL     Very High                        >189 mg/dL  *Coronary Heart Disease Risk:    (Chol/HDL)     Male       Male     1/2 Average   3.43        3.27     Average       4.94        4.44     2X Average    9.55        7.05     3X Average    23.99       11.04  The above 6 analytes were performed by [facility] [phone])  [address],[place] [address]     72yr ASCVD: ***%    Health Maintenance not comprehensively addressed today due to time constraints.  Will plan to address at next visit or annual physical.    Past Surgical History:   Procedure Laterality Date    NO PAST SURGERIES       Family History   Problem Relation Age of Onset    No known problems Mother     No known problems Father     No known problems Sister     No known problems Brother     No known problems Maternal Aunt     No known problems Maternal Uncle     No known problems Paternal Aunt     No known problems Paternal Uncle     Cancer Maternal Grandmother     Diabetes Maternal Grandfather     No known problems Paternal Grandmother     No known problems Paternal Actor  Social History     Social History Narrative    Not on file     No outpatient medications have been marked as taking for the 09/16/21 encounter (Appointment) with Hillery Jacks, PA.     Allergies   Allergen Reactions    Quetiapine Hives       Review of Systems    PHYSICAL EXAM     There were no vitals taken for this visit.    Physical Exam    ASSESSMENT and PLAN     No diagnosis found.    Previous Records reviewed.  Lifestyle recommendations: ***  Medication changes: START *** CONTINUE *** STOP ***   Labs/Tests ordered:   Referrals: ***  Counseled on ***  Follow up {follow up:15908}, sooner if needed.    Hillery Jacks, PA-C    This note was completed using dragon medical speech recognition software. Grammatical errors, random word insertions, pronoun errors, incorrect word insertion, misspellings  and incomplete sentences are occasional consequences of this technology due to software limitations. If there are questions or concerns about the content of this note or information contained within the body of this dictation they should be addressed with the provider for  clarification.

## 2021-10-01 ENCOUNTER — Encounter (RURAL_HEALTH_CENTER): Payer: Self-pay
